# Patient Record
Sex: Female | Born: 1935 | Race: White | Hispanic: No | Smoking: Never smoker
Health system: Southern US, Community
[De-identification: ages and names within clinical notes are randomized; demographics above are authoritative.]

## PROBLEM LIST (undated history)

## (undated) DIAGNOSIS — I4891 Unspecified atrial fibrillation: Secondary | ICD-10-CM

## (undated) DIAGNOSIS — I639 Cerebral infarction, unspecified: Secondary | ICD-10-CM

## (undated) DIAGNOSIS — I10 Essential (primary) hypertension: Secondary | ICD-10-CM

## (undated) HISTORY — PX: OTHER SURGICAL HISTORY: SHX169

---

## 2000-07-13 ENCOUNTER — Encounter: Admission: RE | Admit: 2000-07-13 | Discharge: 2000-07-13 | Payer: Self-pay | Admitting: Internal Medicine

## 2000-07-13 ENCOUNTER — Encounter: Payer: Self-pay | Admitting: Internal Medicine

## 2001-06-23 ENCOUNTER — Ambulatory Visit (HOSPITAL_COMMUNITY): Admission: RE | Admit: 2001-06-23 | Discharge: 2001-06-23 | Payer: Self-pay | Admitting: Internal Medicine

## 2001-07-22 ENCOUNTER — Encounter: Admission: RE | Admit: 2001-07-22 | Discharge: 2001-07-22 | Payer: Self-pay | Admitting: Internal Medicine

## 2001-07-22 ENCOUNTER — Encounter: Payer: Self-pay | Admitting: Internal Medicine

## 2001-08-30 ENCOUNTER — Encounter: Payer: Self-pay | Admitting: *Deleted

## 2001-08-30 ENCOUNTER — Emergency Department (HOSPITAL_COMMUNITY): Admission: EM | Admit: 2001-08-30 | Discharge: 2001-08-30 | Payer: Self-pay | Admitting: *Deleted

## 2003-01-16 ENCOUNTER — Encounter: Payer: Self-pay | Admitting: Internal Medicine

## 2003-01-16 ENCOUNTER — Encounter: Admission: RE | Admit: 2003-01-16 | Discharge: 2003-01-16 | Payer: Self-pay | Admitting: Internal Medicine

## 2003-06-12 ENCOUNTER — Ambulatory Visit (HOSPITAL_COMMUNITY): Admission: RE | Admit: 2003-06-12 | Discharge: 2003-06-12 | Payer: Self-pay | Admitting: Internal Medicine

## 2004-07-02 ENCOUNTER — Encounter: Admission: RE | Admit: 2004-07-02 | Discharge: 2004-07-02 | Payer: Self-pay | Admitting: Internal Medicine

## 2004-08-09 ENCOUNTER — Encounter: Admission: RE | Admit: 2004-08-09 | Discharge: 2004-08-09 | Payer: Self-pay | Admitting: Internal Medicine

## 2005-07-28 ENCOUNTER — Encounter: Admission: RE | Admit: 2005-07-28 | Discharge: 2005-07-28 | Payer: Self-pay | Admitting: Internal Medicine

## 2006-08-17 ENCOUNTER — Encounter: Admission: RE | Admit: 2006-08-17 | Discharge: 2006-08-17 | Payer: Self-pay | Admitting: Internal Medicine

## 2007-08-25 ENCOUNTER — Encounter: Admission: RE | Admit: 2007-08-25 | Discharge: 2007-08-25 | Payer: Self-pay | Admitting: Internal Medicine

## 2007-09-10 ENCOUNTER — Inpatient Hospital Stay (HOSPITAL_COMMUNITY): Admission: EM | Admit: 2007-09-10 | Discharge: 2007-09-13 | Payer: Self-pay | Admitting: Emergency Medicine

## 2008-08-25 ENCOUNTER — Encounter: Admission: RE | Admit: 2008-08-25 | Discharge: 2008-08-25 | Payer: Self-pay | Admitting: Internal Medicine

## 2009-08-02 ENCOUNTER — Encounter (INDEPENDENT_AMBULATORY_CARE_PROVIDER_SITE_OTHER): Payer: Self-pay | Admitting: Internal Medicine

## 2009-08-02 ENCOUNTER — Ambulatory Visit (HOSPITAL_COMMUNITY): Admission: RE | Admit: 2009-08-02 | Discharge: 2009-08-02 | Payer: Self-pay | Admitting: Internal Medicine

## 2011-05-13 NOTE — Discharge Summary (Signed)
Sarah Stone, Sarah Stone              ACCOUNT NO.:  [id number]   MEDICAL RECORD NO.:  [id number]          PATIENT TYPE:  INP   LOCATION:  1602                         FACILITY:  [facility]   PHYSICIAN:  Madelynn Done, MD  DATE OF BIRTH:  [birth year]   DATE OF ADMISSION:  09/10/2007  DATE OF DISCHARGE:  09/13/2007                               DISCHARGE SUMMARY   DISCHARGE DIAGNOSES:  1. Right distal radius fracture.  2. Hypertension.  3. Neurofibromatosis.   ADMITTING DIAGNOSES:  1. Right distal radius fracture.  2. Hypertension.  3. Neurofibromatosis.   PROCEDURES AND DATES:  Open treatment of right distal radius fracture  with internal fixation on 09/11/07.   DISCHARGE MEDICATIONS:  1. Vicodin 5/500 one to two tablets orally q. 4 to 6 h as needed for      pain.  2. Colace 100 mg orally b.i.d..  3. Multivitamin 1 tablet orally daily.   REASON FOR ADMISSION:  The patient is an 77 year old, right-hand  dominant female who sustained a fall onto her outstretched right wrist  on 09/10/07. The patient was seen in the emergency department and  underwent closed manipulation of her right wrist. Due to the instability  of the fracture and persistent displacement, the patient was admitted to  the hospital to undergo surgery on 09/11/07.   HOSPITAL COURSE:  The patient was admitted to the hospital for planned  surgery. The patient underwent surgery on 09/11/07. She tolerated this  well under general anesthesia.   The patient was seen and examined throughout her hospital course. She  remained afebrile. Her vital signs were stable and normal. She was  tolerating a regular diet. Her pain was controlled on oral pain  medications. She was voiding well. She did not have any acute signs  prior to discharge.   On the day of discharge the patient was afebrile. Her vital signs were  stable, normal and felt ready to be discharged to home.   CONDITION ON DISCHARGE:  Good.   RECOMMENDATIONS AND  DISPOSITION:  She is going to be followed up in the  office in approximately 10 days. She is to keep her splint on at all  times, keep her hand elevated above her heart. She is instructed to call  the office and make a followup appointment. I spoke to her son-in-law  today about the arrangements. Will set up home health services for gait  assistance as well as home aid and  OT therapy.   She is instructed to return to clinic sooner if she has fever, chills,  nausea, vomiting, worsening pain or problems with that right wrist. All  questions were addressed.      Madelynn Done, MD  Electronically Signed     FWO/MEDQ  D:  09/13/2007  T:  09/14/2007  Job:  [id number]

## 2011-05-13 NOTE — Op Note (Signed)
Sarah Stone, Sarah Stone              ACCOUNT NO.:  [id number]   MEDICAL RECORD NO.:  [id number]          PATIENT TYPE:  INP   LOCATION:  1602                         FACILITY:  [facility]   PHYSICIAN:  Sarah Done, MD  DATE OF BIRTH:  [birth year]   DATE OF PROCEDURE:  09/11/2007  DATE OF DISCHARGE:                               OPERATIVE REPORT   PREOPERATIVE DIAGNOSES:  1. Right displaced distal radius fracture.  2. Right ulnar styloid fracture.  3. Neurofibromatosis.   POSTOPERATIVE DIAGNOSES:  1. Right displaced distal radius fracture.  2. Right ulnar styloid fracture.  3. Neurofibromatosis.   ATTENDING SURGEON:  Sarah Stone, M.D. who was scrubbed and  present for the entire procedure.   ASSISTANT SURGEON:  None.   PROCEDURE:  1. Open treatment of right displaced distal radius fracture of three      or more fragments with internal fixation.  2. Closed treatment of right ulnar styloid fracture.  3. Radiograph, three views of right wrist.   SURGICAL IMPLANTS:  1. [employer] volar plate with locking pegs distally and three      3.5 bicortical screws proximally.  2. 5 mL of Grafton demineralized bone matrix bone substitute.   ANESTHESIA:  General via LMA.   TOURNIQUET TIME:  70 minutes at  250 mmHg.   DRAINS:  One #7 TLS drain.   SURGICAL INDICATIONS:  Sarah Stone is an 77 year old right-hand-  dominant female who is otherwise healthy with neurofibromatosis and  hypertension who fell on her right wrist yesterday.  I saw and evaluated  the patient in the emergency department and performed a closed  manipulation of her right wrist.  This was unsuccessful in maintaining  reduction, given the instability in dorsal comminution.  After post-  reduction showed continued displacement and loss of radial height as  well as disruption in the distal radioulnar joint, the patient was  consented for surgery.  The risks, benefits and alternatives were  discussed in  detail with the patient, and signed informed consent was  obtained on the day of surgery.   Preoperative note was Stone prior to surgery, outlining the risks,  benefits, and alternatives of surgery.   DESCRIPTION OF PROCEDURE:  The patient was properly identified in the  preoperative holding area.  A mark with a permanent marker was made on  the right wrist to indicate the correct operative site.  The patient was  then brought back to the operating room, placed supine on the operating  room table, and general anesthesia was administered via LMA.  The  patient tolerated this well.  The patient received preoperative  antibiotics prior to any skin incisions.  A well-padded tourniquet was  then placed on the right brachium and sealed with a 1000 drape.  The  right upper extremity was then prepped and draped in sterile fashion.  A  timeout was called, the correct side was identified, and the surgical  procedure was then begun.   Because the patient did have multiple neurofibromas on her right upper  extremity, a curvilinear incision was then made on the  forearm to avoid  the neurofibromas directly over the FCR tendon sheath.  The limb was  then elevated using Esmarch exsanguination and the tourniquet  insufflated to 250 mmHg.  A standard FCR approach was used to the distal  radius.  Dissection was carried down through the skin and subcutaneous  tissue, and the FCR tendon was then identified.  The finger traps in 10  pounds of traction were used to aid in the exposure as well as the  reduction.  The FCR sheath was then opened proximally and distally.  The  FPL tendon was then identified and retracted.  Dissection was carried  down through the pronator quadratus.  There was very good healthy tissue  of the pronator quadratus, and a flap was not raised as the entire  fracture site was exposed.  After exposure of the fracture, a portion of  the brachial radialis were then released off the radial  styloid all the  way until exposure of the first dorsal compartment tendons.  After the  tenotomy of the brachial radialis, the fracture site was then opened up  with a Therapist, nutritional and then 5 mL of demineralized bone matrix were  then packed dorsally into the defect.  There was felt to be good  placement of the bone graft in the defect.  After the bone grafting was  then completed, an open reduction was then achieved.  There was good  alignment after open reduction and good alignment of the volar cortices.  The standard right distal [employer] distal radius plate was then  applied.  It was then held temporarily in place with a K-wire distally  as well as one 3.5 screw proximally in the oblong hole.  Its position  was then confirmed using the mini C-arm and felt to be in adequate  position, with good restoration of their radial height, length, and  tilt.  After the temporary fixation was in place, then attention was  then turned to placement of the distal locking pegs.  Attention was then  first turned  ulnarly where the ulnar-sided pegs were then placed after  the appropriate drill and measurement.  All the peg holes were then  filled distally.  After placement of all the pegs, attention was then  turned up proximally where two more 3.5 bicortical screws were then  placed.  The traction was let off the hand prior to placement of the 3.5  bicortical locking screws.  After placement of all the implants, the  wound was then thoroughly irrigated.  What was left over of the bone  graft material was then packed around the radial styloid and then  dorsally.  Final mini C-arm images were then used to confirm the  reduction.  Live fluoroscopy was then used and the wrist was placed  through a range of motion with good stability.  The tourniquet was then  deflated.  Hemostasis was then obtained with direct pressure.  There was  not any significant arterial bleeding.  The wound was then  thoroughly  irrigated once again.  A TLS drain was then placed in the deep soft  tissues.  The subcutaneous tissues were then closed with a 4-0 Monocryl.  The skin was then closed with 4-0 horizontal mattress nylon sutures.  After fixation of the distal radius, attention was then turned to the  distal radioulnar joint.  The wrist was then placed in neutral  supination and pronation, and the distal radioulnar joint was assessed.  There was  not any gross instability in neutral supination or pronation,  but they were felt to be a more stable in slight supination.  It was  there felt to immobilize the wrist and forearm in supination.   Adaptic dressing was then applied over the wound.  A sterile compressive  dressing was then applied.  20 mL of 0.25% Marcaine were infiltrated  into the wound for local anesthesia.  The patient was then placed in a  well-padded sugar-tong splint to immobilize in slight supination.  The  patient was then extubated and taken to the recovery room in good  condition.   INTRAOPERATIVE FINDINGS:  The patient did have a displaced distal radius  fracture that was felt amenable to open reduction and internal fixation.  She had relatively good bone quality.  After fixation of distal radius,  the distal radioulnar joint was assessed and there was not any gross  instability in supination and neutral pronation but fell more stable in  slight supination.   Intraoperative radiographs, three views of the right wrist, were  obtained which do show the volar fixation in place.  There did not  appear to be any penetration in the articular surface, with overall good  alignment of the plate as well as restoration of the radial height,  inclination, and tilt.   POSTOPERATIVE PLAN:  The patient will be admitted overnight for  antibiotics and pain control.  She will be seen back in the office in 10  days to look at her wound and likely suture removal and will then go  back into a  long-arm immobilization, keeping the forearm in slight  supination.  Then we will see her back at the 3-week mark, and x-rays  out of the splint and likely transition to a short-arm splint for total  immobilization for 6 weeks and then likely x-rays at the 6-week mark and  then likely out of the splint and then down to therapy to begin some  motion.      Sarah Done, MD  Electronically Signed     FWO/MEDQ  D:  09/11/2007  T:  09/12/2007  Job:  [id number] [id number]

## 2011-10-10 LAB — DIFFERENTIAL
Basophils Relative: 0
Lymphocytes Relative: 11 — ABNORMAL LOW
Lymphs Abs: 1
Monocytes Absolute: 0.4
Monocytes Relative: 4
Neutro Abs: 7.8 — ABNORMAL HIGH
Neutrophils Relative %: 85 — ABNORMAL HIGH

## 2011-10-10 LAB — PROTIME-INR
INR: 1.1
Prothrombin Time: 14

## 2011-10-10 LAB — BASIC METABOLIC PANEL
Calcium: 9
Creatinine, Ser: 0.61
GFR calc Af Amer: >60
GFR calc non Af Amer: >60
Sodium: 137

## 2011-10-10 LAB — CBC
Hemoglobin: 12.4
RBC: 3.82 — ABNORMAL LOW
WBC: 9.2

## 2011-10-10 LAB — APTT: aPTT: 29

## 2015-02-23 ENCOUNTER — Emergency Department (HOSPITAL_COMMUNITY): Payer: Medicare Other

## 2015-02-23 ENCOUNTER — Inpatient Hospital Stay (HOSPITAL_COMMUNITY)
Admission: EM | Admit: 2015-02-23 | Discharge: 2015-02-27 | DRG: 352 | Disposition: A | Discharge status: Home / Self Care | Payer: Medicare Other | Attending: General Surgery | Admitting: General Surgery

## 2015-02-23 ENCOUNTER — Encounter (HOSPITAL_COMMUNITY): Payer: Self-pay | Admitting: Emergency Medicine

## 2015-02-23 DIAGNOSIS — K4130 Unilateral femoral hernia, with obstruction, without gangrene, not specified as recurrent: Principal | ICD-10-CM | POA: Diagnosis present

## 2015-02-23 DIAGNOSIS — D649 Anemia, unspecified: Secondary | ICD-10-CM | POA: Diagnosis present

## 2015-02-23 DIAGNOSIS — I10 Essential (primary) hypertension: Secondary | ICD-10-CM | POA: Diagnosis present

## 2015-02-23 DIAGNOSIS — R109 Unspecified abdominal pain: Secondary | ICD-10-CM | POA: Diagnosis not present

## 2015-02-23 DIAGNOSIS — I482 Chronic atrial fibrillation: Secondary | ICD-10-CM | POA: Diagnosis present

## 2015-02-23 DIAGNOSIS — E876 Hypokalemia: Secondary | ICD-10-CM | POA: Diagnosis not present

## 2015-02-23 DIAGNOSIS — Z7901 Long term (current) use of anticoagulants: Secondary | ICD-10-CM

## 2015-02-23 DIAGNOSIS — K460 Unspecified abdominal hernia with obstruction, without gangrene: Secondary | ICD-10-CM

## 2015-02-23 HISTORY — DX: Essential (primary) hypertension: I10

## 2015-02-23 LAB — CBC WITH DIFFERENTIAL/PLATELET
BASOS ABS: 0 10*3/uL (ref 0.0–0.1)
BASOS PCT: 1 % (ref 0–1)
EOS PCT: 1 % (ref 0–5)
Eosinophils Absolute: 0.1 10*3/uL (ref 0.0–0.7)
HEMATOCRIT: 35.8 % — AB (ref 36.0–46.0)
Hemoglobin: 12.1 g/dL (ref 12.0–15.0)
LYMPHS PCT: 16 % (ref 12–46)
Lymphs Abs: 1 10*3/uL (ref 0.7–4.0)
MCH: 33 pg (ref 26.0–34.0)
MCHC: 33.8 g/dL (ref 30.0–36.0)
MCV: 97.5 fL (ref 78.0–100.0)
Monocytes Absolute: 0.4 10*3/uL (ref 0.1–1.0)
Monocytes Relative: 7 % (ref 3–12)
NEUTROS ABS: 4.9 10*3/uL (ref 1.7–7.7)
Neutrophils Relative %: 75 % (ref 43–77)
PLATELETS: 269 10*3/uL (ref 150–400)
RBC: 3.67 MIL/uL — ABNORMAL LOW (ref 3.87–5.11)
RDW: 14.2 % (ref 11.5–15.5)
WBC: 6.5 10*3/uL (ref 4.0–10.5)

## 2015-02-23 LAB — I-STAT CG4 LACTIC ACID, ED: LACTIC ACID, VENOUS: 1.36 mmol/L (ref 0.5–2.0)

## 2015-02-23 MED ORDER — MORPHINE SULFATE 4 MG/ML IJ SOLN
4.0000 mg | Freq: Once | INTRAMUSCULAR | Status: AC
  Administered 2015-02-24: 4 mg via INTRAVENOUS
  Filled 2015-02-23: qty 1

## 2015-02-23 MED ORDER — ONDANSETRON HCL 4 MG/2ML IJ SOLN
4.0000 mg | Freq: Once | INTRAMUSCULAR | Status: AC
  Administered 2015-02-24: 4 mg via INTRAVENOUS
  Filled 2015-02-23: qty 2

## 2015-02-23 NOTE — ED Provider Notes (Signed)
CSN: [id number]     Arrival date & time 02/23/15  2224 History   First MD Initiated Contact with Patient 02/23/15 2243     Chief Complaint  Patient presents with  . Abdominal Pain      HPI Comments: 71 YOF presents after acute onset of abdominal pain. Pt states that she was sitting in her recliner tonight when she started to experience abdominal cramping and nausea; no vomitting. She also notes the appearance of a firm, tender, mass in her left inguinal area. She states the abdominal pain is persistent and is slowly increasing in intensity; denies radiation of symptoms. Worse with palpation or movement. Denies previous surgical history, trauma, changes in bowel or bladder function, or similar symptoms previously. She notes two normal bowel movements with no signs of blood; shortly after last BM she started to experience abdominal bloating and cessation of flatus  She reports a history of hypertension that is normally well controlled with oral atenolol.   Patient is a 81 y.o. female presenting with abdominal pain.  Abdominal Pain   Past Medical History  Diagnosis Date  . Hypertension    No past surgical history on file. No family history on file. History  Substance Use Topics  . Smoking status: Never Smoker   . Smokeless tobacco: Never Used  . Alcohol Use: No   OB History    No data available     Review of Systems  Gastrointestinal: Positive for abdominal pain.  All other systems reviewed and are negative.    Allergies  Review of patient's allergies indicates no known allergies.  Home Medications   Prior to Admission medications   Not on File   BP 218/98 mmHg  Pulse 73  Resp 20  SpO2 98% Physical Exam  Constitutional: She is oriented to person, place, and time. She appears well-developed and well-nourished.  HENT:  Head: Normocephalic and atraumatic.  Eyes: Pupils are equal, round, and reactive to light.  Neck: Normal range of motion. Neck supple. No JVD present.  No tracheal deviation present. No thyromegaly present.  Cardiovascular: Normal rate, normal heart sounds and intact distal pulses.  An irregularly irregular rhythm present. Exam reveals no gallop and no friction rub.   No murmur heard. Pulmonary/Chest: Effort normal and breath sounds normal. No stridor. No respiratory distress. She has no wheezes. She has no rales. She exhibits no tenderness.  Abdominal: Soft. Normal appearance. She exhibits mass. She exhibits no ascites and no pulsatile midline mass. There is tenderness in the left lower quadrant.  Firm, non-mobile, tender mass, non-reducible mass to left lower inguinal area  Musculoskeletal: Normal range of motion.  Lymphadenopathy:    She has no cervical adenopathy.  Neurological: She is alert and oriented to person, place, and time. Coordination normal.  Skin: Skin is warm and dry.  Psychiatric: She has a normal mood and affect. Her behavior is normal. Judgment and thought content normal.  Nursing note and vitals reviewed.   ED Course  Procedures (including critical care time) Labs Review Labs Reviewed  CBC WITH DIFFERENTIAL/PLATELET  COMPREHENSIVE METABOLIC PANEL  LIPASE, BLOOD  I-STAT CG4 LACTIC ACID, ED    Imaging Review No results found.   EKG Interpretation   Date/Time:  Friday February 23 2015 23:27:37 EST Ventricular Rate:  77 PR Interval:    QRS Duration: 91 QT Interval:  422 QTC Calculation: 478 R Axis:   -75 Text Interpretation:  Atrial fibrillation Confirmed by [facility]  MD,  APRIL ([id number]) on 02/23/2015 11:36:44  PM      MDM   Final diagnoses:  Abdominal pain, acute  Abdominal pain, acute   Pt found to have new onset atrial fibrillation   Pt evaluated and promptly sent for abdominal films based on exam. Air fluid levels noted on x-ray. Her pain continued to increase and was treated wit morphine and Zofran. Pt experienced increased nausea and started to have a decreased in BP and O2  saturation.  She was moved to resuscitation  room for further management. NG tube placed successfully   CT scan showed left inguinal hernia resulting in proximal small bowel obstruction.  At 1:31 AM Dr. Johna SheriffHoxworth was consulted and he agreed to evaluate pt.  Pt care was continued by April K Palumbo-Rasch, MD     Kelle DartingJeffrey Todd Jigar Zielke, PA-C 02/24/15 [id number]  April K Palumbo-Rasch, MD 02/24/15 [phone]

## 2015-02-23 NOTE — ED Notes (Signed)
Pt arrived to the ED with a complaint of abdominal pain.  Pt states that she noticed pain around 2000 hours.  Pt also has a lump on the left lwoer abdomen/ upper pelvis area that is firm to the touch.  Pt states she has pain in the upper abdomen. Pt is hypertensive in initial triage.  Pt is on medication for said.

## 2015-02-24 ENCOUNTER — Encounter (HOSPITAL_COMMUNITY): Payer: Self-pay | Admitting: Emergency Medicine

## 2015-02-24 ENCOUNTER — Emergency Department (HOSPITAL_COMMUNITY): Payer: Medicare Other | Admitting: Certified Registered"

## 2015-02-24 ENCOUNTER — Encounter (HOSPITAL_COMMUNITY): Admission: EM | Disposition: A | Discharge status: Home / Self Care | Payer: Self-pay | Source: Home / Self Care

## 2015-02-24 DIAGNOSIS — I482 Chronic atrial fibrillation: Secondary | ICD-10-CM | POA: Diagnosis not present

## 2015-02-24 DIAGNOSIS — D649 Anemia, unspecified: Secondary | ICD-10-CM | POA: Diagnosis not present

## 2015-02-24 DIAGNOSIS — Z7901 Long term (current) use of anticoagulants: Secondary | ICD-10-CM | POA: Diagnosis not present

## 2015-02-24 DIAGNOSIS — I10 Essential (primary) hypertension: Secondary | ICD-10-CM | POA: Diagnosis not present

## 2015-02-24 DIAGNOSIS — K4130 Unilateral femoral hernia, with obstruction, without gangrene, not specified as recurrent: Principal | ICD-10-CM

## 2015-02-24 DIAGNOSIS — I4891 Unspecified atrial fibrillation: Secondary | ICD-10-CM | POA: Diagnosis not present

## 2015-02-24 DIAGNOSIS — R109 Unspecified abdominal pain: Secondary | ICD-10-CM | POA: Diagnosis present

## 2015-02-24 DIAGNOSIS — I481 Persistent atrial fibrillation: Secondary | ICD-10-CM

## 2015-02-24 DIAGNOSIS — E876 Hypokalemia: Secondary | ICD-10-CM | POA: Diagnosis not present

## 2015-02-24 HISTORY — PX: INGUINAL HERNIA REPAIR: SHX194

## 2015-02-24 LAB — URINALYSIS, ROUTINE W REFLEX MICROSCOPIC
Bilirubin Urine: NEGATIVE
GLUCOSE, UA: NEGATIVE mg/dL
Hgb urine dipstick: NEGATIVE
Ketones, ur: NEGATIVE mg/dL
Leukocytes, UA: NEGATIVE
Nitrite: NEGATIVE
PH: 7.5 (ref 5.0–8.0)
PROTEIN: NEGATIVE mg/dL
Specific Gravity, Urine: 1.026 (ref 1.005–1.030)
Urobilinogen, UA: 0.2 mg/dL (ref 0.0–1.0)

## 2015-02-24 LAB — COMPREHENSIVE METABOLIC PANEL
ALBUMIN: 4.1 g/dL (ref 3.5–5.2)
ALK PHOS: 70 U/L (ref 39–117)
ALT: 13 U/L (ref 0–35)
AST: 26 U/L (ref 0–37)
Anion gap: 10 (ref 5–15)
BILIRUBIN TOTAL: 0.6 mg/dL (ref 0.3–1.2)
BUN: 14 mg/dL (ref 6–23)
CO2: 29 mmol/L (ref 19–32)
CREATININE: 0.61 mg/dL (ref 0.50–1.10)
Calcium: 8.9 mg/dL (ref 8.4–10.5)
Chloride: 96 mmol/L (ref 96–112)
GFR calc Af Amer: 87 mL/min — ABNORMAL LOW (ref >90)
GFR calc non Af Amer: 75 mL/min — ABNORMAL LOW (ref >90)
Glucose, Bld: 159 mg/dL — ABNORMAL HIGH (ref 70–99)
Potassium: 3.3 mmol/L — ABNORMAL LOW (ref 3.5–5.1)
Sodium: 135 mmol/L (ref 135–145)
TOTAL PROTEIN: 7.4 g/dL (ref 6.0–8.3)

## 2015-02-24 LAB — LIPASE, BLOOD: LIPASE: 39 U/L (ref 11–59)

## 2015-02-24 LAB — POC OCCULT BLOOD, ED: FECAL OCCULT BLD: NEGATIVE

## 2015-02-24 LAB — BRAIN NATRIURETIC PEPTIDE: B Natriuretic Peptide: 189 pg/mL — ABNORMAL HIGH (ref 0.0–100.0)

## 2015-02-24 SURGERY — REPAIR, HERNIA, INGUINAL, INCARCERATED
Anesthesia: General | Site: Groin | Laterality: Left

## 2015-02-24 MED ORDER — METOPROLOL TARTRATE 1 MG/ML IV SOLN
5.0000 mg | Freq: Four times a day (QID) | INTRAVENOUS | Status: DC
  Administered 2015-02-24: 5 mg via INTRAVENOUS
  Filled 2015-02-24 (×2): qty 5

## 2015-02-24 MED ORDER — BUPIVACAINE-EPINEPHRINE 0.25% -1:200000 IJ SOLN
INTRAMUSCULAR | Status: DC | PRN
  Administered 2015-02-24: 20 mL

## 2015-02-24 MED ORDER — LACTATED RINGERS IV SOLN
INTRAVENOUS | Status: DC | PRN
  Administered 2015-02-24 (×2): via INTRAVENOUS

## 2015-02-24 MED ORDER — ONDANSETRON HCL 4 MG/2ML IJ SOLN
INTRAMUSCULAR | Status: AC
  Filled 2015-02-24: qty 2

## 2015-02-24 MED ORDER — IOHEXOL 300 MG/ML  SOLN
50.0000 mL | Freq: Once | INTRAMUSCULAR | Status: AC | PRN
  Administered 2015-02-24: 50 mL via ORAL

## 2015-02-24 MED ORDER — 0.9 % SODIUM CHLORIDE (POUR BTL) OPTIME
TOPICAL | Status: DC | PRN
  Administered 2015-02-24: 1000 mL

## 2015-02-24 MED ORDER — MORPHINE SULFATE 2 MG/ML IJ SOLN
1.0000 mg | INTRAMUSCULAR | Status: DC | PRN
  Administered 2015-02-24: 1 mg via INTRAVENOUS
  Filled 2015-02-24: qty 1

## 2015-02-24 MED ORDER — HYDROCODONE-ACETAMINOPHEN 5-325 MG PO TABS: 1.0000 | ORAL_TABLET | ORAL | Status: DC | PRN

## 2015-02-24 MED ORDER — HYDROMORPHONE HCL 1 MG/ML IJ SOLN: 0.2500 mg | INTRAMUSCULAR | Status: DC | PRN

## 2015-02-24 MED ORDER — LATANOPROST 0.005 % OP SOLN: 1.0000 [drp] | Freq: Every morning | OPHTHALMIC | Status: DC

## 2015-02-24 MED ORDER — LACTATED RINGERS IV SOLN: INTRAVENOUS | Status: DC

## 2015-02-24 MED ORDER — LIDOCAINE HCL (PF) 2 % IJ SOLN
INTRAMUSCULAR | Status: DC | PRN
  Administered 2015-02-24: 20 mg via INTRADERMAL

## 2015-02-24 MED ORDER — HEPARIN SODIUM (PORCINE) 5000 UNIT/ML IJ SOLN
5000.0000 [IU] | Freq: Three times a day (TID) | INTRAMUSCULAR | Status: DC
Start: 2015-02-24 — End: 2015-02-24
  Administered 2015-02-24 (×2): 5000 [IU] via SUBCUTANEOUS
  Filled 2015-02-24 (×2): qty 1

## 2015-02-24 MED ORDER — CEFAZOLIN SODIUM-DEXTROSE 2-3 GM-% IV SOLR
2.0000 g | Freq: Once | INTRAVENOUS | Status: AC
  Administered 2015-02-24: 2 g via INTRAVENOUS
  Filled 2015-02-24: qty 50

## 2015-02-24 MED ORDER — LIDOCAINE HCL (CARDIAC) 20 MG/ML IV SOLN
INTRAVENOUS | Status: AC
  Filled 2015-02-24: qty 5

## 2015-02-24 MED ORDER — PROPOFOL 10 MG/ML IV BOLUS
INTRAVENOUS | Status: AC
  Filled 2015-02-24: qty 20

## 2015-02-24 MED ORDER — LATANOPROST 0.005 % OP SOLN
1.0000 [drp] | Freq: Every morning | OPHTHALMIC | Status: DC
  Administered 2015-02-24 – 2015-02-27 (×4): 1 [drp] via OPHTHALMIC
  Filled 2015-02-24: qty 2.5

## 2015-02-24 MED ORDER — KCL IN DEXTROSE-NACL 20-5-0.9 MEQ/L-%-% IV SOLN
INTRAVENOUS | Status: DC
  Administered 2015-02-24 – 2015-02-25 (×4): via INTRAVENOUS
  Filled 2015-02-24 (×5): qty 1000

## 2015-02-24 MED ORDER — CEFAZOLIN (ANCEF) 1 G IV SOLR: 2.0000 g | INTRAVENOUS | Status: DC

## 2015-02-24 MED ORDER — ONDANSETRON HCL 4 MG/2ML IJ SOLN: 4.0000 mg | Freq: Four times a day (QID) | INTRAMUSCULAR | Status: DC | PRN

## 2015-02-24 MED ORDER — GLYCOPYRROLATE 0.2 MG/ML IJ SOLN
INTRAMUSCULAR | Status: DC | PRN
  Administered 2015-02-24: 0.4 mg via INTRAVENOUS

## 2015-02-24 MED ORDER — FENTANYL CITRATE 0.05 MG/ML IJ SOLN
INTRAMUSCULAR | Status: AC
  Filled 2015-02-24: qty 2

## 2015-02-24 MED ORDER — ROCURONIUM BROMIDE 100 MG/10ML IV SOLN
INTRAVENOUS | Status: DC | PRN
  Administered 2015-02-24: 20 mg via INTRAVENOUS

## 2015-02-24 MED ORDER — FENTANYL CITRATE 0.05 MG/ML IJ SOLN
INTRAMUSCULAR | Status: DC | PRN
  Administered 2015-02-24: 100 ug via INTRAVENOUS

## 2015-02-24 MED ORDER — PHENYLEPHRINE HCL 10 MG/ML IJ SOLN
INTRAMUSCULAR | Status: DC | PRN
  Administered 2015-02-24 (×4): 80 ug via INTRAVENOUS

## 2015-02-24 MED ORDER — PHENYLEPHRINE 40 MCG/ML (10ML) SYRINGE FOR IV PUSH (FOR BLOOD PRESSURE SUPPORT)
PREFILLED_SYRINGE | INTRAVENOUS | Status: AC
  Filled 2015-02-24: qty 10

## 2015-02-24 MED ORDER — ONDANSETRON HCL 4 MG PO TABS: 4.0000 mg | ORAL_TABLET | Freq: Four times a day (QID) | ORAL | Status: DC | PRN

## 2015-02-24 MED ORDER — BUPIVACAINE-EPINEPHRINE 0.25% -1:200000 IJ SOLN
INTRAMUSCULAR | Status: AC
  Filled 2015-02-24: qty 1

## 2015-02-24 MED ORDER — NEOSTIGMINE METHYLSULFATE 10 MG/10ML IV SOLN
INTRAVENOUS | Status: DC | PRN
  Administered 2015-02-24: 3.5 mg via INTRAVENOUS

## 2015-02-24 MED ORDER — ONDANSETRON HCL 4 MG/2ML IJ SOLN
INTRAMUSCULAR | Status: DC | PRN
  Administered 2015-02-24: 4 mg via INTRAVENOUS

## 2015-02-24 MED ORDER — IOHEXOL 300 MG/ML  SOLN
100.0000 mL | Freq: Once | INTRAMUSCULAR | Status: AC | PRN
  Administered 2015-02-24: 100 mL via INTRAVENOUS

## 2015-02-24 MED ORDER — APIXABAN 2.5 MG PO TABS
2.5000 mg | ORAL_TABLET | Freq: Two times a day (BID) | ORAL | Status: DC
  Administered 2015-02-24 – 2015-02-27 (×6): 2.5 mg via ORAL
  Filled 2015-02-24 (×6): qty 1

## 2015-02-24 MED ORDER — APIXABAN 2.5 MG PO TABS: 2.5000 mg | ORAL_TABLET | Freq: Two times a day (BID) | ORAL | Status: DC

## 2015-02-24 MED ORDER — OXYCODONE HCL 5 MG PO TABS: 5.0000 mg | ORAL_TABLET | Freq: Once | ORAL | Status: DC | PRN

## 2015-02-24 MED ORDER — PROPOFOL 10 MG/ML IV BOLUS
INTRAVENOUS | Status: DC | PRN
  Administered 2015-02-24: 90 mg via INTRAVENOUS

## 2015-02-24 MED ORDER — PROMETHAZINE HCL 25 MG/ML IJ SOLN: 6.2500 mg | INTRAMUSCULAR | Status: DC | PRN

## 2015-02-24 MED ORDER — HYDROCHLOROTHIAZIDE 25 MG PO TABS
25.0000 mg | ORAL_TABLET | Freq: Every day | ORAL | Status: DC
  Administered 2015-02-25 – 2015-02-27 (×3): 25 mg via ORAL
  Filled 2015-02-24 (×3): qty 1

## 2015-02-24 MED ORDER — GLYCOPYRROLATE 0.2 MG/ML IJ SOLN
INTRAMUSCULAR | Status: AC
  Filled 2015-02-24: qty 2

## 2015-02-24 MED ORDER — OXYCODONE HCL 5 MG/5ML PO SOLN
5.0000 mg | Freq: Once | ORAL | Status: DC | PRN
  Filled 2015-02-24: qty 5

## 2015-02-24 MED ORDER — METOPROLOL TARTRATE 1 MG/ML IV SOLN
2.5000 mg | Freq: Four times a day (QID) | INTRAVENOUS | Status: DC
  Administered 2015-02-24 – 2015-02-25 (×3): 2.5 mg via INTRAVENOUS
  Filled 2015-02-24 (×3): qty 5

## 2015-02-24 MED ORDER — ROCURONIUM BROMIDE 100 MG/10ML IV SOLN
INTRAVENOUS | Status: AC
  Filled 2015-02-24: qty 1

## 2015-02-24 SURGICAL SUPPLY — 16 items
ADH SKN CLS APL DERMABOND .7 (GAUZE/BANDAGES/DRESSINGS) ×1
DERMABOND ADVANCED (GAUZE/BANDAGES/DRESSINGS) ×2
DERMABOND ADVANCED .7 DNX12 (GAUZE/BANDAGES/DRESSINGS) IMPLANT
DRAPE INCISE IOBAN 66X45 STRL (DRAPES) ×2 IMPLANT
DRAPE UTILITY 15X26 TOWEL STRL (DRAPES) ×2 IMPLANT
GOWN BRE IMP SLV AUR XL STRL (GOWN DISPOSABLE) ×2 IMPLANT
GOWN STRL NON-REIN LRG LVL3 (GOWN DISPOSABLE) ×2 IMPLANT
MESH PROLENE 3X6 (Mesh General) ×2 IMPLANT
PACK GENERAL/GYN (CUSTOM PROCEDURE TRAY) ×3 IMPLANT
SUT MNCRL AB 4-0 PS2 18 (SUTURE) ×2 IMPLANT
SUT VIC AB 3-0 SH 27 (SUTURE) ×3
SUT VIC AB 3-0 SH 27XBRD (SUTURE) IMPLANT
SYRINGE CONTROL L 12CC (SYRINGE) ×3 IMPLANT
SYRINGE CONTROL LL 12CC (SYRINGE) IMPLANT
TOWEL NATURAL 10PK STERILE (DISPOSABLE) ×2 IMPLANT
TRAY FOLEY BAG SILVER LF 14FR (CATHETERS) ×2 IMPLANT

## 2015-02-24 NOTE — Progress Notes (Signed)
Patient ID: Sarah Stone, female   DOB: [birth year], 81 y.o.   MRN: [id number] Day of Surgery  Subjective: No C/O.  Denies pain.  No CP or SOB  Objective: Vital signs in last 24 hours: Temp:  [96.9 F (36.1 C)-97.8 F (36.6 C)] 97.3 F (36.3 C) (02/27 0631) Pulse Rate:  [54-84] 65 (02/27 0631) Resp:  [12-23] 14 (02/27 0631) BP: (160-218)/(79-99) 184/90 mmHg (02/27 0631) SpO2:  [84 %-98 %] 91 % (02/27 0631)    Intake/Output from previous day: 02/26 0701 - 02/27 0700 In: 1450 [I.V.:1450] Out: 225 [Urine:225] Intake/Output this shift: Total I/O In: -  Out: 350 [Urine:350]  General appearance: alert, cooperative and no distress Resp: clear to auscultation bilaterally Cardio: irregularly irregular rhythm GI: normal findings: soft, non-tender Incision/Wound: Clean and dry  Lab Results:   Recent Labs  02/23/15 2305  WBC 6.5  HGB 12.1  HCT 35.8*  PLT 269   BMET  Recent Labs  02/23/15 2305  NA 135  K 3.3*  CL 96  CO2 29  GLUCOSE 159*  BUN 14  CREATININE 0.61  CALCIUM 8.9     Studies/Results: Ct Abdomen Pelvis W Contrast  02/24/2015   CLINICAL DATA:  81 year old female with abdominal pain and vomiting. Pain for 5 hours. Lump left lower abdominal/ upper pelvis. Rule out strangulated hernia.  EXAM: CT ABDOMEN AND PELVIS WITH CONTRAST  TECHNIQUE: Multidetector CT imaging of the abdomen and pelvis was performed using the standard protocol following bolus administration of intravenous contrast.  CONTRAST:  OMNIPAQUE IOHEXOL 300 MG/ML SOLN, 50mL OMNIPAQUE IOHEXOL 300 MG/ML SOLN  COMPARISON:  Radiographs 2 hours prior  FINDINGS: Emphysematous changes noted at the lung bases. Bilateral pleural thickening versus diminutive pleural effusions. Mild atelectasis without consolidation. The heart is enlarged.  Stomach is distended with ingested contrast. Enteric tube is in place, tip in the stomach. There are dilated fluid-filled proximal small bowel loops, transition point is  seen within the left inguinal hernia. Small bowel loops distal to this are decompressed. There is no pneumatosis or perforation. Small amount of mesenteric free fluid and free fluid in the pelvis, likely reactive. There is no free air.  There is eventration of the right hemidiaphragm containing normal appearing liver. Within the subcapsular left hepatic lobe is a 13 mm wedge shaped hypodensity. This is not measure simple fluid density. There is question of some fill in on delayed imaging. There is no adjacent 8 mm hypodensity in the left lobe that fills in on delayed imaging. Subcentimeter cysts are seen in the left hepatic dome. The gallbladder is decompressed, question small dependent gallstone. Mild prominence of the central intrahepatic biliary tree, the common bile duct is normal in caliber.  The spleen and adrenal glands are normal. The pancreas is atrophic with borderline mild diffuse pancreatic ductal dilatation of 3 mm. There is no surrounding inflammatory change.  The kidneys demonstrate symmetric enhancement and excretion. There is a partially exophytic cystic lesion arising from the lower right kidney measuring at least 5.2 x 5.2 x 6.8 cm. There is peripheral wall calcification inferiorly. Proximally there is questionable internal septation versus adjacent cyst. There is an adjacent cortical cyst that measures 1.9 cm in the lower pole. Small subcentimeter cyst seen in the left kidney.  Dense atherosclerosis of normal caliber abdominal aorta. No retroperitoneal adenopathy.  Stool throughout the colon with diverticulosis in tortuosity distally. No evidence of diverticulitis. The appendix is normal.  Within the pelvis the bladder is distended. Uterus is atrophic, normal  for age. There is prominent periuterine vascularity and dilatation of the left ovarian vein, unusual for age. No adnexal mass.  Multilevel degenerative change throughout the lumbar spine. Degenerative change in both hips. No focal or acute  osseous abnormality.  IMPRESSION: 1. Left inguinal hernia resulting in proximal small bowel obstruction. Small amount of free fluid is likely reactive. No pneumatosis or perforation. 2. Multiple chronic findings. Include diverticulosis without diverticulitis. 3. Bilateral renal cysts including a 6.8 cm Bosniak 2 cyst from the lower right kidney. 4. Hypodense hepatic lesion in the left lobe measures 1.3 cm. Incompletely characterize, however likely represents a hemangioma given some delayed fill-in. 5. Probable gallstone.  Atherosclerosis.   Electronically Signed   By: Rubye OaksMelanie  Ehinger M.D.   On: 02/24/2015 01:15   Dg Abd Acute W/chest  02/23/2015   CLINICAL DATA:  Abdominal pain this evening throughout. History of hypertension. Nonsmoker.  EXAM: ACUTE ABDOMEN SERIES (ABDOMEN 2 VIEW & CHEST 1 VIEW)  COMPARISON:  Chest 09/10/2007  FINDINGS: Cardiac enlargement without vascular congestion. Calcified granulomas in the lungs with calcified right hilar lymph nodes. No focal airspace disease or consolidation in the lungs. Diffuse emphysematous changes with scattered fibrosis likely representing chronic bronchitis. Segmental elevation of the right hemi diaphragm. No change since previous chest study.  Gas distended small bowel in the mid abdomen with small air-fluid levels. Appearance suggests small bowel obstruction. Stool-filled colon without distention. No free intra-abdominal air. Multiple nodular opacities over the upper abdomen probably represents scan nodules. No radiopaque stones. Degenerative changes in the spine.  IMPRESSION: Emphysematous changes and fibrosis with granulomatous changes in the chest. No evidence of active pulmonary disease.  Gas-filled dilated small bowel in the mid abdomen with air-fluid levels suggesting small bowel obstruction.   Electronically Signed   By: Burman NievesWilliam  Stevens M.D.   On: 02/23/2015 23:52    Anti-infectives: Anti-infectives    Start     Dose/Rate Route Frequency Ordered  Stop   02/24/15 0230  ceFAZolin (ANCEF) powder 2 g  Status:  Discontinued     2 g Other To Surgery 02/24/15 0218 02/24/15 0220   02/24/15 0230  ceFAZolin (ANCEF) IVPB 2 g/50 mL premix     2 g 100 mL/hr over 30 Minutes Intravenous  Once 02/24/15 0221 02/24/15 0315      Assessment/Plan: s/p Procedure(s): HERNIA REPAIR Femoral INCARCERATED with SBO. Doing well.  D/C NGT and leave NPO x meds for now New a fib.  Have asked cardiology to evaluate.  OK for anticoagulation from surgical perspective    LOS: 0 days    Kamdyn Covel T 02/24/2015

## 2015-02-24 NOTE — Anesthesia Procedure Notes (Signed)
Procedure Name: Intubation Date/Time: 02/24/2015 3:21 AM Performed by: Early OsmondEARGLE, Carter Kaman E Pre-anesthesia Checklist: Patient identified, Emergency Drugs available, Suction available and Patient being monitored Patient Re-evaluated:Patient Re-evaluated prior to inductionOxygen Delivery Method: Circle System Utilized Preoxygenation: Pre-oxygenation with 100% oxygen Intubation Type: IV induction, Rapid sequence and Cricoid Pressure applied Ventilation: Mask ventilation without difficulty Laryngoscope Size: Miller and 3 Grade View: Grade I Tube type: Oral Tube size: 7.0 mm Number of attempts: 1 Airway Equipment and Method: Stylet and Oral airway Placement Confirmation: ETT inserted through vocal cords under direct vision,  positive ETCO2 and breath sounds checked- equal and bilateral Secured at: 21 cm Tube secured with: Tape Dental Injury: Teeth and Oropharynx as per pre-operative assessment

## 2015-02-24 NOTE — Anesthesia Postprocedure Evaluation (Signed)
Anesthesia Post Note  Patient: Sarah BaumannRose Stone  Procedure(s) Performed: Procedure(s) (LRB): HERNIA REPAIR INGUINAL INCARCERATED (Left)  Anesthesia type: general  Patient location: PACU  Post pain: Pain level controlled  Post assessment: Patient's Cardiovascular Status Stable  Post vital signs: Reviewed and stable  Level of consciousness: sedated  Complications: No apparent anesthesia complications

## 2015-02-24 NOTE — Anesthesia Postprocedure Evaluation (Signed)
  Anesthesia Post-op Note  Patient: Sarah BaumannRose Lowder  Procedure(s) Performed: Procedure(s) (LRB): HERNIA REPAIR INGUINAL INCARCERATED (Left)  Patient Location: PACU  Anesthesia Type: General  Level of Consciousness: awake and alert   Airway and Oxygen Therapy: Patient Spontanous Breathing  Post-op Pain: mild  Post-op Assessment: Post-op Vital signs reviewed, Patient's Cardiovascular Status Stable, Respiratory Function Stable, Patent Airway and No signs of Nausea or vomiting  Last Vitals:  Filed Vitals:   02/24/15 0945  BP: 156/79  Pulse: 57  Temp:   Resp:     Post-op Vital Signs: stable   Complications: No apparent anesthesia complications

## 2015-02-24 NOTE — Op Note (Signed)
Preoperative Diagnosis: Abdominal pain, acute [R10.0] Incarcerated hernia [K46.0]  Postoprative Diagnosis: Incarcerated left femoral hernia  Procedure: Procedure(s): Repair of incarcerated left femoral hernia   Surgeon: Glenna FellowsHoxworth, Teonia Yager T   Assistants: None  Anesthesia:  General endotracheal - Double lumen tube  Indications: Patient is a 81 year old female who presents with a 5-6 hours of generalized abdominal pain and left groin pain and nausea and vomiting. CT scan has shown small bowel obstruction with an incarcerated hernia in the left groin. I've recommended emergency repair. This was discussed with the patient and her family including indications and nature of surgery and risks of anesthetic complications, bleeding and infection and possible need for bowel resection. They understand and agree to proceed.    Procedure Detail:  Patient was brought to the operating room, placed in the supine position on the operating table, and general endotracheal anesthesia induced. Foley catheter was placed. She was given IV antibiotics. PAS replaced. The abdomen and groins were widely sterilely prepped and draped. Patient timeout was performed and correct procedure verified. An oblique incision was made in the left groin and dissection carried down through subcutaneous tissue and Scarpa's fascia. I dissected down onto the external oblique and carried the dissection down to the inguinal ligament and the hernia mass was presenting below the inguinal ligament through the femoral space. The hernia sac and mass was dissected free from surrounding soft tissue. The hernia sac was opened and there was a short loop of small bowel that was quite dusky but not necrotic. I passed a hemostat between the bowel and the inguinal ligament above and sharply divided a small amount of the inguinal ligament which released the incarceration. The incarcerated small bowel was grasped with a Babcock and reduced into the abdomen.  There was a somewhat bulky hernia sac with preperitoneal fat which was completely excised. The small bowel was then brought out through the defect and had pinked up nicely with no sign of any permanent damage. This was returned to the peritoneal cavity. I fashioned a Prolene plug about 2 cm in length and a centimeter in diameter which was placed in the femoral defect. This was then closed beginning at the pubic tubercle and working laterally closing the inguinal ligament down to the Cooper's ligament and incorporating the plug into the sutures. His was carried out to but carefully avoiding the femoral vein which was identified. There was no bleeding. The wound was irrigated and soft tissue infiltrated with Marcaine. Scarpa's fascia was closed with running 3-0 Vicryl and the skin with running subcuticular 4-0 Monocryl and Dermabond. Sponge and needle of the stomach counts were correct.    Findings: Incarcerated femoral hernia with strangulated but viable small bowel  Estimated Blood Loss:  Minimal         Drains: none  Blood Given: none          Specimens: None        Complications:  * No complications entered in OR log *         Disposition: PACU - hemodynamically stable.         Condition: stable

## 2015-02-24 NOTE — Consult Note (Addendum)
Admit date: 02/23/2015 Referring Physician  Dr. Johna SheriffHoxworth Primary Physician No primary care provider on file. Primary Cardiologist  New Reason for Consultation  atrial fibrillation  HPI: 81 year old female with hernia repair secondary to incarcerated femoral hernia with small bowel obstruction who developed new onset atrial fibrillation. Prior to surgery which occurred on 02/24/15 at 4:28 AM, EKG in the emergency department demonstrated  (02/23/15)-EKG at 2327 demonstrated atrial fibrillation with left axis deviation heart rate 71 bpm. Echocardiogram on 08/02/2009 showed normal ejection fraction, mildly dilated right ventricle with pulmonary pressures 34 mmHg. She states that she has been doing quite well until yesterday after she ate a banana and ice cream. She felt uncomfortable in her left lower quadrant of her abdomen. This worsened in severity and prompted emergency room evaluation which revealed incarcerated hernia/small bowel obstruction.  She denies any chest pain, shortness of breath, syncope, palpitations. She does not recall ever having a history of atrial fibrillation.  No prior cardiovascular history. She does take antihypertensives atenolol and hydrochlorothiazide.  Telemetry revealed occasional heart rates into the 40s during the night. Continued atrial fibrillation.  PMH:   Past Medical History  Diagnosis Date  . Hypertension     PSH:  History reviewed. No pertinent past surgical history. Allergies:  Review of patient's allergies indicates no known allergies. Prior to Admit Meds:   Prior to Admission medications   Medication Sig Start Date End Date Taking? Authorizing Provider  aspirin EC 81 MG tablet Take 81 mg by mouth daily.   Yes Historical Provider, MD  atenolol (TENORMIN) 25 MG tablet Take 25 mg by mouth daily.  01/17/15  Yes Historical Provider, MD  hydrochlorothiazide (HYDRODIURIL) 25 MG tablet Take 25 mg by mouth daily. 12/09/14  Yes Historical Provider, MD    latanoprost (XALATAN) 0.005 % ophthalmic solution Place 1 drop into both eyes every morning.  02/18/15  Yes Historical Provider, MD   Fam HX:   History reviewed. No pertinent family history. Social HX:    History   Social History  . Marital Status: Widowed    Spouse Name: N/A  . Number of Children: N/A  . Years of Education: N/A   Occupational History  . Not on file.   Social History Main Topics  . Smoking status: Never Smoker   . Smokeless tobacco: Never Used  . Alcohol Use: No  . Drug Use: No  . Sexual Activity: No   Other Topics Concern  . Not on file   Social History Narrative     ROS:  Denies any syncope, fevers, orthopnea, PND, chest pain, shortness of breath All 11 ROS were addressed and are negative except what is stated in the HPI   Physical Exam: Blood pressure 160/66, pulse 59, temperature 97.3 F (36.3 C), temperature source Tympanic, resp. rate 16, height 5\' 2"  (1.575 m), weight 132 lb 9.6 oz (60.147 kg), SpO2 94 %.   General: elderly, in no acute distress Head: Eyes PERRLA, No xanthomas.   Normal cephalic and atramatic  Lungs:   Clear bilaterally to auscultation and percussion. Normal respiratory effort. No wheezes, no rales. Heart:  Irregularly irregular S1 S2 normal rate Pulses are 2+ & equal. No murmur, rubs, gallops.  No carotid bruit. No JVD.  No abdominal bruits.  Abdomen: Bowel sounds are hypoactive, abdomen soft and non-tender without masses. No hepatosplenomegaly. Msk:  Back normal. Normal strength and tone for age. Extremities:  No clubbing, cyanosis or edema.  DP +1 Neuro: Alert and oriented X 3,  non-focal, MAE x 4 GU: Deferred Rectal: Deferred Psych:  Good affect, responds appropriately      Labs: Lab Results  Component Value Date   WBC 6.5 02/23/2015   HGB 12.1 02/23/2015   HCT 35.8* 02/23/2015   MCV 97.5 02/23/2015   PLT 269 02/23/2015     Recent Labs Lab 02/23/15 2305  NA 135  K 3.3*  CL 96  CO2 29  BUN 14  CREATININE  0.61  CALCIUM 8.9  PROT 7.4  BILITOT 0.6  ALKPHOS 70  ALT 13  AST 26  GLUCOSE 159*    Radiology:  Ct Abdomen Pelvis W Contrast  02/24/2015   CLINICAL DATA:  81 year old female with abdominal pain and vomiting. Pain for 5 hours. Lump left lower abdominal/ upper pelvis. Rule out strangulated hernia.  EXAM: CT ABDOMEN AND PELVIS WITH CONTRAST  TECHNIQUE: Multidetector CT imaging of the abdomen and pelvis was performed using the standard protocol following bolus administration of intravenous contrast.  CONTRAST:  OMNIPAQUE IOHEXOL 300 MG/ML SOLN, 50mL OMNIPAQUE IOHEXOL 300 MG/ML SOLN  COMPARISON:  Radiographs 2 hours prior  FINDINGS: Emphysematous changes noted at the lung bases. Bilateral pleural thickening versus diminutive pleural effusions. Mild atelectasis without consolidation. The heart is enlarged.  Stomach is distended with ingested contrast. Enteric tube is in place, tip in the stomach. There are dilated fluid-filled proximal small bowel loops, transition point is seen within the left inguinal hernia. Small bowel loops distal to this are decompressed. There is no pneumatosis or perforation. Small amount of mesenteric free fluid and free fluid in the pelvis, likely reactive. There is no free air.  There is eventration of the right hemidiaphragm containing normal appearing liver. Within the subcapsular left hepatic lobe is a 13 mm wedge shaped hypodensity. This is not measure simple fluid density. There is question of some fill in on delayed imaging. There is no adjacent 8 mm hypodensity in the left lobe that fills in on delayed imaging. Subcentimeter cysts are seen in the left hepatic dome. The gallbladder is decompressed, question small dependent gallstone. Mild prominence of the central intrahepatic biliary tree, the common bile duct is normal in caliber.  The spleen and adrenal glands are normal. The pancreas is atrophic with borderline mild diffuse pancreatic ductal dilatation of 3 mm. There  is no surrounding inflammatory change.  The kidneys demonstrate symmetric enhancement and excretion. There is a partially exophytic cystic lesion arising from the lower right kidney measuring at least 5.2 x 5.2 x 6.8 cm. There is peripheral wall calcification inferiorly. Proximally there is questionable internal septation versus adjacent cyst. There is an adjacent cortical cyst that measures 1.9 cm in the lower pole. Small subcentimeter cyst seen in the left kidney.  Dense atherosclerosis of normal caliber abdominal aorta. No retroperitoneal adenopathy.  Stool throughout the colon with diverticulosis in tortuosity distally. No evidence of diverticulitis. The appendix is normal.  Within the pelvis the bladder is distended. Uterus is atrophic, normal for age. There is prominent periuterine vascularity and dilatation of the left ovarian vein, unusual for age. No adnexal mass.  Multilevel degenerative change throughout the lumbar spine. Degenerative change in both hips. No focal or acute osseous abnormality.  IMPRESSION: 1. Left inguinal hernia resulting in proximal small bowel obstruction. Small amount of free fluid is likely reactive. No pneumatosis or perforation. 2. Multiple chronic findings. Include diverticulosis without diverticulitis. 3. Bilateral renal cysts including a 6.8 cm Bosniak 2 cyst from the lower right kidney. 4. Hypodense hepatic lesion  in the left lobe measures 1.3 cm. Incompletely characterize, however likely represents a hemangioma given some delayed fill-in. 5. Probable gallstone.  Atherosclerosis.   Electronically Signed   By: Rubye Oaks M.D.   On: 02/24/2015 01:15   Dg Abd Acute W/chest  02/23/2015   CLINICAL DATA:  Abdominal pain this evening throughout. History of hypertension. Nonsmoker.  EXAM: ACUTE ABDOMEN SERIES (ABDOMEN 2 VIEW & CHEST 1 VIEW)  COMPARISON:  Chest 09/10/2007  FINDINGS: Cardiac enlargement without vascular congestion. Calcified granulomas in the lungs with  calcified right hilar lymph nodes. No focal airspace disease or consolidation in the lungs. Diffuse emphysematous changes with scattered fibrosis likely representing chronic bronchitis. Segmental elevation of the right hemi diaphragm. No change since previous chest study.  Gas distended small bowel in the mid abdomen with small air-fluid levels. Appearance suggests small bowel obstruction. Stool-filled colon without distention. No free intra-abdominal air. Multiple nodular opacities over the upper abdomen probably represents scan nodules. No radiopaque stones. Degenerative changes in the spine.  IMPRESSION: Emphysematous changes and fibrosis with granulomatous changes in the chest. No evidence of active pulmonary disease.  Gas-filled dilated small bowel in the mid abdomen with air-fluid levels suggesting small bowel obstruction.   Electronically Signed   By: Burman Nieves M.D.   On: 02/23/2015 23:52     EKG:  Atrial fibrillation heart rate 77, left anterior fascicular block Personally viewed.   Echocardiogram on 08/02/2009 showed normal ejection fraction, mildly dilated right ventricle with pulmonary pressures 34 mmHg.  ASSESSMENT/PLAN:    81 year old female postop incarcerated hernia with atrial fibrillation, well rate controlled.  1. Atrial fibrillation  - CHADS-VASc - 4 (Age 64, Female, Hypertension) therefore warrants anticoagulation.  - Given her advanced age, despite normal creatinine, I would be an advocate for Eliquis 2.5 mg twice a day.  - There is always a possibility that her atrial fibrillation was triggered by her current acute illness and that over the next 24-48 hours she will spontaneously revert back to sinus rhythm. On the other hand, she may have been in atrial fibrillation for quite some time prior to operative therapy as this was discovered in the emergency room prior to her procedure.  - Risks of bleeding of course need to be taken seriously especially in her advanced age and  recent postoperative state. Dr. Johna Sheriff states in his note that anticoagulation would be reasonable if necessary.  - Continue with rate control strategy. I would not advocate for cardioversion unless highly symptomatic.  - I will decrease her metoprolol from 5 mg IV every 6 hours to 2.5 mg IV every 6 hours. Her heart rate overnight was as low as 40.  - On discharge, she may not require 25 mg of atenolol. He may be able to get away with 12.5 mg once a day.  2. Chronic anticoagulation  - Discussed with her risks of bleeding. Risks of stroke.  - Tried to call son but no answer.   We will continue to follow along. I will be happy to see her as outpatient.  Donato Schultz, MD  02/24/2015  2:08 PM

## 2015-02-24 NOTE — Transfer of Care (Signed)
Immediate Anesthesia Transfer of Care Note  Patient: Sarah BaumannRose Stone  Procedure(s) Performed: Procedure(s) (LRB): HERNIA REPAIR INGUINAL INCARCERATED (Left)  Patient Location: PACU  Anesthesia Type: General  Level of Consciousness: sedated, patient cooperative and responds to stimulation  Airway & Oxygen Therapy: Patient Spontanous Breathing and Patient connected to face mask oxgen  Post-op Assessment: Report given to PACU RN and Post -op Vital signs reviewed and stable  Post vital signs: Reviewed and stable  Complications: No apparent anesthesia complications

## 2015-02-24 NOTE — Anesthesia Preprocedure Evaluation (Addendum)
Anesthesia Evaluation  Patient identified by MRN, date of birth, ID band Patient awake    Reviewed: Allergy & Precautions, NPO status , Patient's Chart, lab work & pertinent test results  History of Anesthesia Complications Negative for: history of anesthetic complications  Airway Mallampati: II  TM Distance: >3 FB Neck ROM: Full    Dental  (+) Teeth Intact, Dental Advisory Given   Pulmonary neg pulmonary ROS,    Pulmonary exam normal       Cardiovascular hypertension, Pt. on medications     Neuro/Psych negative neurological ROS  negative psych ROS   GI/Hepatic negative GI ROS, Neg liver ROS,   Endo/Other  negative endocrine ROS  Renal/GU negative Renal ROS     Musculoskeletal   Abdominal   Peds  Hematology   Anesthesia Other Findings   Reproductive/Obstetrics                            Anesthesia Physical Anesthesia Plan  ASA: III and emergent  Anesthesia Plan: General   Post-op Pain Management:    Induction: Intravenous, Rapid sequence and Cricoid pressure planned  Airway Management Planned: Oral ETT  Additional Equipment:   Intra-op Plan:   Post-operative Plan: Extubation in OR and Possible Post-op intubation/ventilation  Informed Consent: I have reviewed the patients History and Physical, chart, labs and discussed the procedure including the risks, benefits and alternatives for the proposed anesthesia with the patient or authorized representative who has indicated his/her understanding and acceptance.   Dental advisory given  Plan Discussed with: CRNA, Anesthesiologist and Surgeon  Anesthesia Plan Comments:        Anesthesia Quick Evaluation

## 2015-02-24 NOTE — H&P (Signed)
Sarah Stone is an 81 y.o. female.    Chief Complaint: Abdominal pain, nausea  HPI: Patient is a 81 year old female generally very healthy for her age and living independently who about 5 or 6 hours prior to presenting to the emergency department developed the onset of crampy abdominal pain and left groin pain and nausea. She presented to the emergency department and has been found to have an incarcerated left inguinal hernias below.  Past Medical History  Diagnosis Date  . Hypertension        Neurofibromatosis  Surgical history: ORIF wrist fracture  History reviewed. No pertinent family history. Social History:  reports that she has never smoked. She has never used smokeless tobacco. She reports that she does not drink alcohol or use illicit drugs.  Allergies: No Known Allergies   No current facility-administered medications for this encounter.   No current outpatient prescriptions on file.      Results for orders placed or performed during the hospital encounter of 02/23/15 (from the past 48 hour(s))  CBC with Differential/Platelet     Status: Abnormal   Collection Time: 02/23/15 11:05 PM  Result Value Ref Range   WBC 6.5 4.0 - 10.5 K/uL   RBC 3.67 (L) 3.87 - 5.11 MIL/uL   Hemoglobin 12.1 12.0 - 15.0 g/dL   HCT 35.8 (L) 36.0 - 46.0 %   MCV 97.5 78.0 - 100.0 fL   MCH 33.0 26.0 - 34.0 pg   MCHC 33.8 30.0 - 36.0 g/dL   RDW 14.2 11.5 - 15.5 %   Platelets 269 150 - 400 K/uL   Neutrophils Relative % 75 43 - 77 %   Neutro Abs 4.9 1.7 - 7.7 K/uL   Lymphocytes Relative 16 12 - 46 %   Lymphs Abs 1.0 0.7 - 4.0 K/uL   Monocytes Relative 7 3 - 12 %   Monocytes Absolute 0.4 0.1 - 1.0 K/uL   Eosinophils Relative 1 0 - 5 %   Eosinophils Absolute 0.1 0.0 - 0.7 K/uL   Basophils Relative 1 0 - 1 %   Basophils Absolute 0.0 0.0 - 0.1 K/uL  Comprehensive metabolic panel     Status: Abnormal   Collection Time: 02/23/15 11:05 PM  Result Value Ref Range   Sodium 135 135 - 145 mmol/L    Potassium 3.3 (L) 3.5 - 5.1 mmol/L   Chloride 96 96 - 112 mmol/L   CO2 29 19 - 32 mmol/L   Glucose, Bld 159 (H) 70 - 99 mg/dL   BUN 14 6 - 23 mg/dL   Creatinine, Ser 0.61 0.50 - 1.10 mg/dL   Calcium 8.9 8.4 - 10.5 mg/dL   Total Protein 7.4 6.0 - 8.3 g/dL   Albumin 4.1 3.5 - 5.2 g/dL   AST 26 0 - 37 U/L   ALT 13 0 - 35 U/L   Alkaline Phosphatase 70 39 - 117 U/L   Total Bilirubin 0.6 0.3 - 1.2 mg/dL   GFR calc non Af Amer 75 (L) >90 mL/min   GFR calc Af Amer 87 (L) >90 mL/min    Comment: (NOTE) The eGFR has been calculated using the CKD EPI equation. This calculation has not been validated in all clinical situations. eGFR's persistently <90 mL/min signify possible Chronic Kidney Disease.    Anion gap 10 5 - 15  Lipase, blood     Status: None   Collection Time: 02/23/15 11:05 PM  Result Value Ref Range   Lipase 39 11 - 59 U/L  Brain natriuretic peptide     Status: Abnormal   Collection Time: 02/23/15 11:05 PM  Result Value Ref Range   B Natriuretic Peptide 189.0 (H) 0.0 - 100.0 pg/mL  I-Stat CG4 Lactic Acid, ED     Status: None   Collection Time: 02/23/15 11:16 PM  Result Value Ref Range   Lactic Acid, Venous 1.36 0.5 - 2.0 mmol/L  POC occult blood, ED Provider will collect     Status: None   Collection Time: 02/24/15 12:34 AM  Result Value Ref Range   Fecal Occult Bld NEGATIVE NEGATIVE   Ct Abdomen Pelvis W Contrast  02/24/2015   CLINICAL DATA:  81 year old female with abdominal pain and vomiting. Pain for 5 hours. Lump left lower abdominal/ upper pelvis. Rule out strangulated hernia.  EXAM: CT ABDOMEN AND PELVIS WITH CONTRAST  TECHNIQUE: Multidetector CT imaging of the abdomen and pelvis was performed using the standard protocol following bolus administration of intravenous contrast.  CONTRAST:  146m OMNIPAQUE IOHEXOL 300 MG/ML SOLN, 515mOMNIPAQUE IOHEXOL 300 MG/ML SOLN  COMPARISON:  Radiographs 2 hours prior  FINDINGS: Emphysematous changes noted at the lung bases. Bilateral  pleural thickening versus diminutive pleural effusions. Mild atelectasis without consolidation. The heart is enlarged.  Stomach is distended with ingested contrast. Enteric tube is in place, tip in the stomach. There are dilated fluid-filled proximal small bowel loops, transition point is seen within the left inguinal hernia. Small bowel loops distal to this are decompressed. There is no pneumatosis or perforation. Small amount of mesenteric free fluid and free fluid in the pelvis, likely reactive. There is no free air.  There is eventration of the right hemidiaphragm containing normal appearing liver. Within the subcapsular left hepatic lobe is a 13 mm wedge shaped hypodensity. This is not measure simple fluid density. There is question of some fill in on delayed imaging. There is no adjacent 8 mm hypodensity in the left lobe that fills in on delayed imaging. Subcentimeter cysts are seen in the left hepatic dome. The gallbladder is decompressed, question small dependent gallstone. Mild prominence of the central intrahepatic biliary tree, the common bile duct is normal in caliber.  The spleen and adrenal glands are normal. The pancreas is atrophic with borderline mild diffuse pancreatic ductal dilatation of 3 mm. There is no surrounding inflammatory change.  The kidneys demonstrate symmetric enhancement and excretion. There is a partially exophytic cystic lesion arising from the lower right kidney measuring at least 5.2 x 5.2 x 6.8 cm. There is peripheral wall calcification inferiorly. Proximally there is questionable internal septation versus adjacent cyst. There is an adjacent cortical cyst that measures 1.9 cm in the lower pole. Small subcentimeter cyst seen in the left kidney.  Dense atherosclerosis of normal caliber abdominal aorta. No retroperitoneal adenopathy.  Stool throughout the colon with diverticulosis in tortuosity distally. No evidence of diverticulitis. The appendix is normal.  Within the pelvis the  bladder is distended. Uterus is atrophic, normal for age. There is prominent periuterine vascularity and dilatation of the left ovarian vein, unusual for age. No adnexal mass.  Multilevel degenerative change throughout the lumbar spine. Degenerative change in both hips. No focal or acute osseous abnormality.  IMPRESSION: 1. Left inguinal hernia resulting in proximal small bowel obstruction. Small amount of free fluid is likely reactive. No pneumatosis or perforation. 2. Multiple chronic findings. Include diverticulosis without diverticulitis. 3. Bilateral renal cysts including a 6.8 cm Bosniak 2 cyst from the lower right kidney. 4. Hypodense hepatic lesion in the left  lobe measures 1.3 cm. Incompletely characterize, however likely represents a hemangioma given some delayed fill-in. 5. Probable gallstone.  Atherosclerosis.   Electronically Signed   By: Jeb Levering M.D.   On: 02/24/2015 01:15   Dg Abd Acute W/chest  02/23/2015   CLINICAL DATA:  Abdominal pain this evening throughout. History of hypertension. Nonsmoker.  EXAM: ACUTE ABDOMEN SERIES (ABDOMEN 2 VIEW & CHEST 1 VIEW)  COMPARISON:  Chest 09/10/2007  FINDINGS: Cardiac enlargement without vascular congestion. Calcified granulomas in the lungs with calcified right hilar lymph nodes. No focal airspace disease or consolidation in the lungs. Diffuse emphysematous changes with scattered fibrosis likely representing chronic bronchitis. Segmental elevation of the right hemi diaphragm. No change since previous chest study.  Gas distended small bowel in the mid abdomen with small air-fluid levels. Appearance suggests small bowel obstruction. Stool-filled colon without distention. No free intra-abdominal air. Multiple nodular opacities over the upper abdomen probably represents scan nodules. No radiopaque stones. Degenerative changes in the spine.  IMPRESSION: Emphysematous changes and fibrosis with granulomatous changes in the chest. No evidence of active  pulmonary disease.  Gas-filled dilated small bowel in the mid abdomen with air-fluid levels suggesting small bowel obstruction.   Electronically Signed   By: Lucienne Capers M.D.   On: 02/23/2015 23:52    Review of Systems  Constitutional: Negative for fever and chills.  Respiratory: Negative.   Cardiovascular: Negative.   Gastrointestinal: Positive for nausea, vomiting and abdominal pain.    Blood pressure 218/98, pulse 73, resp. rate 20, SpO2 98 %. Physical Exam  General: Alert, well-developed Elderly Caucasian female, in no distress Skin: Warm and dry without rash or infection. Scattered subcutaneous nodules skin of trunk and extremities HEENT: No palpable masses or thyromegaly. Sclera nonicteric. Pupils equal round and reactive.  Lymph nodes: No cervical, supraclavicular, or inguinal nodes palpable. Lungs: Breath sounds clear and equal without increased work of breathing Cardiovascular: Irregular rhythm without murmur. No JVD or edema. Peripheral pulses intact. Abdomen: Moderately distended. Mild diffuse tenderness.. Soft and nontender. No masses palpable. No organomegaly. Tender palpable mass in the left inguinal canal consistent with incarcerated hernia that is not reducible with firm continued pressure. Extremities: No edema or joint swelling or deformity. No chronic venous stasis changes. Neurologic: Alert and fully oriented.   Assessment/Plan Incarcerated left inguinal hernia with small bowel obstruction. Not reducible in the emergency department. She will require emergency repair. I discussed this with the patient and the family. We discussed the indications and risks of bleeding and infection and possible need for small bowel resection. We discussed anesthetic risks. Hypertension New-onset atrial fibrillation, rate okay. We will consult cardiology postoperatively.  Inocencio Roy T 02/24/2015, 2:18 AM

## 2015-02-25 DIAGNOSIS — I10 Essential (primary) hypertension: Secondary | ICD-10-CM

## 2015-02-25 LAB — BASIC METABOLIC PANEL
ANION GAP: 10 (ref 5–15)
BUN: 12 mg/dL (ref 6–23)
CALCIUM: 7.8 mg/dL — AB (ref 8.4–10.5)
CO2: 23 mmol/L (ref 19–32)
CREATININE: 0.52 mg/dL (ref 0.50–1.10)
Chloride: 98 mmol/L (ref 96–112)
GFR calc Af Amer: >90 mL/min (ref >90)
GFR calc non Af Amer: 79 mL/min — ABNORMAL LOW (ref >90)
GLUCOSE: 137 mg/dL — AB (ref 70–99)
Potassium: 3.3 mmol/L — ABNORMAL LOW (ref 3.5–5.1)
SODIUM: 131 mmol/L — AB (ref 135–145)

## 2015-02-25 LAB — CBC
HCT: 35 % — ABNORMAL LOW (ref 36.0–46.0)
HEMOGLOBIN: 12 g/dL (ref 12.0–15.0)
MCH: 32.6 pg (ref 26.0–34.0)
MCHC: 34.3 g/dL (ref 30.0–36.0)
MCV: 95.1 fL (ref 78.0–100.0)
Platelets: 303 10*3/uL (ref 150–400)
RBC: 3.68 MIL/uL — ABNORMAL LOW (ref 3.87–5.11)
RDW: 14.2 % (ref 11.5–15.5)
WBC: 12.3 10*3/uL — AB (ref 4.0–10.5)

## 2015-02-25 LAB — TSH: TSH: 3.6 u[IU]/mL (ref 0.350–4.500)

## 2015-02-25 MED ORDER — METOPROLOL TARTRATE 1 MG/ML IV SOLN
5.0000 mg | Freq: Four times a day (QID) | INTRAVENOUS | Status: DC
  Administered 2015-02-25 – 2015-02-26 (×4): 5 mg via INTRAVENOUS
  Filled 2015-02-25 (×5): qty 5

## 2015-02-25 NOTE — Progress Notes (Signed)
SATURATION QUALIFICATIONS: (This note is used to comply with regulatory documentation for home oxygen)  Patient Saturations on Room Air at Rest =90%  Patient Saturations on Room Air while Ambulating = 86%  Patient Saturations on 2 Liters of oxygen while Ambulating =89%  Please briefly explain why patient needs home oxygen: Shortness of breath with activity and Oxygen saturation down in the 80s during ambulation

## 2015-02-25 NOTE — Discharge Instructions (Addendum)
Information on my medicine - ELIQUIS (apixaban)  This medication education was reviewed with me or my healthcare representative as part of my discharge preparation.  The pharmacist that spoke with me during my hospital stay was:  Laurence SlateMack, Jessica, [facility]  Why was Eliquis prescribed for you? Eliquis was prescribed for you to reduce the risk of a blood clot forming that can cause a stroke if you have a medical condition called atrial fibrillation (a type of irregular heartbeat).  What do You need to know about Eliquis ? Take your Eliquis TWICE DAILY - one tablet in the morning and one tablet in the evening with or without food. If you have difficulty swallowing the tablet whole please discuss with your pharmacist how to take the medication safely.  Take Eliquis exactly as prescribed by your doctor and DO NOT stop taking Eliquis without talking to the doctor who prescribed the medication.  Stopping may increase your risk of developing a stroke.  Refill your prescription before you run out.  After discharge, you should have regular check-up appointments with your healthcare provider that is prescribing your Eliquis.  In the future your dose may need to be changed if your kidney function or weight changes by a significant amount or as you get older.  What do you do if you miss a dose? If you miss a dose, take it as soon as you remember on the same day and resume taking twice daily.  Do not take more than one dose of ELIQUIS at the same time to make up a missed dose.  Important Safety Information A possible side effect of Eliquis is bleeding. You should call your healthcare provider right away if you experience any of the following: ? Bleeding from an injury or your nose that does not stop. ? Unusual colored urine (red or dark brown) or unusual colored stools (red or black). ? Unusual bruising for unknown reasons. ? A serious fall or if you hit your head (even if there is no bleeding).  Some  medicines may interact with Eliquis and might increase your risk of bleeding or clotting while on Eliquis. To help avoid this, consult your healthcare provider or pharmacist prior to using any new prescription or non-prescription medications, including herbals, vitamins, non-steroidal anti-inflammatory drugs (NSAIDs) and supplements.  This website has more information on Eliquis (apixaban): http://www.eliquis.com/eliquis/home  [facility]      [facility], [place] [phone]  OPEN ABDOMINAL SURGERY: POST OP INSTRUCTIONS  Always review your discharge instruction sheet given to you by the facility where your surgery was performed.  IF YOU HAVE DISABILITY OR FAMILY LEAVE FORMS, YOU MUST BRING THEM TO THE OFFICE FOR PROCESSING.  PLEASE DO NOT GIVE THEM TO YOUR DOCTOR.  1. A prescription for pain medication may be given to you upon discharge.  Take your pain medication as prescribed, if needed.  If narcotic pain medicine is not needed, then you may take acetaminophen (Tylenol) or ibuprofen (Advil) as needed. 2. Take your usually prescribed medications unless otherwise directed. 3. If you need a refill on your pain medication, please contact your pharmacy. They will contact our office to request authorization.  Prescriptions will not be filled after 5pm or on week-ends. 4. You should follow a light diet the first few days after arrival home, such as soup and crackers, pudding, etc.unless your doctor has advised otherwise. A high-fiber, low fat diet can be resumed as tolerated.   Be sure to include lots of fluids daily. Most patients will  experience some swelling and bruising on the chest and neck area.  Ice packs will help.  Swelling and bruising can take several days to resolve 5. Most patients will experience some swelling and bruising in the area of the incision. Ice pack will help. Swelling and bruising can take several days to resolve..  6. It is common to experience some constipation if taking  pain medication after surgery.  Increasing fluid intake and taking a stool softener will usually help or prevent this problem from occurring.  A mild laxative (Milk of Magnesia or Miralax) should be taken according to package directions if there are no bowel movements after 48 hours. 7.  You may have steri-strips (small skin tapes) in place directly over the incision.  These strips should be left on the skin for 7-10 days.  If your surgeon used skin glue on the incision, you may shower in 24 hours.  The glue will flake off over the next 2-3 weeks.  Any sutures or staples will be removed at the office during your follow-up visit. You may find that a light gauze bandage over your incision may keep your staples from being rubbed or pulled. You may shower and replace the bandage daily. 8. ACTIVITIES:  You may resume regular (light) daily activities beginning the next day--such as daily self-care, walking, climbing stairs--gradually increasing activities as tolerated.  You may have sexual intercourse when it is comfortable.  Refrain from any heavy lifting or straining until approved by your doctor. a. You may drive when you no longer are taking prescription pain medication, you can comfortably wear a seatbelt, and you can safely maneuver your car and apply brakes b. Return to Work: ___________________________________ 9. You should see your doctor in the office for a follow-up appointment approximately two weeks after your surgery.  Make sure that you call for this appointment within a day or two after you arrive home to insure a convenient appointment time. OTHER INSTRUCTIONS:  _____________________________________________________________ _____________________________________________________________  WHEN TO CALL YOUR DOCTOR: 1. Fever over 101.0 2. Inability to urinate 3. Nausea and/or vomiting 4. Extreme swelling or bruising 5. Continued bleeding from incision. 6. Increased pain, redness, or drainage  from the incision. 7. Difficulty swallowing or breathing 8. Muscle cramping or spasms. 9. Numbness or tingling in hands or feet or around lips.  The clinic staff is available to answer your questions during regular business hours.  Please dont hesitate to call and ask to speak to one of the nurses if you have concerns.  For further questions, please visit [email or web address]

## 2015-02-25 NOTE — Progress Notes (Signed)
Patient ID: Sarah BaumannRose Stone, female   DOB: 05/26/1918, 81 y.o.   MRN: [id number] 1 Day Post-Op  Subjective: No Complaints this morning. Up in chair. She denies nausea or bloating or pain. No flatus or bowel movements.  Objective: Vital signs in last 24 hours: Temp:  [98 F (36.7 C)-98.5 F (36.9 C)] 98 F (36.7 C) (02/28 0507) Pulse Rate:  [59-96] 85 (02/28 0955) Resp:  [16-20] 20 (02/28 0507) BP: (158-168)/(66-91) 158/69 mmHg (02/28 0955) SpO2:  [92 %-96 %] 92 % (02/28 0507) Last BM Date: 02/22/15  Intake/Output from previous day: 02/27 0701 - 02/28 0700 In: 2543.3 [P.O.:120; I.V.:2423.3] Out: 1720 [Urine:1720] Intake/Output this shift:    General appearance: alert, cooperative and no distress GI: normal findings: soft, non-tender Incision/Wound: clean and dry  Lab Results:   Recent Labs  02/23/15 2305 02/25/15 0508  WBC 6.5 12.3*  HGB 12.1 12.0  HCT 35.8* 35.0*  PLT 269 303   BMET  Recent Labs  02/23/15 2305 02/25/15 0508  NA 135 131*  K 3.3* 3.3*  CL 96 98  CO2 29 23  GLUCOSE 159* 137*  BUN 14 12  CREATININE 0.61 0.52  CALCIUM 8.9 7.8*     Studies/Results: Ct Abdomen Pelvis W Contrast  02/24/2015   CLINICAL DATA:  81 year old female with abdominal pain and vomiting. Pain for 5 hours. Lump left lower abdominal/ upper pelvis. Rule out strangulated hernia.  EXAM: CT ABDOMEN AND PELVIS WITH CONTRAST  TECHNIQUE: Multidetector CT imaging of the abdomen and pelvis was performed using the standard protocol following bolus administration of intravenous contrast.  CONTRAST:  100mL OMNIPAQUE IOHEXOL 300 MG/ML SOLN, 50mL OMNIPAQUE IOHEXOL 300 MG/ML SOLN  COMPARISON:  Radiographs 2 hours prior  FINDINGS: Emphysematous changes noted at the lung bases. Bilateral pleural thickening versus diminutive pleural effusions. Mild atelectasis without consolidation. The heart is enlarged.  Stomach is distended with ingested contrast. Enteric tube is in place, tip in the stomach.  There are dilated fluid-filled proximal small bowel loops, transition point is seen within the left inguinal hernia. Small bowel loops distal to this are decompressed. There is no pneumatosis or perforation. Small amount of mesenteric free fluid and free fluid in the pelvis, likely reactive. There is no free air.  There is eventration of the right hemidiaphragm containing normal appearing liver. Within the subcapsular left hepatic lobe is a 13 mm wedge shaped hypodensity. This is not measure simple fluid density. There is question of some fill in on delayed imaging. There is no adjacent 8 mm hypodensity in the left lobe that fills in on delayed imaging. Subcentimeter cysts are seen in the left hepatic dome. The gallbladder is decompressed, question small dependent gallstone. Mild prominence of the central intrahepatic biliary tree, the common bile duct is normal in caliber.  The spleen and adrenal glands are normal. The pancreas is atrophic with borderline mild diffuse pancreatic ductal dilatation of 3 mm. There is no surrounding inflammatory change.  The kidneys demonstrate symmetric enhancement and excretion. There is a partially exophytic cystic lesion arising from the lower right kidney measuring at least 5.2 x 5.2 x 6.8 cm. There is peripheral wall calcification inferiorly. Proximally there is questionable internal septation versus adjacent cyst. There is an adjacent cortical cyst that measures 1.9 cm in the lower pole. Small subcentimeter cyst seen in the left kidney.  Dense atherosclerosis of normal caliber abdominal aorta. No retroperitoneal adenopathy.  Stool throughout the colon with diverticulosis in tortuosity distally. No evidence of diverticulitis. The appendix is  normal.  Within the pelvis the bladder is distended. Uterus is atrophic, normal for age. There is prominent periuterine vascularity and dilatation of the left ovarian vein, unusual for age. No adnexal mass.  Multilevel degenerative change  throughout the lumbar spine. Degenerative change in both hips. No focal or acute osseous abnormality.  IMPRESSION: 1. Left inguinal hernia resulting in proximal small bowel obstruction. Small amount of free fluid is likely reactive. No pneumatosis or perforation. 2. Multiple chronic findings. Include diverticulosis without diverticulitis. 3. Bilateral renal cysts including a 6.8 cm Bosniak 2 cyst from the lower right kidney. 4. Hypodense hepatic lesion in the left lobe measures 1.3 cm. Incompletely characterize, however likely represents a hemangioma given some delayed fill-in. 5. Probable gallstone.  Atherosclerosis.   Electronically Signed   By: Rubye Oaks M.D.   On: 02/24/2015 01:15   Dg Abd Acute W/chest  02/23/2015   CLINICAL DATA:  Abdominal pain this evening throughout. History of hypertension. Nonsmoker.  EXAM: ACUTE ABDOMEN SERIES (ABDOMEN 2 VIEW & CHEST 1 VIEW)  COMPARISON:  Chest 09/10/2007  FINDINGS: Cardiac enlargement without vascular congestion. Calcified granulomas in the lungs with calcified right hilar lymph nodes. No focal airspace disease or consolidation in the lungs. Diffuse emphysematous changes with scattered fibrosis likely representing chronic bronchitis. Segmental elevation of the right hemi diaphragm. No change since previous chest study.  Gas distended small bowel in the mid abdomen with small air-fluid levels. Appearance suggests small bowel obstruction. Stool-filled colon without distention. No free intra-abdominal air. Multiple nodular opacities over the upper abdomen probably represents scan nodules. No radiopaque stones. Degenerative changes in the spine.  IMPRESSION: Emphysematous changes and fibrosis with granulomatous changes in the chest. No evidence of active pulmonary disease.  Gas-filled dilated small bowel in the mid abdomen with air-fluid levels suggesting small bowel obstruction.   Electronically Signed   By: Burman Nieves M.D.   On: 02/23/2015 23:52     Anti-infectives: Anti-infectives    Start     Dose/Rate Route Frequency Ordered Stop   02/24/15 0230  ceFAZolin (ANCEF) powder 2 g  Status:  Discontinued     2 g Other To Surgery 02/24/15 0218 02/24/15 0220   02/24/15 0230  ceFAZolin (ANCEF) IVPB 2 g/50 mL premix     2 g 100 mL/hr over 30 Minutes Intravenous  Once 02/24/15 0221 02/24/15 0315      Assessment/Plan: s/p Procedure(s): HERNIA REPAIR INGUINAL INCARCERATED Doing well postoperatively without apparent complication. Start clear liquid diet and advance as tolerated  Atrial fibrillation. Cardiology following. Normal rate and mildly hypertensive.  Currently on IV Lopressor. Was on by mouth atenolol preop. I will leave beta blocker orders to cardiology and continue IV Lopressor for now. Has been started on Elaquis    LOS: 1 day    Debany Vantol T 02/25/2015

## 2015-02-25 NOTE — Progress Notes (Signed)
Subjective: Breathign is OK  No CP   Objective: Filed Vitals:   02/24/15 0945 02/24/15 1305 02/24/15 2306 02/25/15 0507  BP: 156/79 160/66 160/91 168/91  Pulse: 57 59 85 96  Temp:   98.5 F (36.9 C) 98 F (36.7 C)  TempSrc:   Oral Oral  Resp:  16 20 20   Height:      Weight:      SpO2:  94% 96% 92%   Weight change:   Intake/Output Summary (Last 24 hours) at 02/25/15 0728 Last data filed at 02/25/15 0700  Gross per 24 hour  Intake 2543.33 ml  Output   1720 ml  Net 823.33 ml    General: Alert, awake, oriented x3, in no acute distress Neck:  JVP is normal Heart: Irregular rate and rhythm, without murmurs, rubs, gallops.  Lungs: Clear to auscultation.  No rales or wheezes. Exemities:  No edema.   Neuro: Grossly intact, nonfocal.  Tele:  Afib  80s to 90s    Lab Results: Results for orders placed or performed during the hospital encounter of 02/23/15 (from the past 24 hour(s))  CBC     Status: Abnormal   Collection Time: 02/25/15  5:08 AM  Result Value Ref Range   WBC 12.3 (H) 4.0 - 10.5 K/uL   RBC 3.68 (L) 3.87 - 5.11 MIL/uL   Hemoglobin 12.0 12.0 - 15.0 g/dL   HCT 16.135.0 (L) 09.636.0 - 04.546.0 %   MCV 95.1 78.0 - 100.0 fL   MCH 32.6 26.0 - 34.0 pg   MCHC 34.3 30.0 - 36.0 g/dL   RDW 40.914.2 81.111.5 - 91.415.5 %   Platelets 303 150 - 400 K/uL  Basic metabolic panel     Status: Abnormal   Collection Time: 02/25/15  5:08 AM  Result Value Ref Range   Sodium 131 (L) 135 - 145 mmol/L   Potassium 3.3 (L) 3.5 - 5.1 mmol/L   Chloride 98 96 - 112 mmol/L   CO2 23 19 - 32 mmol/L   Glucose, Bld 137 (H) 70 - 99 mg/dL   BUN 12 6 - 23 mg/dL   Creatinine, Ser 7.820.52 0.50 - 1.10 mg/dL   Calcium 7.8 (L) 8.4 - 10.5 mg/dL   GFR calc non Af Amer 79 (L) >90 mL/min   GFR calc Af Amer >90 >90 mL/min   Anion gap 10 5 - 15    Studies/Results: Ct Abdomen Pelvis W Contrast  02/24/2015   CLINICAL DATA:  81 year old female with abdominal pain and vomiting. Pain for 5 hours. Lump left lower abdominal/  upper pelvis. Rule out strangulated hernia.  EXAM: CT ABDOMEN AND PELVIS WITH CONTRAST  TECHNIQUE: Multidetector CT imaging of the abdomen and pelvis was performed using the standard protocol following bolus administration of intravenous contrast.  CONTRAST:  100mL OMNIPAQUE IOHEXOL 300 MG/ML SOLN, 50mL OMNIPAQUE IOHEXOL 300 MG/ML SOLN  COMPARISON:  Radiographs 2 hours prior  FINDINGS: Emphysematous changes noted at the lung bases. Bilateral pleural thickening versus diminutive pleural effusions. Mild atelectasis without consolidation. The heart is enlarged.  Stomach is distended with ingested contrast. Enteric tube is in place, tip in the stomach. There are dilated fluid-filled proximal small bowel loops, transition point is seen within the left inguinal hernia. Small bowel loops distal to this are decompressed. There is no pneumatosis or perforation. Small amount of mesenteric free fluid and free fluid in the pelvis, likely reactive. There is no free air.  There is eventration of the right hemidiaphragm containing normal appearing  liver. Within the subcapsular left hepatic lobe is a 13 mm wedge shaped hypodensity. This is not measure simple fluid density. There is question of some fill in on delayed imaging. There is no adjacent 8 mm hypodensity in the left lobe that fills in on delayed imaging. Subcentimeter cysts are seen in the left hepatic dome. The gallbladder is decompressed, question small dependent gallstone. Mild prominence of the central intrahepatic biliary tree, the common bile duct is normal in caliber.  The spleen and adrenal glands are normal. The pancreas is atrophic with borderline mild diffuse pancreatic ductal dilatation of 3 mm. There is no surrounding inflammatory change.  The kidneys demonstrate symmetric enhancement and excretion. There is a partially exophytic cystic lesion arising from the lower right kidney measuring at least 5.2 x 5.2 x 6.8 cm. There is peripheral wall calcification  inferiorly. Proximally there is questionable internal septation versus adjacent cyst. There is an adjacent cortical cyst that measures 1.9 cm in the lower pole. Small subcentimeter cyst seen in the left kidney.  Dense atherosclerosis of normal caliber abdominal aorta. No retroperitoneal adenopathy.  Stool throughout the colon with diverticulosis in tortuosity distally. No evidence of diverticulitis. The appendix is normal.  Within the pelvis the bladder is distended. Uterus is atrophic, normal for age. There is prominent periuterine vascularity and dilatation of the left ovarian vein, unusual for age. No adnexal mass.  Multilevel degenerative change throughout the lumbar spine. Degenerative change in both hips. No focal or acute osseous abnormality.  IMPRESSION: 1. Left inguinal hernia resulting in proximal small bowel obstruction. Small amount of free fluid is likely reactive. No pneumatosis or perforation. 2. Multiple chronic findings. Include diverticulosis without diverticulitis. 3. Bilateral renal cysts including a 6.8 cm Bosniak 2 cyst from the lower right kidney. 4. Hypodense hepatic lesion in the left lobe measures 1.3 cm. Incompletely characterize, however likely represents a hemangioma given some delayed fill-in. 5. Probable gallstone.  Atherosclerosis.   Electronically Signed   By: Rubye Oaks M.D.   On: 02/24/2015 01:15    Medications: Reviewed     @  1.  Atrial fib Remains in Afib  Rates in 90s   WOuld increase b blocker with BP elelvation  Currently on IV given GI status  Will need to transition to po later On apixiban.  (? Absorption)  Would keep for now Check TSH Echo to evaluate LV and RV function. Atrial size    2  HTN  As above  Increase metoprolol  3.  GI  To take sips today     Patient is positive 2 L  (? Complete)  Would cut back on IV fluids to 75 cc per hour  Watch      LOS: 1 day   Dietrich Pates 02/25/2015, 7:28 AM

## 2015-02-25 NOTE — Progress Notes (Signed)
Nutrition Brief Note  Patient identified on the Malnutrition Screening Tool (MST) Report  RD spoke with pt and pt's daughter. States that she has steadily lost weight over the years but are not concerned d/t her advanced age. Pt reports good appetite and eating well. Pt had consumed almost all of her clear liquid tray.  RD encouraged pt to continue her good PO intake.  Wt Readings from Last 15 Encounters:  02/24/15 132 lb 9.6 oz (60.147 kg)    Body mass index is 24.25 kg/(m^2). Patient meets criteria for normal range  based on current BMI.   Current diet order is clear liquids, patient is consuming approximately 75% of meals at this time. Labs and medications reviewed.   No nutrition interventions warranted at this time. If nutrition issues arise, please consult RD.   Sarah FrancoLindsey Aahna Rossa, MS, RD, LDN Pager: [phone] After Hours Pager: [phone]

## 2015-02-26 ENCOUNTER — Encounter (HOSPITAL_COMMUNITY): Payer: Self-pay | Admitting: General Surgery

## 2015-02-26 DIAGNOSIS — I480 Paroxysmal atrial fibrillation: Secondary | ICD-10-CM

## 2015-02-26 DIAGNOSIS — I4891 Unspecified atrial fibrillation: Secondary | ICD-10-CM

## 2015-02-26 LAB — BASIC METABOLIC PANEL
ANION GAP: 7 (ref 5–15)
BUN: 13 mg/dL (ref 6–23)
CALCIUM: 7.8 mg/dL — AB (ref 8.4–10.5)
CO2: 26 mmol/L (ref 19–32)
CREATININE: 0.44 mg/dL — AB (ref 0.50–1.10)
Chloride: 98 mmol/L (ref 96–112)
GFR calc non Af Amer: 84 mL/min — ABNORMAL LOW (ref >90)
Glucose, Bld: 114 mg/dL — ABNORMAL HIGH (ref 70–99)
Potassium: 3.2 mmol/L — ABNORMAL LOW (ref 3.5–5.1)
Sodium: 131 mmol/L — ABNORMAL LOW (ref 135–145)

## 2015-02-26 LAB — CBC
HCT: 31.4 % — ABNORMAL LOW (ref 36.0–46.0)
Hemoglobin: 10.6 g/dL — ABNORMAL LOW (ref 12.0–15.0)
MCH: 32 pg (ref 26.0–34.0)
MCHC: 33.8 g/dL (ref 30.0–36.0)
MCV: 94.9 fL (ref 78.0–100.0)
PLATELETS: 245 10*3/uL (ref 150–400)
RBC: 3.31 MIL/uL — ABNORMAL LOW (ref 3.87–5.11)
RDW: 14.3 % (ref 11.5–15.5)
WBC: 9.8 10*3/uL (ref 4.0–10.5)

## 2015-02-26 MED ORDER — METOPROLOL TARTRATE 25 MG PO TABS
25.0000 mg | ORAL_TABLET | Freq: Two times a day (BID) | ORAL | Status: DC
  Administered 2015-02-26 – 2015-02-27 (×3): 25 mg via ORAL
  Filled 2015-02-26 (×3): qty 1

## 2015-02-26 MED ORDER — PHENOL 1.4 % MT LIQD
1.0000 | OROMUCOSAL | Status: DC | PRN
  Filled 2015-02-26: qty 177

## 2015-02-26 MED ORDER — MENTHOL 3 MG MT LOZG
1.0000 | LOZENGE | OROMUCOSAL | Status: DC | PRN
  Administered 2015-02-26: 3 mg via ORAL
  Filled 2015-02-26 (×4): qty 9

## 2015-02-26 MED ORDER — POTASSIUM CHLORIDE CRYS ER 20 MEQ PO TBCR
40.0000 meq | EXTENDED_RELEASE_TABLET | Freq: Once | ORAL | Status: AC
  Administered 2015-02-26: 40 meq via ORAL
  Filled 2015-02-26: qty 2

## 2015-02-26 NOTE — Progress Notes (Signed)
2 Days Post-Op  Subjective: Doing well. C/o sore throat. Denies n/v. +flatus. IS about 800  Objective: Vital signs in last 24 hours: Temp:  [98.2 F (36.8 C)-98.8 F (37.1 C)] 98.8 F (37.1 C) (02/29 0416) Pulse Rate:  [75-95] 95 (02/29 1100) Resp:  [18-20] 20 (02/29 0416) BP: (128-151)/(58-90) 151/75 mmHg (02/29 1100) SpO2:  [91 %-98 %] 94 % (02/29 0416) Last BM Date: 02/22/15  Intake/Output from previous day: 02/28 0701 - 02/29 0700 In: 993.3 [P.O.:240; I.V.:753.3] Out: 1775 [Urine:1775] Intake/Output this shift: Total I/O In: 630.8 [I.V.:630.8] Out: 175 [Urine:175]  Alert, approp; sitting in chair Normal resp effort, cta Soft, nt, nd, incision c/d/i No edema  Lab Results:   Recent Labs  02/25/15 0508 02/26/15 0500  WBC 12.3* 9.8  HGB 12.0 10.6*  HCT 35.0* 31.4*  PLT 303 245   BMET  Recent Labs  02/25/15 0508 02/26/15 0500  NA 131* 131*  K 3.3* 3.2*  CL 98 98  CO2 23 26  GLUCOSE 137* 114*  BUN 12 13  CREATININE 0.52 0.44*  CALCIUM 7.8* 7.8*   PT/INR No results for input(s): LABPROT, INR in the last 72 hours. ABG No results for input(s): PHART, HCO3 in the last 72 hours.  Invalid input(s): PCO2, PO2  Studies/Results: No results found.  Anti-infectives: Anti-infectives    Start     Dose/Rate Route Frequency Ordered Stop   02/24/15 0230  ceFAZolin (ANCEF) powder 2 g  Status:  Discontinued     2 g Other To Surgery 02/24/15 0218 02/24/15 0220   02/24/15 0230  ceFAZolin (ANCEF) IVPB 2 g/50 mL premix     2 g 100 mL/hr over 30 Minutes Intravenous  Once 02/24/15 0221 02/24/15 0315      Assessment/Plan: s/p Procedure(s): HERNIA REPAIR INGUINAL INCARCERATED (Left)  cv- convert iv lopressor to oral per cards; echo pending; rate ok; on oral anticoagulant F/E/N- heart diet; KVO ivf; replace potassium for hypokalemia Anemia - hgb down some from yesterday. Repeat cbc in am pulm toilet Chloraseptic spray  Home next 1-2 days  Mary SellaEric M. Andrey CampanileWilson,  MD, FACS General, Bariatric, & Minimally Invasive Surgery [facility] [place]   LOS: 2 days    Atilano InaWILSON,Marlean Mortell M 02/26/2015

## 2015-02-26 NOTE — Progress Notes (Signed)
  Echocardiogram 2D Echocardiogram has been performed.  [name], [name] 02/26/2015, 2:31 PM

## 2015-02-26 NOTE — Progress Notes (Signed)
SUBJECTIVE:  Sore throat. No shortness of breath. Wants to go home if possible.  She has pain with swallowing.  OBJECTIVE:   Vitals:   Filed Vitals:   02/25/15 1359 02/25/15 1730 02/25/15 2032 02/26/15 0416  BP: 151/79 140/88 150/67 128/90  Pulse: 75 90 91 92  Temp: 98.3 F (36.8 C)  98.2 F (36.8 C) 98.8 F (37.1 C)  TempSrc: Oral  Oral Oral  Resp: Height:      Weight:      SpO2: 98% 95% 96% 94%   I&O's:   Intake/Output Summary (Last 24 hours) at 02/26/15 1610 Last data filed at 02/26/15 9604  Gross per 24 hour  Intake 1624.16 ml  Output   1950 ml  Net -325.84 ml   TELEMETRY: Reviewed telemetry pt in AFib, rate controlled. :     PHYSICAL EXAM General: Well developed, well nourished, in no acute distress Head:   Normal cephalic and atramatic  Lungs:  No wheezing. Heart:  irregularly irregular S1 S2  No JVD.   Abdomen: abdomen soft and non-tender Msk:  Back normal,  Normal strength and tone for age. Extremities:   No edema.   Neuro: Alert  Psych:  Anxious affect, responds appropriately Skin: No rash   LABS: Basic Metabolic Panel:  Recent Labs  54/09/81 0508 02/26/15 0500  NA 131* 131*  K 3.3* 3.2*  CL 98 98  CO2 23 26  GLUCOSE 137* 114*  BUN 12 13  CREATININE 0.52 0.44*  CALCIUM 7.8* 7.8*   Liver Function Tests:  Recent Labs  02/23/15 2305  AST 26  ALT 13  ALKPHOS 70  BILITOT 0.6  PROT 7.4  ALBUMIN 4.1    Recent Labs  02/23/15 2305  LIPASE 39   CBC:  Recent Labs  02/23/15 2305 02/25/15 0508 02/26/15 0500  WBC 6.5 12.3* 9.8  NEUTROABS 4.9  --   --   HGB 12.1 12.0 10.6*  HCT 35.8* 35.0* 31.4*  MCV 97.5 95.1 94.9  PLT 269 303 245   Cardiac Enzymes: No results for input(s): CKTOTAL, CKMB, CKMBINDEX, TROPONINI in the last 72 hours. BNP: Invalid input(s): POCBNP D-Dimer: No results for input(s): DDIMER in the last 72 hours. Hemoglobin A1C: No results for input(s): HGBA1C in the last 72 hours. Fasting Lipid  Panel: No results for input(s): CHOL, HDL, LDLCALC, TRIG, CHOLHDL, LDLDIRECT in the last 72 hours. Thyroid Function Tests:  Recent Labs  02/25/15 0956  TSH 3.600   Anemia Panel: No results for input(s): VITAMINB12, FOLATE, FERRITIN, TIBC, IRON, RETICCTPCT in the last 72 hours. Coag Panel:   Lab Results  Component Value Date   INR 1.1 09/10/2007    RADIOLOGY: Ct Abdomen Pelvis W Contrast  02/24/2015   CLINICAL DATA:  80 year old female with abdominal pain and vomiting. Pain for 5 hours. Lump left lower abdominal/ upper pelvis. Rule out strangulated hernia.  EXAM: CT ABDOMEN AND PELVIS WITH CONTRAST  TECHNIQUE: Multidetector CT imaging of the abdomen and pelvis was performed using the standard protocol following bolus administration of intravenous contrast.  CONTRAST:  OMNIPAQUE IOHEXOL 300 MG/ML SOLN, 50mL OMNIPAQUE IOHEXOL 300 MG/ML SOLN  COMPARISON:  Radiographs 2 hours prior  FINDINGS: Emphysematous changes noted at the lung bases. Bilateral pleural thickening versus diminutive pleural effusions. Mild atelectasis without consolidation. The heart is enlarged.  Stomach is distended with ingested contrast. Enteric tube is in place, tip in the stomach. There are dilated fluid-filled proximal small bowel loops, transition point  is seen within the left inguinal hernia. Small bowel loops distal to this are decompressed. There is no pneumatosis or perforation. Small amount of mesenteric free fluid and free fluid in the pelvis, likely reactive. There is no free air.  There is eventration of the right hemidiaphragm containing normal appearing liver. Within the subcapsular left hepatic lobe is a 13 mm wedge shaped hypodensity. This is not measure simple fluid density. There is question of some fill in on delayed imaging. There is no adjacent 8 mm hypodensity in the left lobe that fills in on delayed imaging. Subcentimeter cysts are seen in the left hepatic dome. The gallbladder is decompressed,  question small dependent gallstone. Mild prominence of the central intrahepatic biliary tree, the common bile duct is normal in caliber.  The spleen and adrenal glands are normal. The pancreas is atrophic with borderline mild diffuse pancreatic ductal dilatation of 3 mm. There is no surrounding inflammatory change.  The kidneys demonstrate symmetric enhancement and excretion. There is a partially exophytic cystic lesion arising from the lower right kidney measuring at least 5.2 x 5.2 x 6.8 cm. There is peripheral wall calcification inferiorly. Proximally there is questionable internal septation versus adjacent cyst. There is an adjacent cortical cyst that measures 1.9 cm in the lower pole. Small subcentimeter cyst seen in the left kidney.  Dense atherosclerosis of normal caliber abdominal aorta. No retroperitoneal adenopathy.  Stool throughout the colon with diverticulosis in tortuosity distally. No evidence of diverticulitis. The appendix is normal.  Within the pelvis the bladder is distended. Uterus is atrophic, normal for age. There is prominent periuterine vascularity and dilatation of the left ovarian vein, unusual for age. No adnexal mass.  Multilevel degenerative change throughout the lumbar spine. Degenerative change in both hips. No focal or acute osseous abnormality.  IMPRESSION: 1. Left inguinal hernia resulting in proximal small bowel obstruction. Small amount of free fluid is likely reactive. No pneumatosis or perforation. 2. Multiple chronic findings. Include diverticulosis without diverticulitis. 3. Bilateral renal cysts including a 6.8 cm Bosniak 2 cyst from the lower right kidney. 4. Hypodense hepatic lesion in the left lobe measures 1.3 cm. Incompletely characterize, however likely represents a hemangioma given some delayed fill-in. 5. Probable gallstone.  Atherosclerosis.   Electronically Signed   By: Rubye Oaks M.D.   On: 02/24/2015 01:15   Dg Abd Acute W/chest  02/23/2015   CLINICAL  DATA:  Abdominal pain this evening throughout. History of hypertension. Nonsmoker.  EXAM: ACUTE ABDOMEN SERIES (ABDOMEN 2 VIEW & CHEST 1 VIEW)  COMPARISON:  Chest 09/10/2007  FINDINGS: Cardiac enlargement without vascular congestion. Calcified granulomas in the lungs with calcified right hilar lymph nodes. No focal airspace disease or consolidation in the lungs. Diffuse emphysematous changes with scattered fibrosis likely representing chronic bronchitis. Segmental elevation of the right hemi diaphragm. No change since previous chest study.  Gas distended small bowel in the mid abdomen with small air-fluid levels. Appearance suggests small bowel obstruction. Stool-filled colon without distention. No free intra-abdominal air. Multiple nodular opacities over the upper abdomen probably represents scan nodules. No radiopaque stones. Degenerative changes in the spine.  IMPRESSION: Emphysematous changes and fibrosis with granulomatous changes in the chest. No evidence of active pulmonary disease.  Gas-filled dilated small bowel in the mid abdomen with air-fluid levels suggesting small bowel obstruction.   Electronically Signed   By: Burman Nieves M.D.   On: 02/23/2015 23:52      ASSESSMENT: Roosvelt Harps:    1) AFib: rate controlled on IV  metoprolol.  Change to metoprolol 25 mg PO BID when ok with the surgeons, from a GI perspective.  Low dose Eliquis for stroke prevention.  Normal TSH. Echo pending.  Nl EF in 2010.  2) HTN: Variable control. See how she does with oral metoprolol.  May need to uptitrate.  3) Watch for signs of fluid overload.  Appears euvolemic at this time.  Corky CraftsJayadeep S Nettie Wyffels, MD  02/26/2015  8:24 AM

## 2015-02-27 DIAGNOSIS — I482 Chronic atrial fibrillation: Secondary | ICD-10-CM

## 2015-02-27 LAB — CBC
HCT: 30.6 % — ABNORMAL LOW (ref 36.0–46.0)
Hemoglobin: 10.5 g/dL — ABNORMAL LOW (ref 12.0–15.0)
MCH: 32.6 pg (ref 26.0–34.0)
MCHC: 34.3 g/dL (ref 30.0–36.0)
MCV: 95 fL (ref 78.0–100.0)
Platelets: 262 10*3/uL (ref 150–400)
RBC: 3.22 MIL/uL — ABNORMAL LOW (ref 3.87–5.11)
RDW: 14 % (ref 11.5–15.5)
WBC: 7.7 10*3/uL (ref 4.0–10.5)

## 2015-02-27 LAB — BASIC METABOLIC PANEL
Anion gap: 9 (ref 5–15)
BUN: 14 mg/dL (ref 6–23)
CO2: 26 mmol/L (ref 19–32)
Calcium: 7.9 mg/dL — ABNORMAL LOW (ref 8.4–10.5)
Chloride: 96 mmol/L (ref 96–112)
Creatinine, Ser: 0.57 mg/dL (ref 0.50–1.10)
GFR calc Af Amer: 89 mL/min — ABNORMAL LOW (ref >90)
GFR calc non Af Amer: 77 mL/min — ABNORMAL LOW (ref >90)
Glucose, Bld: 108 mg/dL — ABNORMAL HIGH (ref 70–99)
Potassium: 3.4 mmol/L — ABNORMAL LOW (ref 3.5–5.1)
SODIUM: 131 mmol/L — AB (ref 135–145)

## 2015-02-27 LAB — MAGNESIUM: Magnesium: 1.3 mg/dL — ABNORMAL LOW (ref 1.5–2.5)

## 2015-02-27 MED ORDER — MAGNESIUM SULFATE 2 GM/50ML IV SOLN
2.0000 g | Freq: Once | INTRAVENOUS | Status: AC
  Administered 2015-02-27: 2 g via INTRAVENOUS
  Filled 2015-02-27: qty 50

## 2015-02-27 MED ORDER — HYDROCODONE-ACETAMINOPHEN 5-325 MG PO TABS: 1.0000 | ORAL_TABLET | Freq: Four times a day (QID) | ORAL | Status: DC | PRN

## 2015-02-27 MED ORDER — POTASSIUM CHLORIDE CRYS ER 20 MEQ PO TBCR
40.0000 meq | EXTENDED_RELEASE_TABLET | Freq: Once | ORAL | Status: AC
  Administered 2015-02-27: 40 meq via ORAL
  Filled 2015-02-27: qty 2

## 2015-02-27 MED ORDER — POTASSIUM CHLORIDE ER 10 MEQ PO TBCR: 10.0000 meq | EXTENDED_RELEASE_TABLET | Freq: Every day | ORAL | Status: DC

## 2015-02-27 MED ORDER — APIXABAN 2.5 MG PO TABS: 2.5000 mg | ORAL_TABLET | Freq: Two times a day (BID) | ORAL | Status: DC

## 2015-02-27 MED ORDER — METOPROLOL TARTRATE 25 MG PO TABS
25.0000 mg | ORAL_TABLET | Freq: Two times a day (BID) | ORAL | Status: DC
Start: 2015-02-27 — End: 2016-01-23

## 2015-02-27 NOTE — Evaluation (Signed)
Occupational Therapy Evaluation Patient Details Name: Sarah Stone MRN: [id number] DOB: 05/14/1918 Today's Date: 02/27/2015    History of Present Illness s/p hernia repair   Clinical Impression   Pt admitted with hernia. Pt currently with functional limitations due to the deficits listed below (see OT Problem List).  Pt will benefit from skilled OT to increase their safety and independence with ADL and functional mobility for ADL to facilitate discharge to venue listed below.      Follow Up Recommendations  Home health OT;Supervision - Intermittent    Equipment Recommendations  Other (comment) (rolling walker)    Recommendations for Other Services       Precautions / Restrictions Precautions Precautions: Fall Restrictions Weight Bearing Restrictions: No      Mobility Bed Mobility Overal bed mobility: Needs Assistance Bed Mobility: Supine to Sit     Supine to sit: Supervision        Transfers Overall transfer level: Needs assistance   Transfers: Sit to/from Stand Sit to Stand: Min guard              Balance                                            ADL Overall ADL's : Needs assistance/impaired     Grooming: Standing;Cueing for safety   Upper Body Bathing: Set up;Sitting   Lower Body Bathing: Set up;Sit to/from stand   Upper Body Dressing : Set up;Sitting   Lower Body Dressing: Set up;Sit to/from stand   Toilet Transfer: Min guard;Comfort height toilet   Toileting- ArchitectClothing Manipulation and Hygiene: Min guard;Sit to/from stand       Functional mobility during ADLs: Min guard General ADL Comments: pt will benefit from [facility] and famiily providing initial supervision               Pertinent Vitals/Pain Pain Assessment: No/denies pain     Hand Dominance     Extremity/Trunk Assessment Upper Extremity Assessment Upper Extremity Assessment: Overall WFL for tasks assessed           Communication  Communication Communication: No difficulties   Cognition Arousal/Alertness: Awake/alert Behavior During Therapy: WFL for tasks assessed/performed Overall Cognitive Status: Within Functional Limits for tasks assessed                                Home Living Family/patient expects to be discharged to:: Private residence Living Arrangements: Alone   Type of Home: House       Home Layout: One level     Bathroom Shower/Tub: Chief Strategy OfficerTub/shower unit   Bathroom Toilet: Standard                Prior Functioning/Environment Level of Independence: Independent        Comments: not driving    OT Diagnosis: Generalized weakness   OT Problem List: Decreased strength   OT Treatment/Interventions: Self-care/ADL training;DME and/or AE instruction;Patient/family education    OT Goals(Current goals can be found in the care plan section) Acute Rehab OT Goals Patient Stated Goal: go home OT Goal Formulation: With patient Time For Goal Achievement: 03/13/15 Potential to Achieve Goals: Good ADL Goals Pt Will Perform Lower Body Dressing: with modified independence;sit to/from stand Pt Will Transfer to Toilet: with modified independence;regular height toilet Pt Will Perform Toileting - Clothing Manipulation and  hygiene: with modified independence;sit to/from stand Pt Will Perform Tub/Shower Transfer: Tub transfer;with supervision  OT Frequency: Min 2X/week   Barriers to D/C:            Co-evaluation              End of Session Nurse Communication: Mobility status  Activity Tolerance: Patient tolerated treatment well Patient left: in chair;with call bell/phone within reach;with chair alarm set   Time: 4098-1191 OT Time Calculation (min): 24 min Charges:  OT General Charges $OT Visit: 1 Procedure OT Evaluation $Initial OT Evaluation Tier I: 1 Procedure OT Treatments $Self Care/Home Management : 8-22 mins G-Codes:    Einar Crow D March 28, 2015, 10:10  AM

## 2015-02-27 NOTE — Care Management Note (Signed)
    Page 1 of 2   02/27/2015     3:47:07 PM CARE MANAGEMENT NOTE 02/27/2015  Patient:  Sarah Stone, Sarah Stone   Account Number:  [id number]  Date Initiated:  02/27/2015  Documentation initiated by:  [facility]  Subjective/Objective Assessment:   adm: Incarcerated femoral hernia     Action/Plan:   discharge planning   Anticipated DC Date:  02/27/2015   Anticipated DC Plan:  [place]  CM consult      [facility] Choice  HOME HEALTH   Choice offered to / List presented to:  C-1 Patient   DME arranged  [place] arranged  [place].   Status of service:  Completed, signed off Medicare Important Message given?  YES (If response is "NO", the following Medicare IM given date fields will be blank) Date Medicare IM given:  02/27/2015 Medicare IM given by:  [facility] Date Additional Medicare IM given:   Additional Medicare IM given by:    Discharge Disposition:  [place]  Per UR Regulation:    If discussed at Long Length of Stay Meetings, dates discussed:    Comments:  02/27/15 15:30 CM met with pt to offer choice of home health agency.  Pt's daugher, Kelli Churn [phone] or [phone] is primary contact.  Pt will be going home with Katharine Look at [address] [place], [place] [address]. Pura Spice has delivered rolling walker to room.  CM gave Katharine Look a Private Duty Agency list and Katharine Look verbalized understanding this list is an out of pocket expense. Katharine Look has chosen [facility] to render HHPT/OT/aide/SW-for community resources to lessen the burden of care.  Pt's PCP is not in EPIC: Conni Slipper, MD.  Referral given to [facility] rep, Cyril Mourning with new address and contact information.  No other CM needs were communicated.  Mariane Masters, BSN, Cm [phone].

## 2015-02-27 NOTE — Progress Notes (Signed)
3 Days Post-Op  Subjective: States throat much better. No n/v. +bm and flatus per pt.   Objective: Vital signs in last 24 hours: Temp:  [96 F (35.6 C)-98.5 F (36.9 C)] 97.9 F (36.6 C) (03/01 0419) Pulse Rate:  [69-95] 86 (03/01 0419) Resp:  [20-22] 20 (03/01 0419) BP: (133-151)/(75-91) 133/79 mmHg (03/01 0419) SpO2:  [96 %-97 %] 97 % (03/01 0419) Last BM Date: 02/26/15  Intake/Output from previous day: 02/29 0701 - 03/01 0700 In: 990.8 [P.O.:360; I.V.:630.8] Out: 1325 [Urine:1325] Intake/Output this shift: Total I/O In: -  Out: 400 [Urine:400]  Alert, nad, sitting up in bed Appropriate cta ant Normal rate Soft, nt, nd, incision c/d/i - mild ttp at incision No edema  Lab Results:   Recent Labs  02/26/15 0500 02/27/15 0525  WBC 9.8 7.7  HGB 10.6* 10.5*  HCT 31.4* 30.6*  PLT 245 262   BMET  Recent Labs  02/26/15 0500 02/27/15 0525  NA 131* 131*  K 3.2* 3.4*  CL 98 96  CO2 26 26  GLUCOSE 114* 108*  BUN 13 14  CREATININE 0.44* 0.57  CALCIUM 7.8* 7.9*   PT/INR No results for input(s): LABPROT, INR in the last 72 hours. ABG No results for input(s): PHART, HCO3 in the last 72 hours.  Invalid input(s): PCO2, PO2  Studies/Results: No results found.  Anti-infectives: Anti-infectives    Start     Dose/Rate Route Frequency Ordered Stop   02/24/15 0230  ceFAZolin (ANCEF) powder 2 g  Status:  Discontinued     2 g Other To Surgery 02/24/15 0218 02/24/15 0220   02/24/15 0230  ceFAZolin (ANCEF) IVPB 2 g/50 mL premix     2 g 100 mL/hr over 30 Minutes Intravenous  Once 02/24/15 0221 02/24/15 0315      Assessment/Plan: s/p Procedure(s) with comments: HERNIA REPAIR INGUINAL INCARCERATED (Left) - With MESH Chronic afib - rate ok. Cont current bp meds; eliquis per cards; will let outpt cardiology determine length of timing of eliquis given age Anemia - hgb stable. No signs of bleeding Hypokalemia - replace potassium; since on hctz, may need to go home on  low dose daily kcl Hypomagnesia - replace magnesium PT/OT consult for discharge needs Plan d/c later today once pt/ot have seen pt.  Discussed wound care with pt.  Will need f/u with pcp soon after dc for f/u and lab monitoring Will need outpt f/u with cards as well   Mary SellaEric M. Andrey CampanileWilson, MD, FACS General, Bariatric, & Minimally Invasive Surgery [facility] [place]   LOS: 3 days    Atilano InaWILSON,[name] 02/27/2015

## 2015-02-27 NOTE — Progress Notes (Addendum)
SUBJECTIVE:  Sore throat better. No shortness of breath. Wants to go home if possible.  She has no pain with swallowing.  OBJECTIVE:   Vitals:   Filed Vitals:   02/26/15 1100 02/26/15 1233 02/26/15 2151 02/27/15 0419  BP: 151/75 139/83 141/91 133/79  Pulse: 95 69 85 86  Temp:  96 F (35.6 C) 98.5 F (36.9 C) 97.9 F (36.6 C)  TempSrc:  Oral Oral Oral  Resp:  Height:      Weight:      SpO2:  96% 97% 97%   I&O's:    Intake/Output Summary (Last 24 hours) at 02/27/15 0746 Last data filed at 02/27/15 0720  Gross per 24 hour  Intake    360 ml  Output   1550 ml  Net  -1190 ml   TELEMETRY: Reviewed telemetry pt in AFib, rate controlled. :     PHYSICAL EXAM General: Well developed, well nourished, in no acute distress Head:   Normal cephalic and atramatic  Lungs:  No wheezing. Heart:  irregularly irregular S1 S2  No JVD.   Abdomen: abdomen soft and non-tender Msk:  Back normal,  Normal strength and tone for age. Extremities:   No edema.   Neuro: Alert  Psych:  Anxious affect, responds appropriately Skin: No rash   LABS: Basic Metabolic Panel:  Recent Labs  81/19/14 0500 02/27/15 0525  NA 131* 131*  K 3.2* 3.4*  CL 98 96  CO2 26 26  GLUCOSE 114* 108*  BUN 13 14  CREATININE 0.44* 0.57  CALCIUM 7.8* 7.9*  MG  --  1.3*   Liver Function Tests: No results for input(s): AST, ALT, ALKPHOS, BILITOT, PROT, ALBUMIN in the last 72 hours. No results for input(s): LIPASE, AMYLASE in the last 72 hours. CBC:  Recent Labs  02/26/15 0500 02/27/15 0525  WBC 9.8 7.7  HGB 10.6* 10.5*  HCT 31.4* 30.6*  MCV 94.9 95.0  PLT 245 262   Cardiac Enzymes: No results for input(s): CKTOTAL, CKMB, CKMBINDEX, TROPONINI in the last 72 hours. BNP: Invalid input(s): POCBNP D-Dimer: No results for input(s): DDIMER in the last 72 hours. Hemoglobin A1C: No results for input(s): HGBA1C in the last 72 hours. Fasting Lipid Panel: No results for input(s): CHOL, HDL,  LDLCALC, TRIG, CHOLHDL, LDLDIRECT in the last 72 hours. Thyroid Function Tests:  Recent Labs  02/25/15 0956  TSH 3.600   Anemia Panel: No results for input(s): VITAMINB12, FOLATE, FERRITIN, TIBC, IRON, RETICCTPCT in the last 72 hours. Coag Panel:   Lab Results  Component Value Date   INR 1.1 09/10/2007    RADIOLOGY: Ct Abdomen Pelvis W Contrast  02/24/2015   CLINICAL DATA:  81 year old female with abdominal pain and vomiting. Pain for 5 hours. Lump left lower abdominal/ upper pelvis. Rule out strangulated hernia.  EXAM: CT ABDOMEN AND PELVIS WITH CONTRAST  TECHNIQUE: Multidetector CT imaging of the abdomen and pelvis was performed using the standard protocol following bolus administration of intravenous contrast.  CONTRAST:  OMNIPAQUE IOHEXOL 300 MG/ML SOLN, 50mL OMNIPAQUE IOHEXOL 300 MG/ML SOLN  COMPARISON:  Radiographs 2 hours prior  FINDINGS: Emphysematous changes noted at the lung bases. Bilateral pleural thickening versus diminutive pleural effusions. Mild atelectasis without consolidation. The heart is enlarged.  Stomach is distended with ingested contrast. Enteric tube is in place, tip in the stomach. There are dilated fluid-filled proximal small bowel loops, transition point is seen within the left inguinal hernia. Small bowel loops distal to this are  decompressed. There is no pneumatosis or perforation. Small amount of mesenteric free fluid and free fluid in the pelvis, likely reactive. There is no free air.  There is eventration of the right hemidiaphragm containing normal appearing liver. Within the subcapsular left hepatic lobe is a 13 mm wedge shaped hypodensity. This is not measure simple fluid density. There is question of some fill in on delayed imaging. There is no adjacent 8 mm hypodensity in the left lobe that fills in on delayed imaging. Subcentimeter cysts are seen in the left hepatic dome. The gallbladder is decompressed, question small dependent gallstone. Mild  prominence of the central intrahepatic biliary tree, the common bile duct is normal in caliber.  The spleen and adrenal glands are normal. The pancreas is atrophic with borderline mild diffuse pancreatic ductal dilatation of 3 mm. There is no surrounding inflammatory change.  The kidneys demonstrate symmetric enhancement and excretion. There is a partially exophytic cystic lesion arising from the lower right kidney measuring at least 5.2 x 5.2 x 6.8 cm. There is peripheral wall calcification inferiorly. Proximally there is questionable internal septation versus adjacent cyst. There is an adjacent cortical cyst that measures 1.9 cm in the lower pole. Small subcentimeter cyst seen in the left kidney.  Dense atherosclerosis of normal caliber abdominal aorta. No retroperitoneal adenopathy.  Stool throughout the colon with diverticulosis in tortuosity distally. No evidence of diverticulitis. The appendix is normal.  Within the pelvis the bladder is distended. Uterus is atrophic, normal for age. There is prominent periuterine vascularity and dilatation of the left ovarian vein, unusual for age. No adnexal mass.  Multilevel degenerative change throughout the lumbar spine. Degenerative change in both hips. No focal or acute osseous abnormality.  IMPRESSION: 1. Left inguinal hernia resulting in proximal small bowel obstruction. Small amount of free fluid is likely reactive. No pneumatosis or perforation. 2. Multiple chronic findings. Include diverticulosis without diverticulitis. 3. Bilateral renal cysts including a 6.8 cm Bosniak 2 cyst from the lower right kidney. 4. Hypodense hepatic lesion in the left lobe measures 1.3 cm. Incompletely characterize, however likely represents a hemangioma given some delayed fill-in. 5. Probable gallstone.  Atherosclerosis.   Electronically Signed   By: Rubye Oaks M.D.   On: 02/24/2015 01:15   Dg Abd Acute W/chest  02/23/2015   CLINICAL DATA:  Abdominal pain this evening  throughout. History of hypertension. Nonsmoker.  EXAM: ACUTE ABDOMEN SERIES (ABDOMEN 2 VIEW & CHEST 1 VIEW)  COMPARISON:  Chest 09/10/2007  FINDINGS: Cardiac enlargement without vascular congestion. Calcified granulomas in the lungs with calcified right hilar lymph nodes. No focal airspace disease or consolidation in the lungs. Diffuse emphysematous changes with scattered fibrosis likely representing chronic bronchitis. Segmental elevation of the right hemi diaphragm. No change since previous chest study.  Gas distended small bowel in the mid abdomen with small air-fluid levels. Appearance suggests small bowel obstruction. Stool-filled colon without distention. No free intra-abdominal air. Multiple nodular opacities over the upper abdomen probably represents scan nodules. No radiopaque stones. Degenerative changes in the spine.  IMPRESSION: Emphysematous changes and fibrosis with granulomatous changes in the chest. No evidence of active pulmonary disease.  Gas-filled dilated small bowel in the mid abdomen with air-fluid levels suggesting small bowel obstruction.   Electronically Signed   By: Burman Nieves M.D.   On: 02/23/2015 23:52      ASSESSMENT: Roosvelt Harps:    1) AFib: rate controlled on PO metoprolol.  Changed to metoprolol 25 mg PO BID yesterday.  Low dose Eliquis  for stroke prevention.  Normal TSH. Echo pending.  Nl EF in 2010.  D/w Dr. Chilton SiGreen.  AFib is chronic since 2010.  He was ok with continuing low dose Eliquis.  No aspirin with patient taking Eliquis.  2) HTN: Variable control. Continue to follow.  May need to uptitrate metoprolol.  3) Watch for signs of fluid overload.  Appears euvolemic at this time.  Corky CraftsJayadeep S Makyiah Lie, MD  02/27/2015  7:46 AM

## 2015-02-27 NOTE — Evaluation (Signed)
Physical Therapy Evaluation Patient Details Name: Sarah Stone MRN: [id number] DOB: [birth year] Today's Date: 02/27/2015   History of Present Illness  Pt is a 81 year old female s/p left hernia repair inguinal incarcerated.   Clinical Impression  Pt admitted with above diagnosis. Pt currently with functional limitations due to the deficits listed below (see PT Problem List).  Pt will benefit from skilled PT to increase their independence and safety with mobility to allow discharge to the venue listed below.   Pt mobilizing well with RW and plans to d/c home with daughter since she lives alone.  REcommend RW and HHPT upon d/c.     Follow Up Recommendations Home health PT    Equipment Recommendations  Rolling walker with 5" wheels    Recommendations for Other Services       Precautions / Restrictions Precautions Precautions: Fall      Mobility  Bed Mobility Overal bed mobility: Needs Assistance Bed Mobility: Supine to Sit     Supine to sit: Supervision     General bed mobility comments: pt up in recliner on arrival  Transfers Overall transfer level: Needs assistance Equipment used: Rolling walker (2 wheeled) Transfers: Sit to/from Stand Sit to Stand: Min guard         General transfer comment: verbal cues for hand placement  Ambulation/Gait Ambulation/Gait assistance: Min guard Ambulation Distance (Feet): 400 Feet Assistive device: Rolling walker (2 wheeled) Gait Pattern/deviations: Step-through pattern     General Gait Details: pt able to tolerate good distance using RW, pt agreeable to use RW while healing, denies pain  Stairs            Wheelchair Mobility    Modified Rankin (Stroke Patients Only)       Balance                                             Pertinent Vitals/Pain Pain Assessment: No/denies pain    Home Living Family/patient expects to be discharged to:: Private residence Living Arrangements: Alone   Type  of Home: House       Home Layout: One level   Additional Comments: plans to d/c to daughters home with a couple steps to enter, her home has stairs    Prior Function Level of Independence: Independent         Comments: not driving     Hand Dominance        Extremity/Trunk Assessment   Upper Extremity Assessment: Overall WFL for tasks assessed           Lower Extremity Assessment: Generalized weakness         Communication   Communication: No difficulties  Cognition Arousal/Alertness: Awake/alert Behavior During Therapy: WFL for tasks assessed/performed Overall Cognitive Status: Within Functional Limits for tasks assessed                      General Comments      Exercises        Assessment/Plan    PT Assessment Patient needs continued PT services  PT Diagnosis Difficulty walking   PT Problem List Decreased strength;Decreased activity tolerance;Decreased mobility;Decreased knowledge of use of DME  PT Treatment Interventions DME instruction;Gait training;Stair training;Functional mobility training;Therapeutic activities;Therapeutic exercise;Patient/family education   PT Goals (Current goals can be found in the Care Plan section) Acute Rehab PT Goals Patient Stated Goal:  go home PT Goal Formulation: With patient Time For Goal Achievement: 03/03/15 Potential to Achieve Goals: Good    Frequency Min 3X/week   Barriers to discharge        Co-evaluation               End of Session Equipment Utilized During Treatment: Gait belt Activity Tolerance: Patient tolerated treatment well Patient left: in chair;with call bell/phone within reach;with chair alarm set           Time: [id number] PT Time Calculation (min) (ACUTE ONLY): 12 min   Charges:   PT Evaluation $Initial PT Evaluation Tier I: 1 Procedure     PT G Codes:        Jasim Harari,KATHrine E 02/27/2015, 12:49 PM Zenovia JarredKati Anedra Penafiel, PT, DPT 02/27/2015 Pager: [phone]

## 2015-02-27 NOTE — Discharge Summary (Signed)
[facility] Discharge Summary   Patient ID: Andris BaumannRose Adamec MRN: [id number] DOB/AGE: 79/01/1918 79 y.o.  Admit date: 02/23/2015 Discharge date: 02/27/2015  Admitting Diagnosis: Incarcerated femoral hernia  Discharge Diagnosis Patient Active Problem List   Diagnosis Date Noted  . Incarcerated femoral hernia 02/24/2015    Consultants Cardiology - Dr. Anne FuSkains, Dr. Eldridge DaceVaranasi, Dr. Tenny Crawoss  Imaging: No results found.  Procedures Dr. Johna SheriffHoxworth (02/24/15) - Repair of incarcerated left femoral hernia with mesh  Hospital Course:  81 year old female generally very healthy for her age and living independently who about 5 or 6 hours prior to presenting to the emergency department developed the onset of crampy abdominal pain and left groin pain and nausea. She presented to the emergency department and has been found to have an incarcerated left inguinal hernias below.  Patient was admitted and underwent procedure listed above.  Tolerated procedure well and was transferred to the floor.  Diet was advanced as tolerated.  Cardiology was consulted regarding chronic AFIB and was started on low dose Eliquis for stroke prevention after cardiology discussed with the patient's PCP.  Her aspirin was discontinued and she was switched from atenolol to metoprolol.  She experienced some electrolyte abnormalities and will be discharged home on a low dose potassium supplement until she can be further evaluated by cardiology and her PCP.  PT/OT were consulted since the decision was made to place the patient on anticoagulation.  They recommended HH PT/OT and a rolling walker which will be set up upon discharge.  On POD #3, the patient was voiding well, tolerating diet, ambulating well, pain well controlled, vital signs stable, incisions c/d/i with dermabond in place and felt stable for discharge home.  Patient will follow up in our office in 2-3 weeks and knows to call with questions or concerns.  She will need  appointment with PCP and cardiology ASAP upon discharge.        Medication List    STOP taking these medications        aspirin EC 81 MG tablet     atenolol 25 MG tablet  Commonly known as:  TENORMIN      TAKE these medications        apixaban 2.5 MG Tabs tablet  Commonly known as:  ELIQUIS  Take 1 tablet (2.5 mg total) by mouth 2 (two) times daily.     hydrochlorothiazide 25 MG tablet  Commonly known as:  HYDRODIURIL  Take 25 mg by mouth daily.     HYDROcodone-acetaminophen 5-325 MG per tablet  Commonly known as:  NORCO/VICODIN  Take 1-2 tablets by mouth every 6 (six) hours as needed for moderate pain.     latanoprost 0.005 % ophthalmic solution  Commonly known as:  XALATAN  Place 1 drop into both eyes every morning.     metoprolol tartrate 25 MG tablet  Commonly known as:  LOPRESSOR  Take 1 tablet (25 mg total) by mouth 2 (two) times daily.     potassium chloride 10 MEQ tablet  Commonly known as:  K-DUR  Take 1 tablet (10 mEq total) by mouth daily.         Follow-up Information    Follow up with Mariella SaaHOXWORTH,BENJAMIN T, MD.   Specialty:  General Surgery   Why:  For post-operation check   Contact information:   [address] [place] [address] [phone]       Follow up with GREEN, Lenon CurtARTHUR G, MD. Schedule an appointment as soon as possible for  a visit in 1 week.   Specialty:  Internal Medicine   Why:  FOLLOW UP WITH YOUR PRIMARY CARE PROVIDER ASAP, For post-hospital follow up   Contact information:   [address] [place] [address] [phone]       Follow up with Tereso Newcomer, PA-C. Schedule an appointment as soon as possible for a visit on 03/14/2015.   Specialty:  Physician Assistant   Why:  For post-hospital follow up with a cardiologist at 11:30am, please arrive by 11:00am to fill outpaperwork.     Contact information:   [address] [place] [address] [phone]       Signed: Aris [place]  [facility] [phone]  02/27/2015, 1:39 PM

## 2015-03-14 ENCOUNTER — Encounter: Payer: Medicare Other | Admitting: Physician Assistant

## 2015-03-26 ENCOUNTER — Encounter (HOSPITAL_COMMUNITY): Payer: Self-pay | Admitting: General Surgery

## 2016-01-19 ENCOUNTER — Emergency Department (HOSPITAL_COMMUNITY): Payer: Medicare Other

## 2016-01-19 ENCOUNTER — Inpatient Hospital Stay (HOSPITAL_COMMUNITY)
Admission: EM | Admit: 2016-01-19 | Discharge: 2016-01-23 | DRG: 065 | Disposition: A | Discharge status: Skilled Nursing Facility | Payer: Medicare Other | Attending: Internal Medicine | Admitting: Internal Medicine

## 2016-01-19 ENCOUNTER — Encounter (HOSPITAL_COMMUNITY): Payer: Self-pay | Admitting: Emergency Medicine

## 2016-01-19 DIAGNOSIS — I6340 Cerebral infarction due to embolism of unspecified cerebral artery: Secondary | ICD-10-CM | POA: Diagnosis present

## 2016-01-19 DIAGNOSIS — I482 Chronic atrial fibrillation, unspecified: Secondary | ICD-10-CM | POA: Diagnosis present

## 2016-01-19 DIAGNOSIS — E872 Acidosis, unspecified: Secondary | ICD-10-CM | POA: Insufficient documentation

## 2016-01-19 DIAGNOSIS — R402362 Coma scale, best motor response, obeys commands, at arrival to emergency department: Secondary | ICD-10-CM | POA: Diagnosis present

## 2016-01-19 DIAGNOSIS — I63412 Cerebral infarction due to embolism of left middle cerebral artery: Secondary | ICD-10-CM

## 2016-01-19 DIAGNOSIS — Z7982 Long term (current) use of aspirin: Secondary | ICD-10-CM

## 2016-01-19 DIAGNOSIS — R4701 Aphasia: Secondary | ICD-10-CM | POA: Diagnosis present

## 2016-01-19 DIAGNOSIS — E860 Dehydration: Secondary | ICD-10-CM | POA: Diagnosis present

## 2016-01-19 DIAGNOSIS — R402142 Coma scale, eyes open, spontaneous, at arrival to emergency department: Secondary | ICD-10-CM | POA: Diagnosis present

## 2016-01-19 DIAGNOSIS — Z8249 Family history of ischemic heart disease and other diseases of the circulatory system: Secondary | ICD-10-CM

## 2016-01-19 DIAGNOSIS — R29810 Facial weakness: Secondary | ICD-10-CM | POA: Diagnosis present

## 2016-01-19 DIAGNOSIS — R402252 Coma scale, best verbal response, oriented, at arrival to emergency department: Secondary | ICD-10-CM | POA: Diagnosis present

## 2016-01-19 DIAGNOSIS — I639 Cerebral infarction, unspecified: Secondary | ICD-10-CM | POA: Diagnosis not present

## 2016-01-19 DIAGNOSIS — T502X5A Adverse effect of carbonic-anhydrase inhibitors, benzothiadiazides and other diuretics, initial encounter: Secondary | ICD-10-CM | POA: Diagnosis present

## 2016-01-19 DIAGNOSIS — G3184 Mild cognitive impairment, so stated: Secondary | ICD-10-CM | POA: Diagnosis present

## 2016-01-19 DIAGNOSIS — R490 Dysphonia: Secondary | ICD-10-CM

## 2016-01-19 DIAGNOSIS — R29702 NIHSS score 2: Secondary | ICD-10-CM | POA: Diagnosis present

## 2016-01-19 DIAGNOSIS — F05 Delirium due to known physiological condition: Secondary | ICD-10-CM | POA: Diagnosis present

## 2016-01-19 DIAGNOSIS — R479 Unspecified speech disturbances: Secondary | ICD-10-CM

## 2016-01-19 DIAGNOSIS — E871 Hypo-osmolality and hyponatremia: Secondary | ICD-10-CM | POA: Diagnosis present

## 2016-01-19 DIAGNOSIS — Z8673 Personal history of transient ischemic attack (TIA), and cerebral infarction without residual deficits: Secondary | ICD-10-CM

## 2016-01-19 DIAGNOSIS — I10 Essential (primary) hypertension: Secondary | ICD-10-CM | POA: Diagnosis present

## 2016-01-19 DIAGNOSIS — Z6824 Body mass index (BMI) 24.0-24.9, adult: Secondary | ICD-10-CM | POA: Diagnosis not present

## 2016-01-19 DIAGNOSIS — E669 Obesity, unspecified: Secondary | ICD-10-CM | POA: Diagnosis present

## 2016-01-19 DIAGNOSIS — E785 Hyperlipidemia, unspecified: Secondary | ICD-10-CM | POA: Diagnosis present

## 2016-01-19 DIAGNOSIS — I6789 Other cerebrovascular disease: Secondary | ICD-10-CM | POA: Diagnosis not present

## 2016-01-19 LAB — URINALYSIS, ROUTINE W REFLEX MICROSCOPIC
Bilirubin Urine: NEGATIVE
GLUCOSE, UA: NEGATIVE mg/dL
Hgb urine dipstick: NEGATIVE
Ketones, ur: NEGATIVE mg/dL
Leukocytes, UA: NEGATIVE
Nitrite: NEGATIVE
PROTEIN: NEGATIVE mg/dL
Specific Gravity, Urine: 1.011 (ref 1.005–1.030)
pH: 7.5 (ref 5.0–8.0)

## 2016-01-19 LAB — CBC WITH DIFFERENTIAL/PLATELET
BASOS PCT: 0 %
Basophils Absolute: 0 10*3/uL (ref 0.0–0.1)
EOS ABS: 0.1 10*3/uL (ref 0.0–0.7)
Eosinophils Relative: 1 %
HCT: 34.2 % — ABNORMAL LOW (ref 36.0–46.0)
HEMOGLOBIN: 11.9 g/dL — AB (ref 12.0–15.0)
LYMPHS ABS: 2.1 10*3/uL (ref 0.7–4.0)
Lymphocytes Relative: 25 %
MCH: 34.3 pg — ABNORMAL HIGH (ref 26.0–34.0)
MCHC: 34.8 g/dL (ref 30.0–36.0)
MCV: 98.6 fL (ref 78.0–100.0)
Monocytes Absolute: 0.9 10*3/uL (ref 0.1–1.0)
Monocytes Relative: 11 %
NEUTROS PCT: 63 %
Neutro Abs: 5.2 10*3/uL (ref 1.7–7.7)
PLATELETS: 299 10*3/uL (ref 150–400)
RBC: 3.47 MIL/uL — ABNORMAL LOW (ref 3.87–5.11)
RDW: 16.2 % — ABNORMAL HIGH (ref 11.5–15.5)
WBC: 8.3 10*3/uL (ref 4.0–10.5)

## 2016-01-19 LAB — COMPREHENSIVE METABOLIC PANEL
ALK PHOS: 67 U/L (ref 38–126)
ALT: 18 U/L (ref 14–54)
AST: 38 U/L (ref 15–41)
Albumin: 3.8 g/dL (ref 3.5–5.0)
Anion gap: 9 (ref 5–15)
BUN: 14 mg/dL (ref 6–20)
CALCIUM: 8.7 mg/dL — AB (ref 8.9–10.3)
CO2: 27 mmol/L (ref 22–32)
CREATININE: 0.53 mg/dL (ref 0.44–1.00)
Chloride: 96 mmol/L — ABNORMAL LOW (ref 101–111)
GFR calc non Af Amer: >60 mL/min (ref >60)
Glucose, Bld: 117 mg/dL — ABNORMAL HIGH (ref 65–99)
Potassium: 4 mmol/L (ref 3.5–5.1)
SODIUM: 132 mmol/L — AB (ref 135–145)
Total Bilirubin: 1.2 mg/dL (ref 0.3–1.2)
Total Protein: 7.1 g/dL (ref 6.5–8.1)

## 2016-01-19 LAB — I-STAT CHEM 8, ED
BUN: 15 mg/dL (ref 6–20)
CHLORIDE: 94 mmol/L — AB (ref 101–111)
CREATININE: 0.6 mg/dL (ref 0.44–1.00)
Calcium, Ion: 1.07 mmol/L — ABNORMAL LOW (ref 1.13–1.30)
Glucose, Bld: 116 mg/dL — ABNORMAL HIGH (ref 65–99)
HEMATOCRIT: 38 % (ref 36.0–46.0)
Hemoglobin: 12.9 g/dL (ref 12.0–15.0)
POTASSIUM: 3.8 mmol/L (ref 3.5–5.1)
Sodium: 134 mmol/L — ABNORMAL LOW (ref 135–145)
TCO2: 28 mmol/L (ref 0–100)

## 2016-01-19 LAB — AMMONIA: Ammonia: 27 umol/L (ref 9–35)

## 2016-01-19 LAB — CBG MONITORING, ED: Glucose-Capillary: 107 mg/dL — ABNORMAL HIGH (ref 65–99)

## 2016-01-19 LAB — I-STAT CG4 LACTIC ACID, ED: LACTIC ACID, VENOUS: 2.41 mmol/L — AB (ref 0.5–2.0)

## 2016-01-19 LAB — PROTIME-INR
INR: 1.08 (ref 0.00–1.49)
Prothrombin Time: 14.2 seconds (ref 11.6–15.2)

## 2016-01-19 LAB — APTT: APTT: 28 s (ref 24–37)

## 2016-01-19 LAB — ETHANOL: Alcohol, Ethyl (B): <5 mg/dL (ref <5)

## 2016-01-19 MED ORDER — SODIUM CHLORIDE 0.9 % IV BOLUS (SEPSIS)
500.0000 mL | Freq: Once | INTRAVENOUS | Status: AC
  Administered 2016-01-19: 500 mL via INTRAVENOUS

## 2016-01-19 MED ORDER — STROKE: EARLY STAGES OF RECOVERY BOOK
Freq: Once | Status: AC
  Administered 2016-01-19: 17:00:00
  Filled 2016-01-19: qty 1

## 2016-01-19 MED ORDER — SODIUM CHLORIDE 0.9 % IV SOLN
1000.0000 mL | Freq: Once | INTRAVENOUS | Status: AC
  Administered 2016-01-19: 1000 mL via INTRAVENOUS

## 2016-01-19 MED ORDER — SODIUM CHLORIDE 0.9 % IV SOLN
INTRAVENOUS | Status: DC
  Administered 2016-01-19 – 2016-01-20 (×2): via INTRAVENOUS

## 2016-01-19 MED ORDER — TIMOLOL MALEATE 0.5 % OP SOLN
1.0000 [drp] | Freq: Every day | OPHTHALMIC | Status: DC
  Administered 2016-01-20 – 2016-01-23 (×4): 1 [drp] via OPHTHALMIC
  Filled 2016-01-19: qty 5

## 2016-01-19 MED ORDER — LATANOPROST 0.005 % OP SOLN
1.0000 [drp] | Freq: Every day | OPHTHALMIC | Status: DC
  Administered 2016-01-20 – 2016-01-23 (×4): 1 [drp] via OPHTHALMIC
  Filled 2016-01-19: qty 2.5

## 2016-01-19 MED ORDER — TIMOLOL HEMIHYDRATE 0.5 % OP SOLN: 1.0000 [drp] | Freq: Every day | OPHTHALMIC | Status: DC

## 2016-01-19 MED ORDER — GADOBENATE DIMEGLUMINE 529 MG/ML IV SOLN
10.0000 mL | Freq: Once | INTRAVENOUS | Status: AC | PRN
  Administered 2016-01-19: 10 mL via INTRAVENOUS

## 2016-01-19 NOTE — ED Notes (Signed)
Off floor for testing 

## 2016-01-19 NOTE — H&P (Signed)
[facility] History and Physical  Sarah Stone:[id number] DOB: Nov 03, 1918 DOA: 01/19/2016  Referring physician: ER physician: Dr. Melene Plan  Cardiology: Dr. Eldridge Dace   Chief Complaint: difficulty speaking   HPI:  82 year old female with past medical history of atrial fibrillation, on [facility] with aspirin (used to be on eliquis but she has not taken this for last few months or so), hypertension who presented to [facility] ED for evaluation of difficulty speaking. Apparently she was in her usual state of health last reproted to have normal mental status at 6 pm yesterday and then later on in a night apparently had more trouble speaking. She does have chronic right facial droop per family report. No chest pain, shortness of breath. No loss of consciousness or falls.   Pt is awake but she keeps repeating "i don't know, I don't know". She cant answer questions about what happened.   In ED, she was hemodynamically stable. Blood work showed mild anemia of 11.9 which subsequently improved to normal. Lactic acid was 2.41. MRA brain demonstrated moderate-size mid left frontal lobe infarct involving left frontal operculum region with extension to left caudate body. Tiny amount of hemorrhage associated with infarct of the left caudate body with minimal deformity of the adjacent left lateral ventricle. Pt has remote basal ganglia infarcts. Neurology consulted and recommended to transfer the patient to [facility] for stroke work up completion.   Assessment & Plan    Principal Problem:   CVA (cerebral infarction) Stroke work up initiated:  - Aspirin daily (PR if she does not pass swallow screen) - MRI brain / MRA brain as noted below -moderate-size mid left frontal lobe infarct involving left frontal operculum region with extension to left caudate body. Tiny amount of hemorrhage associated with infarct of the left caudate body with minimal deformity of the adjacent left lateral ventricle. Pt has remote basal ganglia  infarcts.  - 2D ECHO - pending  - Carotid doppler - pending  - HgbA1c, Lipid panel - pending. LDL goal < 100. Patient not on any lipid lowering agent - Diet: NPO until swallow evaluation completed  - Therapy: PT/OT - order placed  Other Stroke Risk Factors : Advanced age, history of CVA, atrial fibrillation ,hypertension  - Appreciate neurology following  Active Problems:   Lactic acidosis - Unclear etiology - Has no evidence of sepsis - Possibly from dehydration - Repeat lactic acid    Benign essential HTN - Continue metoprolol (PO after she passes swallow screen). Would not use atenolol in combination with metoprolol unless tachycardic but at this time she is slightly bradycardic     Chronic atrial fibrillation - CHADS vasc score 5 - On AC with aspirin only, has not taken eliquis in few months  - AC per neurology - Rate controlled with metoprolol and atenolol (may start PO if passes swallow screen). As noted above, use only metoprolol rather than both metoprolol and atenolol    DVT prophylaxis:  - SCD's bilaterally   Radiological Exams on Admission: Dg Chest 2 View 01/19/2016 Enlarged cardiac silhouette without evidence of pulmonary edema or focal airspace consolidation. Electronically Signed   By: Ted Mcalpine M.D.   On: 01/19/2016 10:44   Ct Head Wo Contrast 01/19/2016   1. Atrophy and chronic ischemic changes in the white matter. 2. Additional ill-defined low-density area within the left frontal lobe is of uncertain age, favored to be additional chronic ischemic change. Could confirm with brain MRI if clinically needed. 3. No intracranial mass or  hemorrhage. Electronically Signed   By: Bary Richard M.D.   On: 01/19/2016 10:51   Mr Laqueta Jean ZO Contrast 01/19/2016  Exam is motion degraded. Moderate-size mid left frontal lobe infarct involving left frontal operculum region with extension to left caudate body. Tiny amount of hemorrhage associated with infarct of the left caudate  body with minimal deformity of the adjacent left lateral ventricle. Remote basal ganglia infarcts. Moderate small vessel disease changes. No intracranial mass or abnormal enhancement. Moderate global atrophy without hydrocephalus. Ectatic vertebral arteries with narrowing of left vertebral artery suspected. Ectatic basilar artery. Internal carotid arteries are patent bilaterally. Electronically Signed   By: Lacy Duverney M.D.   On: 01/19/2016 12:53   Code Status: Full Family Communication: Plan of care discussed with the patient  Disposition Plan: Admit for further evaluation, transfer to [facility] from [facility], telemetry admission   Manson Passey, MD  [facility] Pager [phone]  Time spent in minutes: 75 minutes  Review of Systems:  Constitutional: Negative for fever, chills and malaise/fatigue. Negative for diaphoresis.  HENT: Negative for hearing loss, ear pain, nosebleeds, congestion, sore throat, neck pain, tinnitus and ear discharge.   Eyes: Negative for blurred vision, double vision, photophobia, pain, discharge and redness.  Respiratory: Negative for cough, hemoptysis, sputum production, shortness of breath, wheezing and stridor.   Cardiovascular: Negative for chest pain, palpitations, orthopnea, claudication and leg swelling.  Gastrointestinal: Negative for nausea, vomiting and abdominal pain. Negative for heartburn, constipation, blood in stool and melena.  Genitourinary: Negative for dysuria, urgency, frequency, hematuria and flank pain.  Musculoskeletal: Negative for myalgias, back pain, joint pain and falls.  Skin: Negative for itching and rash.  Neurological: per HPI Endo/Heme/Allergies: Negative for environmental allergies and polydipsia. Does not bruise/bleed easily.  Psychiatric/Behavioral: Negative for suicidal ideas. The patient is not nervous/anxious.      Past Medical History  Diagnosis Date  . Hypertension    Past Surgical History  Procedure Laterality Date  . Inguinal  hernia repair Left 02/24/2015    Procedure: HERNIA REPAIR INGUINAL INCARCERATED;  Surgeon: Glenna Fellows, MD;  Location: [facility] ORS;  Service: General;  Laterality: Left;  With MESH   Social History:  reports that she has never smoked. She has never used smokeless tobacco. She reports that she does not drink alcohol or use illicit drugs.  No Known Allergies  Family History: hypertension in family    Prior to Admission medications   Medication Sig Start Date End Date Taking? Authorizing Provider  aspirin EC 81 MG tablet Take 81 mg by mouth daily.   Yes Historical Provider, MD  atenolol (TENORMIN) 25 MG tablet Take 25 mg by mouth daily.   Yes Historical Provider, MD  hydrochlorothiazide (HYDRODIURIL) 25 MG tablet Take 25 mg by mouth daily. 12/09/14  Yes Historical Provider, MD  latanoprost (XALATAN) 0.005 % ophthalmic solution Place 1 drop into both eyes daily.  02/18/15  Yes Historical Provider, MD  timolol (BETIMOL) 0.5 % ophthalmic solution Place 1 drop into the right eye daily.   Yes Historical Provider, MD  apixaban (ELIQUIS) 2.5 MG TABS tablet Take 1 tablet (2.5 mg total) by mouth 2 (two) times daily. Patient not taking: Reported on 01/19/2016 02/27/15   Nonie Hoyer, PA-C  metoprolol tartrate (LOPRESSOR) 25 MG tablet Take 1 tablet (25 mg total) by mouth 2 (two) times daily. Patient not taking: Reported on 01/19/2016 02/27/15   Nonie Hoyer, PA-C  potassium chloride (K-DUR) 10 MEQ tablet Take 1 tablet (10 mEq total)  by mouth daily. Patient not taking: Reported on 01/19/2016 02/27/15   Nonie Hoyer, PA-C   Physical Exam: Filed Vitals:   01/19/16 1229  BP: 150/72  Pulse: 62  Resp: 14  SpO2: 96%    Physical Exam  Constitutional: Appears well developed. No distress.  HENT: Normocephalic. No tonsillar erythema or exudates Eyes: Conjunctivae are normal. No scleral icterus.  Neck: Normal ROM. Neck supple. No JVD. No tracheal deviation. No thyromegaly.  CVS: RRR, S1/S2 appreciated.   Pulmonary: Effort and breath sounds normal, no stridor, rhonchi, wheezes, rales.  Abdominal: Soft. BS +,  no distension, tenderness, rebound or guarding.  Musculoskeletal: Normal range of motion. No edema and no tenderness.  Lymphadenopathy: No lymphadenopathy noted, cervical, inguinal. Skin: Skin is warm and dry. No rash noted.  No erythema. No pallor.  Psychiatric: Normal mood and affect. Behavior normal.   General: Mental Status: No aphasia. Able to follow some commands but with some difficulty. Noted speech repetitive. 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   Cranial Nerves: II: Discs flat bilaterally; Visual fields grossly normal, pupils equal, round, reactive to light and accommodation III,IV, VI: extra-ocular motions intact bilaterally; Very mild ptosis on right.  V,VII: smile symmetric, facial light touch sensation normal bilaterally VIII: hearing normal bilaterally IX,X: uvula rises symmetrically XI: bilateral shoulder shrug XII: midline tongue extension without atrophy or fasciculations  Motor: Right :Upper extremity 5/5Left: Upper extremity 5/5 Lower extremity 5/5Lower extremity 5/5 Tone and bulk:normal tone throughout; no atrophy noted Sensory: Pinprick and light touch intact throughout, bilaterally Deep Tendon Reflexes:  Right: Upper Extremity Left: Upper extremity   biceps (C-5 to C-6) 2/4 biceps (C-5 to C-6) 2/4 tricep (C7) 2/4triceps (C7) 2/4 Brachioradialis (C6) 2/4Brachioradialis (C6) 2/4  Lower Extremity Lower Extremity  quadriceps (L-2 to L-4) 2/4 quadriceps (L-2 to L-4) 2/4 Achilles (S1) 2/4Achilles (S1) 2/4  Plantars: Right:  downgoingLeft: downgoing Cerebellar: normal finger-to-nose, normal heel-to-shin test    Labs on Admission:  Basic Metabolic Panel:  Recent Labs Lab 01/19/16 1011 01/19/16 1013  NA 132* 134*  K 4.0 3.8  CL 96* 94*  CO2 27  --   GLUCOSE 117* 116*  BUN 14 15  CREATININE 0.53 0.60  CALCIUM 8.7*  --    Liver Function Tests:  Recent Labs Lab 01/19/16 1011  AST 38  ALT 18  ALKPHOS 67  BILITOT 1.2  PROT 7.1  ALBUMIN 3.8   No results for input(s): LIPASE, AMYLASE in the last 168 hours.  Recent Labs Lab 01/19/16 1011  AMMONIA 27   CBC:  Recent Labs Lab 01/19/16 1011 01/19/16 1013  WBC 8.3  --   NEUTROABS 5.2  --   HGB 11.9* 12.9  HCT 34.2* 38.0  MCV 98.6  --   PLT 299  --    Cardiac Enzymes: No results for input(s): CKTOTAL, CKMB, CKMBINDEX, TROPONINI in the last 168 hours. BNP: Invalid input(s): POCBNP CBG:  Recent Labs Lab 01/19/16 0954  GLUCAP 107*    If 7PM-7AM, please contact night-coverage [email or web address] Password [email or web address] 01/19/2016, 1:32 PM

## 2016-01-19 NOTE — Progress Notes (Signed)
Patient arrived to 5M15. Patient is oriented to person, states she does not know location, year, reason for being at hospital. [facility] 2 for LOC questions, noted aphasia. Denies pain, skin intact, SCDs ordered, tele initiated, noted hx of afib, passed stroke swallow screen and placed on heart healthy diet, patient updated on plan of care, Q2 vitals and neuro assessment started. Will continue to monitor closely.

## 2016-01-19 NOTE — ED Notes (Signed)
[facility] called for transport. 

## 2016-01-19 NOTE — ED Provider Notes (Signed)
CSN: [id number]     Arrival date & time 01/19/16  4098 History   First MD Initiated Contact with Patient 01/19/16 [phone]     Chief Complaint  Patient presents with  . Altered Mental Status     (Consider location/radiation/quality/duration/timing/severity/associated sxs/prior Treatment) Patient is a 82 y.o. female presenting with altered mental status and Acute Neurological Problem. The history is provided by the patient.  Altered Mental Status Associated symptoms: no fever, no headaches, no nausea, no palpitations and no vomiting   Cerebrovascular Accident This is a new problem. The current episode started yesterday. The problem occurs constantly. The problem has been gradually worsening. Pertinent negatives include no chest pain, no headaches and no shortness of breath. Nothing aggravates the symptoms. Nothing relieves the symptoms. She has tried nothing for the symptoms. The treatment provided no relief.    82 yo F with a chief complaint of naming difficulties. The started suddenly last night. Family dysfunction her on the phone and she had difficulty naming certain things. This is worsened throughout the morning. Patient has a history of atrial fibrillation is on a baby aspirin. Was on Eliquis in the remote past but has not taken it over the past few months. Denies head injury denies loss consciousness. Denies headaches or other symptoms. Patient having no trouble getting around the house has been going up and down stairs and taking care of herself is normal. Patient lives alone.  History reviewed. No pertinent past medical history. Past Surgical History  Procedure Laterality Date  . Inguinal hernia repair Left 02/24/2015    Procedure: HERNIA REPAIR INGUINAL INCARCERATED;  Surgeon: Glenna Fellows, MD;  Location: [facility] ORS;  Service: General;  Laterality: Left;  With MESH   History reviewed. No pertinent family history. Social History  Substance Use Topics  . Smoking status: Never Smoker    . Smokeless tobacco: Never Used  . Alcohol Use: No   OB History    No data available     Review of Systems  Constitutional: Negative for fever and chills.  HENT: Negative for congestion and rhinorrhea.   Eyes: Negative for redness and visual disturbance.  Respiratory: Negative for shortness of breath and wheezing.   Cardiovascular: Negative for chest pain and palpitations.  Gastrointestinal: Negative for nausea and vomiting.  Genitourinary: Negative for dysuria and urgency.  Musculoskeletal: Negative for myalgias and arthralgias.  Skin: Negative for pallor and wound.  Neurological: Negative for dizziness and headaches.       Issues with naming       Allergies  Review of patient's allergies indicates no known allergies.  Home Medications   Prior to Admission medications   Medication Sig Start Date End Date Taking? Authorizing Provider  aspirin EC 81 MG tablet Take 81 mg by mouth daily.   Yes Historical Provider, MD  atenolol (TENORMIN) 25 MG tablet Take 25 mg by mouth daily.   Yes Historical Provider, MD  hydrochlorothiazide (HYDRODIURIL) 25 MG tablet Take 25 mg by mouth daily. 12/09/14  Yes Historical Provider, MD  latanoprost (XALATAN) 0.005 % ophthalmic solution Place 1 drop into both eyes daily.  02/18/15  Yes Historical Provider, MD  timolol (BETIMOL) 0.5 % ophthalmic solution Place 1 drop into the right eye daily.   Yes Historical Provider, MD  apixaban (ELIQUIS) 2.5 MG TABS tablet Take 1 tablet (2.5 mg total) by mouth 2 (two) times daily. Patient not taking: Reported on 01/19/2016 02/27/15   Nonie Hoyer, PA-C  metoprolol tartrate (LOPRESSOR) 25 MG tablet Take  1 tablet (25 mg total) by mouth 2 (two) times daily. Patient not taking: Reported on 01/19/2016 02/27/15   Nonie Hoyer, PA-C  potassium chloride (K-DUR) 10 MEQ tablet Take 1 tablet (10 mEq total) by mouth daily. Patient not taking: Reported on 01/19/2016 02/27/15   Nonie Hoyer, PA-C   BP 183/80 mmHg  Pulse 57   Resp 18  SpO2 95% Physical Exam  Constitutional: She is oriented to person, place, and time. She appears well-developed and well-nourished. No distress.  HENT:  Head: Normocephalic and atraumatic.  Eyes: EOM are normal. Pupils are equal, round, and reactive to light.  Neck: Normal range of motion. Neck supple.  Cardiovascular: Normal rate and regular rhythm.  Exam reveals no gallop and no friction rub.   No murmur heard. Pulmonary/Chest: Effort normal. She has no wheezes. She has no rales.  Abdominal: Soft. She exhibits no distension. There is no tenderness. There is no rebound and no guarding.  Musculoskeletal: She exhibits no edema or tenderness.  Neurological: She is alert and oriented to person, place, and time. She has normal strength. No cranial nerve deficit or sensory deficit. She displays a negative Romberg sign. Coordination and gait normal. GCS eye subscore is 4. GCS verbal subscore is 5. GCS motor subscore is 6. She displays no Babinski's sign on the right side. She displays no Babinski's sign on the left side.  Reflex Scores:      Tricep reflexes are 2+ on the right side and 2+ on the left side.      Bicep reflexes are 2+ on the right side and 2+ on the left side.      Brachioradialis reflexes are 2+ on the right side and 2+ on the left side.      Patellar reflexes are 2+ on the right side and 2+ on the left side.      Achilles reflexes are 2+ on the right side and 2+ on the left side. Skin: Skin is warm and dry. She is not diaphoretic.  Psychiatric: She has a normal mood and affect. Her behavior is normal.  Nursing note and vitals reviewed.   ED Course  Procedures (including critical care time) Labs Review Labs Reviewed  COMPREHENSIVE METABOLIC PANEL - Abnormal; Notable for the following:    Sodium 132 (*)    Chloride 96 (*)    Glucose, Bld 117 (*)    Calcium 8.7 (*)    All other components within normal limits  CBC WITH DIFFERENTIAL/PLATELET - Abnormal; Notable for  the following:    RBC 3.47 (*)    Hemoglobin 11.9 (*)    HCT 34.2 (*)    MCH 34.3 (*)    RDW 16.2 (*)    All other components within normal limits  I-STAT CHEM 8, ED - Abnormal; Notable for the following:    Sodium 134 (*)    Chloride 94 (*)    Glucose, Bld 116 (*)    Calcium, Ion 1.07 (*)    All other components within normal limits  I-STAT CG4 LACTIC ACID, ED - Abnormal; Notable for the following:    Lactic Acid, Venous 2.41 (*)    All other components within normal limits  CBG MONITORING, ED - Abnormal; Notable for the following:    Glucose-Capillary 107 (*)    All other components within normal limits  URINE CULTURE  AMMONIA  ETHANOL  PROTIME-INR  APTT  URINALYSIS, ROUTINE W REFLEX MICROSCOPIC (NOT AT [facility])    Imaging Review Dg  Chest 2 View  01/19/2016  CLINICAL DATA:  Altered mental status and confusion since last night. EXAM: CHEST  2 VIEW COMPARISON:  Abdominal series radiograph dated 02/23/2015 FINDINGS: The cardiac silhouette is enlarged. Mediastinal contours appear intact. There is no evidence of focal airspace consolidation, pleural effusion or pneumothorax. Bilateral high density nodular opacities within the mid lung fields likely represent granulomas. Osseous structures are without acute abnormality. Soft tissues are grossly normal. IMPRESSION: Enlarged cardiac silhouette without evidence of pulmonary edema or focal airspace consolidation. Electronically Signed   By: Ted Mcalpine M.D.   On: 01/19/2016 10:44   Ct Head Wo Contrast  01/19/2016  CLINICAL DATA:  Altered mental status EXAM: CT HEAD WITHOUT CONTRAST TECHNIQUE: Contiguous axial images were obtained from the base of the skull through the vertex without intravenous contrast. COMPARISON:  None. FINDINGS: There is mild generalized brain atrophy with commensurate dilatation of the ventricles and sulci. Chronic small vessel ischemic changes noted within the bilateral periventricular and subcortical white matter  regions. Ill-defined low-density focus within the left frontal lobe is of uncertain age, more likely additional chronic ischemic change. There is no parenchymal mass or hemorrhage identified. No extra-axial hemorrhage. No acute osseous abnormality. Paranasal sinuses are clear. Superficial soft tissues are unremarkable. IMPRESSION: 1. Atrophy and chronic ischemic changes in the white matter. 2. Additional ill-defined low-density area within the left frontal lobe is of uncertain age, favored to be additional chronic ischemic change. Could confirm with brain MRI if clinically needed. 3. No intracranial mass or hemorrhage. Electronically Signed   By: Bary Richard M.D.   On: 01/19/2016 10:51   Mr Laqueta Jean JX Contrast  01/19/2016  CLINICAL DATA:  82 year old hypertensive female with sudden change in mental status yesterday with difficulty expressing self. Subsequent encounter. EXAM: MRI HEAD WITHOUT AND WITH CONTRAST TECHNIQUE: Multiplanar, multiecho pulse sequences of the brain and surrounding structures were obtained without and with intravenous contrast. CONTRAST:  10mL MULTIHANCE GADOBENATE DIMEGLUMINE 529 MG/ML IV SOLN COMPARISON:  01/19/2016 CT.  No comparison MR. FINDINGS: Exam is motion degraded. Moderate-size mid left frontal lobe infarct involving left frontal operculum region with extension to left caudate body. Tiny amount of hemorrhage associated with infarct of the left caudate body with minimal deformity of the adjacent left lateral ventricle. Remote basal ganglia infarcts. Moderate small vessel disease changes. No intracranial mass or abnormal enhancement. Moderate global atrophy without hydrocephalus. Ectatic vertebral arteries with narrowing of left vertebral artery suspected. Ectatic basilar artery. Internal carotid arteries are patent bilaterally. Post lens replacement otherwise orbital structures unremarkable. Opacification post left ethmoid sinus air cells and partial opacification left mastoid  air cells. Mild transverse ligament hypertrophy and mild narrowing upper cervical spine. Cervical medullary junction, pituitary region pineal region unremarkable. IMPRESSION: Exam is motion degraded. Moderate-size mid left frontal lobe infarct involving left frontal operculum region with extension to left caudate body. Tiny amount of hemorrhage associated with infarct of the left caudate body with minimal deformity of the adjacent left lateral ventricle. Remote basal ganglia infarcts. Moderate small vessel disease changes. No intracranial mass or abnormal enhancement. Moderate global atrophy without hydrocephalus. Ectatic vertebral arteries with narrowing of left vertebral artery suspected. Ectatic basilar artery. Internal carotid arteries are patent bilaterally. Electronically Signed   By: Lacy Duverney M.D.   On: 01/19/2016 12:53   I have personally reviewed and evaluated these images and lab results as part of my medical decision-making.   EKG Interpretation None      MDM   Final diagnoses:  Cerebral infarction due to unspecified mechanism    82 yo F with difficulty with naming. Patient has no other findings on neuro exam. Concern for possible stroke. We'll discuss with neurology.  Neuro wanting MRI.   MRI with acute stroke, will admit.   The patients results and plan were reviewed and discussed.   Any x-rays performed were independently reviewed by myself.   Differential diagnosis were considered with the presenting HPI.  Medications  latanoprost (XALATAN) 0.005 % ophthalmic solution 1 drop (not administered)   stroke: mapping our early stages of recovery book (not administered)  0.9 %  sodium chloride infusion (not administered)  timolol (TIMOPTIC) 0.5 % ophthalmic solution 1 drop (not administered)  0.9 %  sodium chloride infusion (0 mLs Intravenous Stopped 01/19/16 1229)  sodium chloride 0.9 % bolus 500 mL (0 mLs Intravenous Stopped 01/19/16 1408)  gadobenate dimeglumine  (MULTIHANCE) injection 10 mL (10 mLs Intravenous Contrast Given 01/19/16 1231)    Filed Vitals:   01/19/16 1229 01/19/16 1430  BP: 150/72 183/80  Pulse: 62 57  Resp: 14 18  SpO2: 96% 95%    Final diagnoses:  Cerebral infarction due to unspecified mechanism    Admission/ observation were discussed with the admitting physician, patient and/or family and they are comfortable with the plan.    Melene Plan, DO 01/19/16 1515

## 2016-01-19 NOTE — ED Notes (Signed)
Results given to Dr Adela Lank

## 2016-01-19 NOTE — ED Notes (Signed)
Report given to [facility] to [facility] Neuro floor

## 2016-01-19 NOTE — Consult Note (Signed)
Referring Physician: Dr Tyrone Nine, [facility]    Chief Complaint: aphasia, stroke on MRI  HPI:                                                                                                                                         Sarah Stone is an 82 y.o. female with a past medical history significant for atrial fibrillation (was on apixaban in the past), transfer to [facility] for further evaluation and management of stroke. Patient is alert and awake but can not tell me why she is in the hospital. As per [facility] reviewed, patient is very functional and still capable of living alone, family talked to her on the phone last night and noted that she was having difficulty naming certain things. This worsened throughout the morning and patient was taken to [facility] for evaluation. Work up at [employer] included a brain MRI that I reviewed myself and demonstrated a moderate-size mid left frontal lobe infarct involving left frontaloperculum region with extension to left caudate body. Tiny amount of hemorrhage associated with infarct of the left caudate body. Presently, she denies HA, vertigo, double vision, difficulty swallowing, focal weakness, or vision impairment. Available serologies reviewed and significant only for Na 132.  Date last known well: uncertain Time last known well: uncertain tPA Given: no, late presentation   History reviewed. No pertinent past medical history.  Past Surgical History  Procedure Laterality Date  . Inguinal hernia repair Left 02/24/2015    Procedure: HERNIA REPAIR INGUINAL INCARCERATED;  Surgeon: Excell Seltzer, MD;  Location: [facility];  Service: General;  Laterality: Left;  With MESH    History reviewed. No pertinent family history. Social History:  reports that she has never smoked. She has never used smokeless tobacco. She reports that she does not drink alcohol or use illicit drugs. Family history:unobtainable due to aphasia Allergies: No Known Allergies  Medications:                                                                                                                            Scheduled: .  stroke: mapping our early stages of recovery book   Does not apply Once  . [START ON 01/20/2016] latanoprost  1 drop Both Eyes Daily  . timolol  1 drop Right Eye Daily    ROS: unobtainable due to patient aphasia  History obtained from chart review   Physical exam:  Constitutional: elderly pleasant female in no apparent distress. Blood pressure 162/81, pulse 55, temperature 97.6 F (36.4 C), temperature source Oral, resp. rate 16, SpO2 94 %. Eyes: no jaundice or exophthalmos.  Head: normocephalic. Neck: supple, no bruits, no JVD. Cardiac: no murmurs. Lungs: clear. Abdomen: soft, no tender, no mass. Extremities: no edema, clubbing, or cyanosis.  Skin: no rash. Diffuse fibromas noted  Neurologic Examination:                                                                                                      General: NAD Mental Status: Alert, oriented, significant dysnomia but comprehension and repetition seem appropriate. Cranial Nerves: II: Discs flat bilaterally; Visual fields grossly normal, pupils equal, round, reactive to light and accommodation III,IV, VI: ptosis not present, extra-ocular motions intact bilaterally V,VII: smile symmetric, facial light touch sensation normal bilaterally VIII: hearing normal bilaterally IX,X: uvula rises symmetrically XI: bilateral shoulder shrug XII: midline tongue extension without atrophy or fasciculations Motor: Right : Upper extremity   5/5    Left:     Upper extremity   5/5  Lower extremity   5/5     Lower extremity   5/5 Tone and bulk:normal tone throughout; no atrophy noted Sensory: Pinprick and light touch intact throughout, bilaterally Deep Tendon Reflexes:  Right: Upper Extremity    Left: Upper extremity   biceps (C-5 to C-6) 2/4   biceps (C-5 to C-6) 2/4 tricep (C7) 2/4    triceps (C7) 2/4 Brachioradialis (C6) 2/4  Brachioradialis (C6) 2/4  Lower Extremity Lower Extremity  quadriceps (L-2 to L-4) 2/4   quadriceps (L-2 to L-4) 2/4 Achilles (S1) 2/4   Achilles (S1) 2/4  Plantars: Right: downgoing   Left: downgoing Cerebellar: normal finger-to-nose,  normal heel-to-shin test Gait:  No tested due to multiple leads    Results for orders placed or performed during the hospital encounter of 01/19/16 (from the past 48 hour(s))  CBG monitoring, ED     Status: Abnormal   Collection Time: 01/19/16  9:54 AM  Result Value Ref Range   Glucose-Capillary 107 (H) 65 - 99 mg/dL  Ammonia     Status: None   Collection Time: 01/19/16 10:11 AM  Result Value Ref Range   Ammonia 27 9 - 35 umol/L  Comprehensive metabolic panel     Status: Abnormal   Collection Time: 01/19/16 10:11 AM  Result Value Ref Range   Sodium 132 (L) 135 - 145 mmol/L   Potassium 4.0 3.5 - 5.1 mmol/L   Chloride 96 (L) 101 - 111 mmol/L   CO2 27 22 - 32 mmol/L   Glucose, Bld 117 (H) 65 - 99 mg/dL   BUN 14 6 - 20 mg/dL   Creatinine, Ser 0.53 0.44 - 1.00 mg/dL   Calcium 8.7 (L) 8.9 - 10.3 mg/dL   Total Protein 7.1 6.5 - 8.1 g/dL   Albumin 3.8 3.5 - 5.0 g/dL   AST 38 15 - 41 U/L   ALT 18 14 - 54 U/L  Alkaline Phosphatase 67 38 - 126 U/L   Total Bilirubin 1.2 0.3 - 1.2 mg/dL   GFR calc non Af Amer >60 >60 mL/min   GFR calc Af Amer >60 >60 mL/min    Comment: (NOTE) The eGFR has been calculated using the CKD EPI equation. This calculation has not been validated in all clinical situations. eGFR's persistently <60 mL/min signify possible Chronic Kidney Disease.    Anion gap 9 5 - 15  CBC WITH DIFFERENTIAL     Status: Abnormal   Collection Time: 01/19/16 10:11 AM  Result Value Ref Range   WBC 8.3 4.0 - 10.5 K/uL   RBC 3.47 (L) 3.87 - 5.11 MIL/uL   Hemoglobin 11.9 (L) 12.0 - 15.0 g/dL   HCT 34.2  (L) 36.0 - 46.0 %   MCV 98.6 78.0 - 100.0 fL   MCH 34.3 (H) 26.0 - 34.0 pg   MCHC 34.8 30.0 - 36.0 g/dL   RDW 16.2 (H) 11.5 - 15.5 %   Platelets 299 150 - 400 K/uL   Neutrophils Relative % 63 %   Neutro Abs 5.2 1.7 - 7.7 K/uL   Lymphocytes Relative 25 %   Lymphs Abs 2.1 0.7 - 4.0 K/uL   Monocytes Relative 11 %   Monocytes Absolute 0.9 0.1 - 1.0 K/uL   Eosinophils Relative 1 %   Eosinophils Absolute 0.1 0.0 - 0.7 K/uL   Basophils Relative 0 %   Basophils Absolute 0.0 0.0 - 0.1 K/uL  Ethanol     Status: None   Collection Time: 01/19/16 10:11 AM  Result Value Ref Range   Alcohol, Ethyl (B) <5 <5 mg/dL    Comment:        LOWEST DETECTABLE LIMIT FOR SERUM ALCOHOL IS 5 mg/dL FOR MEDICAL PURPOSES ONLY   Protime-INR     Status: None   Collection Time: 01/19/16 10:11 AM  Result Value Ref Range   Prothrombin Time 14.2 11.6 - 15.2 seconds   INR 1.08 0.00 - 1.49  APTT     Status: None   Collection Time: 01/19/16 10:11 AM  Result Value Ref Range   aPTT 28 24 - 37 seconds  I-Stat Chem 8, ED     Status: Abnormal   Collection Time: 01/19/16 10:13 AM  Result Value Ref Range   Sodium 134 (L) 135 - 145 mmol/L   Potassium 3.8 3.5 - 5.1 mmol/L   Chloride 94 (L) 101 - 111 mmol/L   BUN 15 6 - 20 mg/dL   Creatinine, Ser 0.60 0.44 - 1.00 mg/dL   Glucose, Bld 116 (H) 65 - 99 mg/dL   Calcium, Ion 1.07 (L) 1.13 - 1.30 mmol/L   TCO2 28 0 - 100 mmol/L   Hemoglobin 12.9 12.0 - 15.0 g/dL   HCT 38.0 36.0 - 46.0 %  I-Stat CG4 Lactic Acid, ED     Status: Abnormal   Collection Time: 01/19/16 10:13 AM  Result Value Ref Range   Lactic Acid, Venous 2.41 (HH) 0.5 - 2.0 mmol/L   Comment NOTIFIED PHYSICIAN   Urinalysis, Routine w reflex microscopic (not at [facility])     Status: None   Collection Time: 01/19/16 11:19 AM  Result Value Ref Range   Color, Urine YELLOW YELLOW   APPearance CLEAR CLEAR   Specific Gravity, Urine 1.011 1.005 - 1.030   pH 7.5 5.0 - 8.0   Glucose, UA NEGATIVE NEGATIVE mg/dL   Hgb  urine dipstick NEGATIVE NEGATIVE   Bilirubin Urine NEGATIVE NEGATIVE   Ketones,  ur NEGATIVE NEGATIVE mg/dL   Protein, ur NEGATIVE NEGATIVE mg/dL   Nitrite NEGATIVE NEGATIVE   Leukocytes, UA NEGATIVE NEGATIVE    Comment: MICROSCOPIC NOT DONE ON URINES WITH NEGATIVE PROTEIN, BLOOD, LEUKOCYTES, NITRITE, OR GLUCOSE <1000 mg/dL.   Dg Chest 2 View  01/19/2016  CLINICAL DATA:  Altered mental status and confusion since last night. EXAM: CHEST  2 VIEW COMPARISON:  Abdominal series radiograph dated 02/23/2015 FINDINGS: The cardiac silhouette is enlarged. Mediastinal contours appear intact. There is no evidence of focal airspace consolidation, pleural effusion or pneumothorax. Bilateral high density nodular opacities within the mid lung fields likely represent granulomas. Osseous structures are without acute abnormality. Soft tissues are grossly normal. IMPRESSION: Enlarged cardiac silhouette without evidence of pulmonary edema or focal airspace consolidation. Electronically Signed   By: Fidela Salisbury M.D.   On: 01/19/2016 10:44   Ct Head Wo Contrast  01/19/2016  CLINICAL DATA:  Altered mental status EXAM: CT HEAD WITHOUT CONTRAST TECHNIQUE: Contiguous axial images were obtained from the base of the skull through the vertex without intravenous contrast. COMPARISON:  None. FINDINGS: There is mild generalized brain atrophy with commensurate dilatation of the ventricles and sulci. Chronic small vessel ischemic changes noted within the bilateral periventricular and subcortical white matter regions. Ill-defined low-density focus within the left frontal lobe is of uncertain age, more likely additional chronic ischemic change. There is no parenchymal mass or hemorrhage identified. No extra-axial hemorrhage. No acute osseous abnormality. Paranasal sinuses are clear. Superficial soft tissues are unremarkable. IMPRESSION: 1. Atrophy and chronic ischemic changes in the white matter. 2. Additional ill-defined low-density  area within the left frontal lobe is of uncertain age, favored to be additional chronic ischemic change. Could confirm with brain MRI if clinically needed. 3. No intracranial mass or hemorrhage. Electronically Signed   By: Franki Cabot M.D.   On: 01/19/2016 10:51   Mr Jeri Cos XQ Contrast  01/19/2016  CLINICAL DATA:  82 year old hypertensive female with sudden change in mental status yesterday with difficulty expressing self. Subsequent encounter. EXAM: MRI HEAD WITHOUT AND WITH CONTRAST TECHNIQUE: Multiplanar, multiecho pulse sequences of the brain and surrounding structures were obtained without and with intravenous contrast. CONTRAST:  54m MULTIHANCE GADOBENATE DIMEGLUMINE 529 MG/ML IV SOLN COMPARISON:  01/19/2016 CT.  No comparison MR. FINDINGS: Exam is motion degraded. Moderate-size mid left frontal lobe infarct involving left frontal operculum region with extension to left caudate body. Tiny amount of hemorrhage associated with infarct of the left caudate body with minimal deformity of the adjacent left lateral ventricle. Remote basal ganglia infarcts. Moderate small vessel disease changes. No intracranial mass or abnormal enhancement. Moderate global atrophy without hydrocephalus. Ectatic vertebral arteries with narrowing of left vertebral artery suspected. Ectatic basilar artery. Internal carotid arteries are patent bilaterally. Post lens replacement otherwise orbital structures unremarkable. Opacification post left ethmoid sinus air cells and partial opacification left mastoid air cells. Mild transverse ligament hypertrophy and mild narrowing upper cervical spine. Cervical medullary junction, pituitary region pineal region unremarkable. IMPRESSION: Exam is motion degraded. Moderate-size mid left frontal lobe infarct involving left frontal operculum region with extension to left caudate body. Tiny amount of hemorrhage associated with infarct of the left caudate body with minimal deformity of the adjacent  left lateral ventricle. Remote basal ganglia infarcts. Moderate small vessel disease changes. No intracranial mass or abnormal enhancement. Moderate global atrophy without hydrocephalus. Ectatic vertebral arteries with narrowing of left vertebral artery suspected. Ectatic basilar artery. Internal carotid arteries are patent bilaterally. Electronically Signed  By: Genia Del M.D.   On: 01/19/2016 12:53    Assessment: 82 y.o. female with atrial fibrillation off anticoagulants (was on apixaban before), with acute dysnomia and MRI brain disclosing an acute moderate size left frontal lobe infarct involving left frontaloperculum region with extension to left caudate body. Tiny amount of hemorrhage associated with infarct of the left caudate body. Suspect cardio-embolism to the left MCA territory in the setting of atrial fibrillation. Did not receive iv tPA due to late presentation. Reportedly was on apixaban in the past, but anticoagulation must be deferred for now. Complete stroke work up. Stroke team will follow up tomorrow.  Stroke Risk Factors - age, atrial fibrillation  Plan: 1. HgbA1c, fasting lipid panel 2. MRI, MRA  of the brain without contrast 3. Echocardiogram 4. Carotid dopplers 5. Prophylactic therapy-NOAC deferred for now 6. Risk factor modification 7. Telemetry monitoring 8. Frequent neuro checks 9. PT/OT SLP   Dorian Pod, MD [facility] [phone]  01/19/2016, 4:05 PM

## 2016-01-19 NOTE — ED Notes (Addendum)
Family reports pt has had confusion since last night at 10pm. Family called PCP who said to watch her overnight and re-eval in am. Family reports symptoms are worse this am. Pt replacing words. Has facial droop, but family reports this has been going on for a while. Pt hard of hearing. Disoriented to time and place.

## 2016-01-19 NOTE — ED Notes (Signed)
Per family member-states his mother in law has had a sudden change in mental status-last known normal was yesterday around 6 pm-states confused and having difficulty expressing herself-no history of dementia-states she is dependent and only history is A-Fib-no complaints of pain-states right facial/mouth droop is from a past fall-family states no recent injuries, falls, etc

## 2016-01-20 ENCOUNTER — Inpatient Hospital Stay (HOSPITAL_COMMUNITY): Payer: Medicare Other

## 2016-01-20 DIAGNOSIS — I639 Cerebral infarction, unspecified: Secondary | ICD-10-CM | POA: Insufficient documentation

## 2016-01-20 DIAGNOSIS — R490 Dysphonia: Secondary | ICD-10-CM

## 2016-01-20 LAB — URINE CULTURE

## 2016-01-20 LAB — LIPID PANEL
CHOL/HDL RATIO: 3.6 ratio
Cholesterol: 138 mg/dL (ref 0–200)
HDL: 38 mg/dL — AB (ref >40)
LDL CALC: 91 mg/dL (ref 0–99)
TRIGLYCERIDES: 44 mg/dL (ref <150)
VLDL: 9 mg/dL (ref 0–40)

## 2016-01-20 MED ORDER — LORAZEPAM 2 MG/ML IJ SOLN
1.0000 mg | Freq: Once | INTRAMUSCULAR | Status: AC
  Administered 2016-01-20: 1 mg via INTRAVENOUS

## 2016-01-20 MED ORDER — LORAZEPAM 2 MG/ML IJ SOLN
INTRAMUSCULAR | Status: AC
  Filled 2016-01-20: qty 1

## 2016-01-20 MED ORDER — ATORVASTATIN CALCIUM 10 MG PO TABS
20.0000 mg | ORAL_TABLET | Freq: Every day | ORAL | Status: DC
  Administered 2016-01-21 – 2016-01-22 (×2): 20 mg via ORAL
  Filled 2016-01-20 (×3): qty 2

## 2016-01-20 MED ORDER — ASPIRIN EC 325 MG PO TBEC
325.0000 mg | DELAYED_RELEASE_TABLET | Freq: Every day | ORAL | Status: DC
  Administered 2016-01-20 – 2016-01-21 (×2): 325 mg via ORAL
  Filled 2016-01-20 (×3): qty 1

## 2016-01-20 MED ORDER — HALOPERIDOL LACTATE 5 MG/ML IJ SOLN
1.0000 mg | Freq: Once | INTRAMUSCULAR | Status: AC
  Administered 2016-01-20: 1 mg via INTRAVENOUS
  Filled 2016-01-20: qty 1

## 2016-01-20 NOTE — Progress Notes (Signed)
[facility]                                                                              Patient Demographics  Sarah Stone, is a 82 y.o. female, DOB - 06/05/1918, ZOX:[id number]  Admit date - 01/19/2016   Admitting Physician Alison Murray, MD  Outpatient Primary MD for the patient is No primary care provider on file.  LOS - 1   Chief Complaint  Patient presents with  . Altered Mental Status       Brief HPI   82 year old female with past medical history of atrial fibrillation, on AC with aspirin (used to be on eliquis but she has not taken this for last few months or so), hypertension who presented to [facility] ED for evaluation of difficulty speaking. Apparently she was in her usual state of health last reproted to have normal mental status at 6 pm the day before the admission and then later on in a night apparently had more trouble speaking. She does have chronic right facial droop per family report. No chest pain, shortness of breath. No loss of consciousness or falls.    Assessment & Plan    Principal Problem: Acute CVA- presenting with difficulty speaking, aphasia, stroke risk factors age, atrial fibrillation - Delayed presentation hence not a TPA candidate. Neurology was consulted. - MRI brain showed moderate sized mid left frontal lobe infarct involving the left frontal upper [place] region with extension to the left caudate body, tiny amount of hemorrhage associated with infarct of the left orbit body with minimal deformity of the chest and left lateral ventricle. - MRA pending - 2-D echo pending - Carotid Dopplers showed 1-39% stenosis involving the right ICA and left ICA - Neurology recommended aspirin, NOAC deferred for now - Lipid panel showed LDL 91 , goal less than 70, placed on statin - Hemoglobin A1c pending - PT evaluation pending  Lactic acidosis - Unclear etiology, no evidence of sepsis likely from dehydration. - Continue IV fluid hydration    Benign essential HTN - BP currently soft, hold any antihypertensives, continue gentle hydration  Chronic atrial fibrillation - CHADS vasc score 5 - On AC with aspirin only, has not taken eliquis in few months  - Anticoagulation per neurology - Rate controlled with metoprolol and atenolol (may start PO if passes swallow screen). As noted above, use only metoprolol rather than both metoprolol and atenolol.   Code Status: Full CODE STATUS  Family Communication: Discussed in detail with the patient, all imaging results, lab results explained to the patient . No family member at the bedside   Disposition Plan:  Time Spent in minutes   Procedures  MRI brain Carotid Dopplers  Consults   Neurology  DVT Prophylaxis SCD's  Medications  Scheduled Meds: . latanoprost  1 drop Both Eyes Daily  . timolol  1 drop Right Eye Daily   Continuous Infusions: . sodium chloride 50 mL/hr at 01/20/16 1152   PRN Meds:.   Antibiotics   Anti-infectives    None        Subjective:   [name] was seen and examined today.  Still has aphasia , Patient denies dizziness, chest pain, shortness of breath, abdominal pain, N/V/D/C.Marland Kitchen No acute events overnight.    Objective:   Blood pressure 134/65, pulse 71, temperature 97.8 F (36.6 C), temperature source Oral, resp. rate 18, SpO2 92 %.  Wt Readings from Last 3 Encounters:  02/24/15 60.147 kg (132 lb 9.6 oz)    No intake or output data in the 24 hours ending 01/20/16 1223  Exam  General: Alert, oriented however significant aphasia with difficulty with comprehension  HEENT:  PERRLA, EOMI, Anicteric Sclera, mucous membranes moist.   Neck: Supple, no JVD, no masses  CVS: S1 S2 auscultated, no rubs, murmurs or gallops. Regular rate and rhythm.  Respiratory: Clear to auscultation bilaterally, no wheezing, rales or rhonchi  Abdomen: Soft, nontender, nondistended, + bowel sounds  Ext: no cyanosis clubbing or  edema  Neuro: AAOx2, Cr N's II- XII. Strength 5/5 upper and lower extremities bilaterally  Skin: No rashes  Psych: Normal affect and demeanor, alert and oriented x2   Data Review   Micro Results No results found for this or any previous visit (from the past 240 hour(s)).  Radiology Reports Dg Chest 2 View  01/19/2016  CLINICAL DATA:  Altered mental status and confusion since last night. EXAM: CHEST  2 VIEW COMPARISON:  Abdominal series radiograph dated 02/23/2015 FINDINGS: The cardiac silhouette is enlarged. Mediastinal contours appear intact. There is no evidence of focal airspace consolidation, pleural effusion or pneumothorax. Bilateral high density nodular opacities within the mid lung fields likely represent granulomas. Osseous structures are without acute abnormality. Soft tissues are grossly normal. IMPRESSION: Enlarged cardiac silhouette without evidence of pulmonary edema or focal airspace consolidation. Electronically Signed   By: Ted Mcalpine M.D.   On: 01/19/2016 10:44   Ct Head Wo Contrast  01/19/2016  CLINICAL DATA:  Altered mental status EXAM: CT HEAD WITHOUT CONTRAST TECHNIQUE: Contiguous axial images were obtained from the base of the skull through the vertex without intravenous contrast. COMPARISON:  None. FINDINGS: There is mild generalized brain atrophy with commensurate dilatation of the ventricles and sulci. Chronic small vessel ischemic changes noted within the bilateral periventricular and subcortical white matter regions. Ill-defined low-density focus within the left frontal lobe is of uncertain age, more likely additional chronic ischemic change. There is no parenchymal mass or hemorrhage identified. No extra-axial hemorrhage. No acute osseous abnormality. Paranasal sinuses are clear. Superficial soft tissues are unremarkable. IMPRESSION: 1. Atrophy and chronic ischemic changes in the white matter. 2. Additional ill-defined low-density area within the left frontal  lobe is of uncertain age, favored to be additional chronic ischemic change. Could confirm with brain MRI if clinically needed. 3. No intracranial mass or hemorrhage. Electronically Signed   By: Bary Richard M.D.   On: 01/19/2016 10:51   Mr Laqueta Jean ZO Contrast  01/19/2016  CLINICAL DATA:  82 year old hypertensive female with sudden change in mental status yesterday with difficulty expressing self. Subsequent encounter. EXAM: MRI HEAD WITHOUT AND WITH CONTRAST TECHNIQUE: Multiplanar, multiecho pulse sequences of the brain and surrounding structures were obtained without and with intravenous contrast. CONTRAST:  10mL MULTIHANCE GADOBENATE DIMEGLUMINE 529 MG/ML IV SOLN COMPARISON:  01/19/2016 CT.  No comparison MR. FINDINGS: Exam is motion degraded. Moderate-size mid left frontal lobe infarct involving left frontal operculum region with extension to left caudate body. Tiny amount of hemorrhage associated with infarct of the left caudate body with minimal deformity of the adjacent left lateral ventricle. Remote basal ganglia infarcts. Moderate small vessel disease changes.  No intracranial mass or abnormal enhancement. Moderate global atrophy without hydrocephalus. Ectatic vertebral arteries with narrowing of left vertebral artery suspected. Ectatic basilar artery. Internal carotid arteries are patent bilaterally. Post lens replacement otherwise orbital structures unremarkable. Opacification post left ethmoid sinus air cells and partial opacification left mastoid air cells. Mild transverse ligament hypertrophy and mild narrowing upper cervical spine. Cervical medullary junction, pituitary region pineal region unremarkable. IMPRESSION: Exam is motion degraded. Moderate-size mid left frontal lobe infarct involving left frontal operculum region with extension to left caudate body. Tiny amount of hemorrhage associated with infarct of the left caudate body with minimal deformity of the adjacent left lateral ventricle.  Remote basal ganglia infarcts. Moderate small vessel disease changes. No intracranial mass or abnormal enhancement. Moderate global atrophy without hydrocephalus. Ectatic vertebral arteries with narrowing of left vertebral artery suspected. Ectatic basilar artery. Internal carotid arteries are patent bilaterally. Electronically Signed   By: Lacy Duverney M.D.   On: 01/19/2016 12:53    CBC  Recent Labs Lab 01/19/16 1011 01/19/16 1013  WBC 8.3  --   HGB 11.9* 12.9  HCT 34.2* 38.0  PLT 299  --   MCV 98.6  --   MCH 34.3*  --   MCHC 34.8  --   RDW 16.2*  --   LYMPHSABS 2.1  --   MONOABS 0.9  --   EOSABS 0.1  --   BASOSABS 0.0  --     Chemistries   Recent Labs Lab 01/19/16 1011 01/19/16 1013  NA 132* 134*  K 4.0 3.8  CL 96* 94*  CO2 27  --   GLUCOSE 117* 116*  BUN 14 15  CREATININE 0.53 0.60  CALCIUM 8.7*  --   AST 38  --   ALT 18  --   ALKPHOS 67  --   BILITOT 1.2  --    ------------------------------------------------------------------------------------------------------------------ CrCl cannot be calculated (Unknown ideal weight.). ------------------------------------------------------------------------------------------------------------------ No results for input(s): HGBA1C in the last 72 hours. ------------------------------------------------------------------------------------------------------------------  Recent Labs  01/20/16 0710  CHOL 138  HDL 38*  LDLCALC 91  TRIG 44  CHOLHDL 3.6   ------------------------------------------------------------------------------------------------------------------ No results for input(s): TSH, T4TOTAL, T3FREE, THYROIDAB in the last 72 hours.  Invalid input(s): FREET3 ------------------------------------------------------------------------------------------------------------------ No results for input(s): VITAMINB12, FOLATE, FERRITIN, TIBC, IRON, RETICCTPCT in the last 72 hours.  Coagulation profile  Recent  Labs Lab 01/19/16 1011  INR 1.08    No results for input(s): DDIMER in the last 72 hours.  Cardiac Enzymes No results for input(s): CKMB, TROPONINI, MYOGLOBIN in the last 168 hours.  Invalid input(s): CK ------------------------------------------------------------------------------------------------------------------ Invalid input(s): POCBNP   Recent Labs  01/19/16 0954  GLUCAP 107*     Yuka Lallier M.D. [facility] 01/20/2016, 12:23 PM  Pager: [phone] Between 7am to 7pm - call Pager - [phone]  After 7pm go to [email or web address] - password [email or web address]  Call night coverage person covering after 7pm

## 2016-01-20 NOTE — Progress Notes (Signed)
STROKE TEAM PROGRESS NOTE   HISTORY OF PRESENT ILLNESS Sarah Stone is an 82 y.o. female with a past medical history significant for atrial fibrillation (was on apixaban in the past), transfer to [facility] for further evaluation and management of stroke. Patient is alert and awake but can not tell me why she is in the hospital. As per chart reviewed, patient is very functional and still capable of living alone, family talked to her on the phone last night and noted that she was having difficulty naming certain things. This worsened throughout the morning and patient was taken to [facility] for evaluation. Work up at [facility] included a brain MRI that I reviewed myself and demonstrated a moderate-size mid left frontal lobe infarct involving left frontaloperculum region with extension to left caudate body. Tiny amount of hemorrhage associated with infarct of the left caudate body. Presently, she denies HA, vertigo, double vision, difficulty swallowing, focal weakness, or vision impairment. Available serologies reviewed and significant only for Na 132.  Date last known well: uncertain Time last known well: uncertain tPA Given: no, late presentation   SUBJECTIVE (INTERVAL HISTORY) Daughter is at bedside. Patient is becoming agitated, wandering around the room, not redirectable, likely sundowning. suggested a sitter overnight. She was place on Eliquis inpatient in 02/2015 by cardiology for chronic afib and stroke prevention but per daughter her pcp discontinued almost immediately. Discussed restarting, she will discuss with Dr. Pearlean Brownie in the morning.    OBJECTIVE Temp:  [97.6 F (36.4 C)-98.9 F (37.2 C)] 98.7 F (37.1 C) (01/22 1514) Pulse Rate:  [55-102] 87 (01/22 1514) Cardiac Rhythm:  [-] Atrial fibrillation (01/22 0826) Resp:  [15-20] 18 (01/22 1514) BP: (109-178)/(59-99) 165/89 mmHg (01/22 1514) SpO2:  [92 %-96 %] 94 % (01/22 1514)  CBC:   Recent Labs Lab 01/19/16 1011 01/19/16 1013  WBC 8.3   --   NEUTROABS 5.2  --   HGB 11.9* 12.9  HCT 34.2* 38.0  MCV 98.6  --   PLT 299  --     Basic Metabolic Panel:   Recent Labs Lab 01/19/16 1011 01/19/16 1013  NA 132* 134*  K 4.0 3.8  CL 96* 94*  CO2 27  --   GLUCOSE 117* 116*  BUN 14 15  CREATININE 0.53 0.60  CALCIUM 8.7*  --     Lipid Panel:     Component Value Date/Time   CHOL 138 01/20/2016 0710   TRIG 44 01/20/2016 0710   HDL 38* 01/20/2016 0710   CHOLHDL 3.6 01/20/2016 0710   VLDL 9 01/20/2016 0710   LDLCALC 91 01/20/2016 0710   HgbA1c: No results found for: HGBA1C Urine Drug Screen: No results found for: LABOPIA, COCAINSCRNUR, LABBENZ, AMPHETMU, THCU, LABBARB    IMAGING  Dg Chest 2 View 01/19/2016   Enlarged cardiac silhouette without evidence of pulmonary edema or focal airspace consolidation.     Ct Head Wo Contrast 01/19/2016   1. Atrophy and chronic ischemic changes in the white matter.  2. Additional ill-defined low-density area within the left frontal lobe is of uncertain age, favored to be additional chronic ischemic change. Could confirm with brain MRI if clinically needed.  3. No intracranial mass or hemorrhage.     Mr Lodema Pilot Contrast 01/19/2016   Exam is motion degraded. Moderate-size mid left frontal lobe infarct involving left frontal operculum region with extension to left caudate body. Tiny amount of hemorrhage associated with infarct of the left caudate body with minimal deformity of the adjacent left lateral  ventricle. Remote basal ganglia infarcts. Moderate small vessel disease changes. No intracranial mass or abnormal enhancement. Moderate global atrophy without hydrocephalus. Ectatic vertebral arteries with narrowing of left vertebral artery suspected. Ectatic basilar artery. Internal carotid arteries are patent bilaterally.     PHYSICAL EXAM  PHYSICAL EXAM Physical exam: Exam: Gen: NAD Eyes: anicteric sclerae, moist conjunctivae                    CV: no MRG, no carotid  bruits, no peripheral edema Mental Status: Alert, follows commands, good historian  Neuro: Detailed Neurologic Exam  Speech:    Patient with expressive aphasia. Difficulty naming objects. Perseveration. Can repeat. Follows simple commands. Can read "close your eyes" but doesn't perform the task. She denies any problems with speaking.   Cranial Nerves:    The pupils are equal, round, and reactive to light.. Attempted, Fundi not visualized.  EOMI. No gaze preference. Blinks to threat bilaterally.  Face symmetric, Tongue midline. Hearing intact to voice. Shoulder shrug intact  Motor Observation:    no involuntary movements noted. Tone appears normal.     Strength:    Difficult to fully examine due to cognitive deficits and sundowning, strength appears equal and intact in all extremities.      Sensation: appears intact, withdraws to pain x4  Plantars downgoing.   Coordination: No dysmetria  Gait: slight wide based, ambulating around the room independently  ASSESSMENT/PLAN Ms. Sarah Stone is a 82 y.o. female with history of hypertension, chronic atrial fibrillation, and previous strokes by MRI presenting with speech difficulties. She did not receive IV t-PA due to late presentation.   Stroke:  Non-Dominant infarct felt to be embolic secondary to atrial fibrillation.  Resultant  Expressive aphasia  MRI - Moderate-size mid left frontal lobe infarct. Tiny amount of hemorrhage. Remote basal ganglia infarcts.  MRA - pending  Carotid Doppler - pending  2D Echo - pending  LDL - 91  HgbA1c pending  VTE prophylaxis -  SCDs Diet Heart Room service appropriate?: Yes; Fluid consistency:: Thin  aspirin 81 mg daily prior to admission, now on aspirin 325 mg daily. She was place on Eliquis inpatient in 02/2015 by cardiology for chronic afib and stroke prevention but per daughter her pcp discontinued almost immediately. Discussed restarting, she will discuss with Dr. Pearlean Brownie in the morning.    Patient counseled to be compliant with her antithrombotic medications  Ongoing aggressive stroke risk factor management  Therapy recommendations: PT recommends skilled nursing facility  Disposition:  Pending  Hypertension  Stable  Permissive hypertension (OK if < 220/120) but gradually normalize in 5-7 days  Hyperlipidemia  Home meds: No lipid lowering medications prior to admission  LDL 91, goal < 70  Add Lipitor  Continue statin at discharge    Other Stroke Risk Factors  Advanced age  Obesity, There is no weight on file to calculate BMI.   Hx stroke/TIA  Afib not anticoagulated   Other Active Problems  Sodium 134  Hospital day # 1  Delton See PA-C [facility] Pager [phone] 01/20/2016, 4:07 PM   Personally examined patient and images, and have participated in and made any corrections needed to history, physical, neuro exam,assessment and plan as stated above.  I have personally obtained the history, evaluated lab date, reviewed imaging studies and agree with radiology interpretations.   Patient has significant expressive aphasia. She has chronic afib not on eliquis. She was place on Eliquis inpatient in 02/2015 by cardiology for chronic  afib and stroke prevention but per daughter her pcp discontinued almost immediately. Discussed restarting, she will discuss with Dr. Pearlean Brownie in the morning.  Discussed with daughter at bedside.    Naomie Dean, MD Stroke Neurology [phone] [facility]     To contact Stroke Continuity provider, please refer to [email or web address]. After hours, contact General Neurology

## 2016-01-20 NOTE — Evaluation (Signed)
Physical Therapy Evaluation Patient Details Name: Sarah Stone MRN: [id number] DOB: [birth year] Today's Date: 01/20/2016   History of Present Illness    82 year old female with past medical history of atrial fibrillation, on AC with aspirin (used to be on eliquis but she has not taken this for last few months or so), hypertension who presented to [facility] ED for evaluation of difficulty speaking. Apparently she was in her usual state of health last reproted to have normal mental status at 6 pm the day before the admission and then later on in a night apparently had more trouble speaking. She does have chronic right facial droop per family report. No chest pain, shortness of breath. No loss of consciousness or falls.    Clinical Impression  Pt presents with mild limitations to functional mobility likely related to decr physical activity while in hospital.  Per daughter, she lives alone, has had no issues requiring incr assistance and hopes to return home once able.  Daughter states it would be difficult for family to provide 24 hour care and would need RNCM/CSW assistance to meet this need.  Anticipate pt will benefit from short SNF stay to address physical limitations to gait and mobility and daughter and pt are supportive of this plan.  Will initiate care in acute setting and participate in d/c planning as needed. See Care Plan for goals of care.     Follow Up Recommendations SNF    Equipment Recommendations  None recommended by PT    Recommendations for Other Services Speech consult (if not on case)     Precautions / Restrictions Precautions Precautions: Fall Precaution Comments: up with assistance      Mobility  Bed Mobility Overal bed mobility:  (not observed, pt up on [facility] with nurse on arrival, to chair )                Transfers Overall transfer level: Needs assistance Equipment used: 1 person hand held assist Transfers: Sit to/from Stand Sit to Stand: Min assist          General transfer comment: hand held assist to guide and gestures to cue, pt needs little help to lift off and to slow descent  Ambulation/Gait Ambulation/Gait assistance: Min assist Ambulation Distance (Feet): 100 Feet Assistive device: 1 person hand held assist Gait Pattern/deviations: Step-through pattern;Narrow base of support   Gait velocity interpretation: Below normal speed for age/gender General Gait Details: mild instability not needing correction, pt holds therapist hand, normally walks unaided  Stairs            Wheelchair Mobility    Modified Rankin (Stroke Patients Only) Modified Rankin (Stroke Patients Only) Pre-Morbid Rankin Score: No symptoms Modified Rankin: Slight disability     Balance Overall balance assessment: No apparent balance deficits (not formally assessed)                                           Pertinent Vitals/Pain Pain Assessment: No/denies pain    Home Living Family/patient expects to be discharged to:: Skilled nursing facility Living Arrangements: Alone Available Help at Discharge: Family;Available PRN/intermittently Type of Home: House       Home Layout: One level   Additional Comments: per daughter, pt lives alone, home is immaculate, and 24 care would be difficult for family to provide    Prior Function Level of Independence: Independent  Comments: not driving     Hand Dominance   Dominant Hand: Right    Extremity/Trunk Assessment   Upper Extremity Assessment: Defer to OT evaluation;Overall WFL for tasks assessed           Lower Extremity Assessment: Overall WFL for tasks assessed         Communication   Communication: Expressive difficulties  Cognition Arousal/Alertness: Awake/alert Behavior During Therapy: WFL for tasks assessed/performed Overall Cognitive Status: Difficult to assess                      General Comments      Exercises         Assessment/Plan    PT Assessment Patient needs continued PT services  PT Diagnosis Difficulty walking   PT Problem List Decreased knowledge of use of DME;Decreased mobility  PT Treatment Interventions DME instruction;Functional mobility training;Therapeutic activities;Neuromuscular re-education;Cognitive remediation;Patient/family education   PT Goals (Current goals can be found in the Care Plan section) Acute Rehab PT Goals Patient Stated Goal: go home PT Goal Formulation: With patient/family Time For Goal Achievement: 02/03/16 Potential to Achieve Goals: Good    Frequency Min 4X/week   Barriers to discharge Decreased caregiver support      Co-evaluation               End of Session Equipment Utilized During Treatment: Gait belt Activity Tolerance: Patient tolerated treatment well Patient left: in chair;with call bell/phone within reach;with family/visitor present Nurse Communication: Mobility status         Time: 5784-6962 PT Time Calculation (min) (ACUTE ONLY): 31 min   Charges:   PT Evaluation $PT Eval Low Complexity: 1 Procedure     PT G Codes:        Dennis Bast 01/20/2016, 3:03 PM

## 2016-01-20 NOTE — Progress Notes (Signed)
VASCULAR LAB PRELIMINARY  PRELIMINARY  PRELIMINARY  PRELIMINARY  Carotid duplex completed.    Preliminary report:  1-39% ICA plaquing.  Vertebral artery flow is antegrade.   Tameah Mihalko, RVT 01/20/2016, 11:36 AM

## 2016-01-21 ENCOUNTER — Inpatient Hospital Stay (HOSPITAL_COMMUNITY): Payer: Medicare Other

## 2016-01-21 DIAGNOSIS — I6789 Other cerebrovascular disease: Secondary | ICD-10-CM

## 2016-01-21 LAB — HEMOGLOBIN A1C
Hgb A1c MFr Bld: 6.2 % — ABNORMAL HIGH (ref 4.8–5.6)
MEAN PLASMA GLUCOSE: 131 mg/dL

## 2016-01-21 MED ORDER — ATENOLOL 25 MG PO TABS
25.0000 mg | ORAL_TABLET | Freq: Every day | ORAL | Status: DC
  Administered 2016-01-21 – 2016-01-23 (×3): 25 mg via ORAL
  Filled 2016-01-21 (×3): qty 1

## 2016-01-21 MED ORDER — QUETIAPINE FUMARATE 25 MG PO TABS
25.0000 mg | ORAL_TABLET | Freq: Every evening | ORAL | Status: DC | PRN
Start: 2016-01-21 — End: 2016-01-22
  Administered 2016-01-22: 25 mg via ORAL
  Filled 2016-01-21: qty 1

## 2016-01-21 NOTE — Progress Notes (Signed)
Pt's family concerned about placement.  Asking if a 24 hr sitter at home would be appropriate or if she needs SNF.  They have list of facilities and will make a decision about placement.

## 2016-01-21 NOTE — Evaluation (Addendum)
Occupational Therapy Evaluation Patient Details Name: Sarah Stone MRN: [id number] DOB: [birth year] Today's Date: 01/21/2016    History of Present Illness 82 year old female with past medical history of atrial fibrillation, on AC with aspirin (used to be on eliquis but she has not taken this for last few months or so), She does have chronic right facial droop per family report, hypertension, who presented to [facility] ED for evaluation of difficulty speaking. MRI revealed a moderate-size mid left frontal lobe infarct involving left frontaloperculum region with extension to left caudate body and remote basal ganglia infarcts.    Clinical Impression   Pt admitted with above. Pt independent with ADLs, PTA. Feel pt will benefit from acute OT to increase independence prior to d/c. Recommending SNF for d/c.    Follow Up Recommendations  SNF;Supervision/Assistance - 24 hour    Equipment Recommendations  Other (comment) (defer to next venue)    Recommendations for Other Services       Precautions / Restrictions Precautions Precautions: Fall Restrictions Weight Bearing Restrictions: No      Mobility Bed Mobility Overal bed mobility: Needs Assistance Bed Mobility: Supine to Sit;Sit to Supine     Supine to sit: Supervision Sit to supine: Modified independent (Device/Increase time)   General bed mobility comments: assist given to scoot HOB  Transfers Overall transfer level: Needs assistance Equipment used: 1 person hand held assist Transfers: Sit to/from Stand Sit to Stand: Min assist         General transfer comment: little help to lift and assist for balance    Balance    Decreased standing balance and assist for ambulation.                                        ADL Overall ADL's : Needs assistance/impaired     Grooming: Set up;Supervision/safety;Sitting;Bed level               Lower Body Dressing: Minimal assistance;Sit to/from stand   Toilet  Transfer: Ambulation;Moderate assistance (sit to stand from bed) Toilet Transfer Details (indicate cue type and reason): assist for transfer and ambulation         Functional mobility during ADLs: Minimal assistance       Vision     Perception     Praxis      Pertinent Vitals/Pain Pain Assessment: No/denies pain     Hand Dominance     Extremity/Trunk Assessment Upper Extremity Assessment Upper Extremity Assessment: weakness in bilateral shoulder flexors (able to withstand some resistance in shoulder flexors); decreased ROM in shoulders   Lower Extremity Assessment Lower Extremity Assessment: Defer to PT evaluation       Communication Communication Communication: Expressive difficulties   Cognition Arousal/Alertness: Awake/alert Behavior During Therapy: WFL for tasks assessed/performed Overall Cognitive Status: Difficult to assess due to impaired communication (not oriented to year or place; decreased short-term memory)                     General Comments       Exercises       Shoulder Instructions      Home Living Family/patient expects to be discharged to:: Private residence (pt reports home) Living Arrangements: Alone Available Help at Discharge: Family;Available PRN/intermittently Type of Home: House       Home Layout: One level     Bathroom Shower/Tub: Tub/shower unit   [employer]  Toilet: Standard         Additional Comments: bathroom information taken from a previous admission. Per PT eval: per daughter, pt lives alone, home is immaculate, and 24 care would be difficult for family to provide      Prior Functioning/Environment Level of Independence: Independent        Comments: not driving    OT Diagnosis: Generalized weakness   OT Problem List: Decreased strength;Decreased knowledge of precautions;Decreased knowledge of use of DME or AE;Decreased cognition;Impaired balance (sitting and/or standing);decreased range of motion    OT Treatment/Interventions: Self-care/ADL training;DME and/or AE instruction;Patient/family education;Balance training;Cognitive remediation/compensation;Therapeutic exercise;Therapeutic activities    OT Goals(Current goals can be found in the care plan section) Acute Rehab OT Goals Patient Stated Goal: go home OT Goal Formulation: With patient Time For Goal Achievement: 01/28/16 Potential to Achieve Goals: Good ADL Goals Pt Will Perform Lower Body Bathing: sit to/from stand;with set-up;with supervision Pt Will Perform Lower Body Dressing: with supervision;with set-up;sit to/from stand Pt Will Transfer to Toilet: ambulating;with supervision;with set-up Pt Will Perform Toileting - Clothing Manipulation and hygiene: with supervision;sit to/from stand;with set-up  OT Frequency: Min 2X/week   Barriers to D/C:            Co-evaluation              End of Session Equipment Utilized During Treatment: Gait belt Nurse Communication: Other (comment) (asked her about cognition)  Activity Tolerance: Patient tolerated treatment well Patient left: with call bell/phone within reach;in bed;with bed alarm set   Time: 1335-1350 OT Time Calculation (min): 15 min Charges:  OT General Charges $OT Visit: 1 Procedure OT Evaluation $OT Eval Moderate Complexity: 1 Procedure G-CodesEarlie Raveling OTR/L [phone] 01/21/2016, 2:40 PM

## 2016-01-21 NOTE — Clinical Documentation Improvement (Signed)
Internal Medicine  Please clarify the type of Acute Encephalopathy your patient has and document findings in next progress note; NOT in BPA drop down box. Thanks!   Metabolic  Hypertensive  Other  Clinically Undetermined  Supporting Information:  Acute encephalopathy: Likely has underlying dementia, undiagnosed, sundowning, currently pleasantly confused found in 1/23 progress note.  Please exercise your independent, professional judgment when responding. A specific answer is not anticipated or expected.  Thank You, Shellee Milo RN, BSN, CCDS Health Information Management [place] [phone]; Cell: [phone]

## 2016-01-21 NOTE — Progress Notes (Signed)
[facility]                                                                              Patient Demographics  Sarah Stone, is a 82 y.o. female, DOB - [birth year], YNW:[id number]  Admit date - 01/19/2016   Admitting Physician Alison Murray, MD  Outpatient Primary MD for the patient is No primary care provider on file.  LOS - 2   Chief Complaint  Patient presents with  . Altered Mental Status       Brief HPI   82 year old female with past medical history of atrial fibrillation, on AC with aspirin (used to be on eliquis but she has not taken this for last few months or so), hypertension who presented to [facility] ED for evaluation of difficulty speaking. Apparently she was in her usual state of health last reproted to have normal mental status at 6 pm the day before the admission and then later on in a night apparently had more trouble speaking. She does have chronic right facial droop per family report. No chest pain, shortness of breath. No loss of consciousness or falls.    Assessment & Plan    Principal Problem: Acute CVA- presenting with difficulty speaking, aphasia, stroke risk factors age, atrial fibrillation - Delayed presentation hence not a TPA candidate. Neurology was consulted. - MRI brain showed moderate sized mid left frontal lobe infarct involving the left frontal operculum region with extension to the left caudate body, tiny amount of hemorrhage associated with infarct of the left caudate body with minimal deformity of adjacent left lateral ventricle. - MRA showed moderate to marked focal stenosis mid aspect left middle cerebral artery, mild to moderate stenosis of proximal M1 segment, right middle cerebral artery.  - 2-D echo showed EF of 60-65%, no wall motion abnormalities, moderate TR - Carotid Dopplers showed 1-39% stenosis involving the right ICA and left ICA - Neurology recommended aspirin, NOAC deferred for now. Patient and daughter wants to discuss  regarding NOAC with Dr Pearlean Brownie - Lipid panel showed LDL 91 , goal less than 70, placed on statin - Hemoglobin A1c 6.2 - PT evaluation recommending skilled nursing facility  Lactic acidosis - Unclear etiology, no evidence of sepsis likely from dehydration. - KVO IV fluids   Benign essential HTN - BP now elevated, stop IV fluids  Chronic atrial fibrillation - CHADS vasc score 5 - On AC with aspirin only, has not taken eliquis in few months  - Anticoagulation per neurology - Rate controlled with metoprolol and atenolol (may start PO if passes swallow screen). As noted above, use only metoprolol rather than both metoprolol and atenolol.   Acute encephalopathy: Likely has underlying dementia, undiagnosed, sundowning, currently pleasantly confused - Placed on Seroquel as needed for agitation  Code Status: Full CODE STATUS  Family Communication: Discussed in detail with the patient, all imaging results, lab results explained to the patient . No family member at the bedside   Disposition Plan: Will need skilled nursing facility  Time Spent in minutes   Procedures  MRI brain Carotid Dopplers  Consults   Neurology  DVT Prophylaxis SCD's  Medications  Scheduled Meds: . aspirin EC  325 mg Oral Daily  . atorvastatin  20 mg Oral q1800  . latanoprost  1 drop Both Eyes Daily  . timolol  1 drop Right Eye Daily   Continuous Infusions: . sodium chloride 50 mL/hr at 01/20/16 1152   PRN Meds:.   Antibiotics   Anti-infectives    None        Subjective:   Sarah Stone was seen and examined today.  Pleasantly confused denies any pain  Patient denies dizziness, chest pain, shortness of breath, abdominal pain, N/V/D/C.Marland Kitchen No acute events overnight.    Objective:   Blood pressure 162/79, pulse 76, temperature 98 F (36.7 C), temperature source Oral, resp. rate 16, SpO2 98 %.  Wt Readings from Last 3 Encounters:  02/24/15 60.147 kg (132 lb 9.6 oz)    No  intake or output data in the 24 hours ending 01/21/16 1335  Exam  General: Alert, oriented x1  HEENT:  PERRLA, EOMI, Anicteric Sclera, mucous membranes moist.   Neck: Supple, no JVD, no masses  CVS: S1 S2 auscultated, no rubs, murmurs or gallops. Regular rate and rhythm.  Respiratory: Clear to auscultation bilaterally, no wheezing, rales or rhonchi  Abdomen: Soft, nontender, nondistended, + bowel sounds  Ext: no cyanosis clubbing or edema  Neuro: no new deficits  Skin: No rashes  Psych: Normal affect and demeanor, alert and oriented x1   Data Review   Micro Results Recent Results (from the past 240 hour(s))  Urine culture     Status: None   Collection Time: 01/19/16 11:19 AM  Result Value Ref Range Status   Specimen Description URINE, CLEAN CATCH  Final   Special Requests NONE  Final   Culture   Final    MULTIPLE SPECIES PRESENT, SUGGEST RECOLLECTION Performed at [facility]    Report Status 01/20/2016 FINAL  Final    Radiology Reports Dg Chest 2 View  01/19/2016  CLINICAL DATA:  Altered mental status and confusion since last night. EXAM: CHEST  2 VIEW COMPARISON:  Abdominal series radiograph dated 02/23/2015 FINDINGS: The cardiac silhouette is enlarged. Mediastinal contours appear intact. There is no evidence of focal airspace consolidation, pleural effusion or pneumothorax. Bilateral high density nodular opacities within the mid lung fields likely represent granulomas. Osseous structures are without acute abnormality. Soft tissues are grossly normal. IMPRESSION: Enlarged cardiac silhouette without evidence of pulmonary edema or focal airspace consolidation. Electronically Signed   By: Ted Mcalpine M.D.   On: 01/19/2016 10:44   Ct Head Wo Contrast  01/19/2016  CLINICAL DATA:  Altered mental status EXAM: CT HEAD WITHOUT CONTRAST TECHNIQUE: Contiguous axial images were obtained from the base of the skull through the vertex without intravenous contrast.  COMPARISON:  None. FINDINGS: There is mild generalized brain atrophy with commensurate dilatation of the ventricles and sulci. Chronic small vessel ischemic changes noted within the bilateral periventricular and subcortical white matter regions. Ill-defined low-density focus within the left frontal lobe is of uncertain age, more likely additional chronic ischemic change. There is no parenchymal mass or hemorrhage identified. No extra-axial hemorrhage. No acute osseous abnormality. Paranasal sinuses are clear. Superficial soft tissues are unremarkable. IMPRESSION: 1. Atrophy and chronic ischemic changes in the white matter. 2. Additional ill-defined low-density area within the left frontal lobe is of uncertain age, favored to be additional chronic ischemic change. Could confirm with brain MRI if clinically needed. 3. No intracranial mass or hemorrhage. Electronically Signed   By: Bary Richard  M.D.   On: 01/19/2016 10:51   Mr Shirlee Latch Wo Contrast  01/20/2016  CLINICAL DATA:  82 year old hypertensive female with difficulty speaking and acute left frontal lobe infarct. Subsequent encounter. EXAM: MRA HEAD WITHOUT CONTRAST TECHNIQUE: Angiographic images of the Circle of Willis were obtained using MRA technique without intravenous contrast. COMPARISON:  01/19/2016 head CT and brain MR. FINDINGS: Moderate to marked focal stenosis mid aspect left middle cerebral artery M1 segment. Mild to moderate stenosis of proximal M1 segment right middle cerebral artery. Moderate middle cerebral artery branch vessel narrowing bilaterally with decrease number of visualized left middle cerebral artery branches consistent with patient's acute infarct. Ectatic vertebral arteries with dominant right vertebral artery. Mild narrowing distal vertebral arteries without high-grade stenosis. Full extent of the left posterior inferior cerebellar artery not imaged. Narrowing of the distal aspect of the right posterior inferior  cerebellar artery. Ectatic basilar artery with mild narrowing distal aspect. Moderate tandem stenosis superior cerebellar artery bilaterally. Marked narrowing and irregularity of the posterior cerebral arteries bilaterally with almost complete occlusion of a majority of the right posterior cerebral artery and long segment of the mid portion of the left posterior cerebral artery. Narrowing/ occluded distal left posterior cerebral artery branches. No aneurysm noted. IMPRESSION: Moderate to marked focal stenosis mid aspect left middle cerebral artery M1 segment. Mild to moderate stenosis of proximal M1 segment right middle cerebral artery. Moderate middle cerebral artery branch vessel narrowing bilaterally with decrease number of visualized left middle cerebral artery branches consistent with patient's acute infarct. Ectatic vertebral arteries with dominant right vertebral artery. Mild narrowing distal vertebral arteries without high-grade stenosis. Full extent of the left posterior inferior cerebellar artery not imaged. Narrowing of the distal aspect of the right posterior inferior cerebellar artery. Ectatic basilar artery with mild narrowing distal aspect. Moderate tandem stenosis superior cerebellar artery bilaterally. Marked narrowing and irregularity of the posterior cerebral arteries bilaterally with almost complete occlusion of a majority of the right posterior cerebral artery and long segment of the mid portion of the left posterior cerebral artery. Narrowing/ occluded distal left posterior cerebral artery branches. Electronically Signed   By: Lacy Duverney M.D.   On: 01/20/2016 16:13   Mr Laqueta Jean JY Contrast  01/19/2016  CLINICAL DATA:  82 year old hypertensive female with sudden change in mental status yesterday with difficulty expressing self. Subsequent encounter. EXAM: MRI HEAD WITHOUT AND WITH CONTRAST TECHNIQUE: Multiplanar, multiecho pulse sequences of the brain and surrounding structures were  obtained without and with intravenous contrast. CONTRAST:  10mL MULTIHANCE GADOBENATE DIMEGLUMINE 529 MG/ML IV SOLN COMPARISON:  01/19/2016 CT.  No comparison MR. FINDINGS: Exam is motion degraded. Moderate-size mid left frontal lobe infarct involving left frontal operculum region with extension to left caudate body. Tiny amount of hemorrhage associated with infarct of the left caudate body with minimal deformity of the adjacent left lateral ventricle. Remote basal ganglia infarcts. Moderate small vessel disease changes. No intracranial mass or abnormal enhancement. Moderate global atrophy without hydrocephalus. Ectatic vertebral arteries with narrowing of left vertebral artery suspected. Ectatic basilar artery. Internal carotid arteries are patent bilaterally. Post lens replacement otherwise orbital structures unremarkable. Opacification post left ethmoid sinus air cells and partial opacification left mastoid air cells. Mild transverse ligament hypertrophy and mild narrowing upper cervical spine. Cervical medullary junction, pituitary region pineal region unremarkable. IMPRESSION: Exam is motion degraded. Moderate-size mid left frontal lobe infarct involving left frontal operculum region with extension to left caudate body. Tiny amount of hemorrhage associated with infarct of  the left caudate body with minimal deformity of the adjacent left lateral ventricle. Remote basal ganglia infarcts. Moderate small vessel disease changes. No intracranial mass or abnormal enhancement. Moderate global atrophy without hydrocephalus. Ectatic vertebral arteries with narrowing of left vertebral artery suspected. Ectatic basilar artery. Internal carotid arteries are patent bilaterally. Electronically Signed   By: Lacy Duverney M.D.   On: 01/19/2016 12:53    CBC  Recent Labs Lab 01/19/16 1011 01/19/16 1013  WBC 8.3  --   HGB 11.9* 12.9  HCT 34.2* 38.0  PLT 299  --   MCV 98.6  --   MCH 34.3*  --   MCHC 34.8  --   RDW  16.2*  --   LYMPHSABS 2.1  --   MONOABS 0.9  --   EOSABS 0.1  --   BASOSABS 0.0  --     Chemistries   Recent Labs Lab 01/19/16 1011 01/19/16 1013  NA 132* 134*  K 4.0 3.8  CL 96* 94*  CO2 27  --   GLUCOSE 117* 116*  BUN 14 15  CREATININE 0.53 0.60  CALCIUM 8.7*  --   AST 38  --   ALT 18  --   ALKPHOS 67  --   BILITOT 1.2  --    ------------------------------------------------------------------------------------------------------------------ CrCl cannot be calculated (Unknown ideal weight.). ------------------------------------------------------------------------------------------------------------------  Recent Labs  01/20/16 0710  HGBA1C 6.2*   ------------------------------------------------------------------------------------------------------------------  Recent Labs  01/20/16 0710  CHOL 138  HDL 38*  LDLCALC 91  TRIG 44  CHOLHDL 3.6   ------------------------------------------------------------------------------------------------------------------ No results for input(s): TSH, T4TOTAL, T3FREE, THYROIDAB in the last 72 hours.  Invalid input(s): FREET3 ------------------------------------------------------------------------------------------------------------------ No results for input(s): VITAMINB12, FOLATE, FERRITIN, TIBC, IRON, RETICCTPCT in the last 72 hours.  Coagulation profile  Recent Labs Lab 01/19/16 1011  INR 1.08    No results for input(s): DDIMER in the last 72 hours.  Cardiac Enzymes No results for input(s): CKMB, TROPONINI, MYOGLOBIN in the last 168 hours.  Invalid input(s): CK ------------------------------------------------------------------------------------------------------------------ Invalid input(s): POCBNP   Recent Labs  01/19/16 0954  GLUCAP 107*     Promyse Ardito M.D. [facility] 01/21/2016, 1:35 PM  Pager: [phone] Between 7am to 7pm - call Pager - [phone]  After 7pm go to [email or web address] -  password [email or web address]  Call night coverage person covering after 7pm

## 2016-01-21 NOTE — Progress Notes (Signed)
SLP Cancellation Note  Patient Details Name: Sarah Stone MRN: [id number] DOB: 09-26-1918   Cancelled treatment:       Reason Eval/Treat Not Completed: Patient at procedure or test/unavailable   Maddock Finigan, Riley Nearing 01/21/2016, 11:25 AM

## 2016-01-21 NOTE — Progress Notes (Signed)
OT Cancellation Note  Patient Details Name: Sarah Stone MRN: [id number] DOB: Apr 06, 1918   Cancelled Treatment:    Reason Eval/Treat Not Completed: Other (comment) Per orders, pt on Bed rest. Please update activity orders when appropriate for OT evaluation.  Earlie Raveling OTR/L [phone] 01/21/2016, 8:16 AM

## 2016-01-21 NOTE — Progress Notes (Signed)
STROKE TEAM PROGRESS NOTE   HISTORY OF PRESENT ILLNESS Sarah Stone is an 82 y.o. female with a past medical history significant for atrial fibrillation (was on apixaban in the past), transfer to [facility] for further evaluation and management of stroke. Patient is alert and awake but can not tell me why she is in the hospital. As per chart reviewed, patient is very functional and still capable of living alone, family talked to her on the phone last night and noted that she was having difficulty naming certain things. This worsened throughout the morning and patient was taken to [facility] for evaluation. Work up at [facility] included a brain MRI that I reviewed myself and demonstrated a moderate-size mid left frontal lobe infarct involving left frontaloperculum region with extension to left caudate body. Tiny amount of hemorrhage associated with infarct of the left caudate body. Presently, she denies HA, vertigo, double vision, difficulty swallowing, focal weakness, or vision impairment. Available serologies reviewed and significant only for Na 132.  Date last known well: uncertain Time last known well: uncertain tPA Given: no, late presentation   SUBJECTIVE (INTERVAL HISTORY) Daughter is at bedside. Speech is improving  OBJECTIVE Temp:  [97.8 F (36.6 C)-98.6 F (37 C)] 98.6 F (37 C) (01/23 1446) Pulse Rate:  [76-104] 82 (01/23 1446) Cardiac Rhythm:  [-] Atrial fibrillation (01/23 1217) Resp:  [16-18] 17 (01/23 1446) BP: (147-190)/(77-91) 147/78 mmHg (01/23 1446) SpO2:  [95 %-100 %] 96 % (01/23 1446)  CBC:   Recent Labs Lab 01/19/16 1011 01/19/16 1013  WBC 8.3  --   NEUTROABS 5.2  --   HGB 11.9* 12.9  HCT 34.2* 38.0  MCV 98.6  --   PLT 299  --     Basic Metabolic Panel:   Recent Labs Lab 01/19/16 1011 01/19/16 1013  NA 132* 134*  K 4.0 3.8  CL 96* 94*  CO2 27  --   GLUCOSE 117* 116*  BUN 14 15  CREATININE 0.53 0.60  CALCIUM 8.7*  --     Lipid Panel:     Component  Value Date/Time   CHOL 138 01/20/2016 0710   TRIG 44 01/20/2016 0710   HDL 38* 01/20/2016 0710   CHOLHDL 3.6 01/20/2016 0710   VLDL 9 01/20/2016 0710   LDLCALC 91 01/20/2016 0710   HgbA1c:  Lab Results  Component Value Date   HGBA1C 6.2* 01/20/2016   Urine Drug Screen: No results found for: LABOPIA, COCAINSCRNUR, LABBENZ, AMPHETMU, THCU, LABBARB    IMAGING  Dg Chest 2 View 01/19/2016   Enlarged cardiac silhouette without evidence of pulmonary edema or focal airspace consolidation.     Ct Head Wo Contrast 01/19/2016   1. Atrophy and chronic ischemic changes in the white matter.  2. Additional ill-defined low-density area within the left frontal lobe is of uncertain age, favored to be additional chronic ischemic change. Could confirm with brain MRI if clinically needed.  3. No intracranial mass or hemorrhage.     Mr Lodema Pilot Contrast 01/19/2016   Exam is motion degraded. Moderate-size mid left frontal lobe infarct involving left frontal operculum region with extension to left caudate body. Tiny amount of hemorrhage associated with infarct of the left caudate body with minimal deformity of the adjacent left lateral ventricle. Remote basal ganglia infarcts. Moderate small vessel disease changes. No intracranial mass or abnormal enhancement. Moderate global atrophy without hydrocephalus. Ectatic vertebral arteries with narrowing of left vertebral artery suspected. Ectatic basilar artery. Internal carotid arteries are patent bilaterally.  PHYSICAL EXAM  PHYSICAL EXAM Physical exam: Exam: Gen: NAD Eyes: anicteric sclerae, moist conjunctivae                    CV: no MRG, no carotid bruits, no peripheral edema Mental Status: Alert, follows commands, good historian  Neuro: Detailed Neurologic Exam  Speech:    Patient with expressive aphasia. Difficulty naming objects. Perseveration. Can repeat. Follows simple commands. Can read "close your eyes" but doesn't perform the  task. She denies any problems with speaking.   Cranial Nerves:    The pupils are equal, round, and reactive to light.. Attempted, Fundi not visualized.  EOMI. No gaze preference. Blinks to threat bilaterally.  Face symmetric, Tongue midline. Hearing intact to voice. Shoulder shrug intact  Motor Observation:    no involuntary movements noted. Tone appears normal.     Strength:    Difficult to fully examine due to cognitive deficits and sundowning, strength appears equal and intact in all extremities.      Sensation: appears intact, withdraws to pain x4  Plantars downgoing.   Coordination: No dysmetria  Gait: slight wide based, ambulating around the room independently  ASSESSMENT/PLAN Ms. Sarah Stone is a 82 y.o. female with history of hypertension, chronic atrial fibrillation, and previous strokes by MRI presenting with speech difficulties. She did not receive IV t-PA due to late presentation.   Stroke:  Non-Dominant infarct felt to be embolic secondary to atrial fibrillation.  Resultant  Expressive aphasia  MRI - Moderate-size mid left frontal lobe infarct. Tiny amount of hemorrhage. Remote basal ganglia infarcts.  MRA - pending  Carotid Doppler - pending  2D Echo - pending  LDL - 91  HgbA1c pending  VTE prophylaxis -  SCDs Diet Heart Room service appropriate?: Yes; Fluid consistency:: Thin  aspirin 81 mg daily prior to admission, now on aspirin 325 mg daily. She was place on Eliquis inpatient in 02/2015 by cardiology for chronic afib and stroke prevention but per daughter her pcp discontinued almost immediately. Discussed restarting, she will discuss with Dr. Pearlean Brownie in the morning.   Patient counseled to be compliant with her antithrombotic medications  Ongoing aggressive stroke risk factor management  Therapy recommendations: PT recommends skilled nursing facility  Disposition:  Pending  Hypertension  Stable  Permissive hypertension (OK if < 220/120) but  gradually normalize in 5-7 days  Hyperlipidemia  Home meds: No lipid lowering medications prior to admission  LDL 91, goal < 70  Add Lipitor  Continue statin at discharge    Other Stroke Risk Factors  Advanced age  Obesity, There is no weight on file to calculate BMI.   Hx stroke/TIA  Afib not anticoagulated   Other Active Problems  Sodium 134  Hospital day # 2     I personally examined patient and images, and have participated in and made any corrections needed to history, physical, neuro exam,assessment and plan as stated above.  I have personally obtained the history, evaluated lab date, reviewed imaging studies and agree with radiology interpretations.   Patient has mild expressive aphasia. She has chronic afib not on eliquis. She was place on Eliquis inpatient in 02/2015 by cardiology for chronic afib and stroke prevention but per daughter her pcp discontinued almost immediately. Discussed restarting, I spoke to Dr. Nila Nephew patient's primary physician who is now in agreement with starting eliquis Discussed with daughter at bedside.   Delia Heady, MD Stroke Neurology [phone] [facility]     To contact Stroke  Continuity provider, please refer to [email or web address] After hours, contact General Neurology

## 2016-01-21 NOTE — NC FL2 (Signed)
  [place] MEDICAID FL2 LEVEL OF CARE SCREENING TOOL     IDENTIFICATION  Patient Name: Sarah Stone Birthdate: [birth year] Sex: female Admission Date (Current Location): 01/19/2016  [facility] and [place] Number:  Producer, television/film/video and Address:  [facility], [address] [place], [place] [address]      Provider Number: [id number]  Attending Physician Name and Address:  Cathren Harsh, MD  Relative Name and Phone Number:       Current Level of Care: Hospital Recommended Level of Care: Skilled Nursing Facility Prior Approval Number:    Date Approved/Denied:   PASRR Number: [id number]  Discharge Plan: SNF    Current Diagnoses: Patient Active Problem List   Diagnosis Date Noted  . Ischemic stroke of frontal lobe (HCC)   . CVA (cerebral infarction) 01/19/2016  . Benign essential HTN 01/19/2016  . Chronic atrial fibrillation (HCC) 01/19/2016  . Difficulty speaking   . Lactic acidosis     Orientation RESPIRATION BLADDER Height & Weight    Self  Normal Continent  (165.1 cm) 132 lbs.  BEHAVIORAL SYMPTOMS/MOOD NEUROLOGICAL BOWEL NUTRITION STATUS   (NONE)  (NONE) Continent Diet (HEART HEALTHY )  AMBULATORY STATUS COMMUNICATION OF NEEDS Skin   Limited Assist Verbally Normal                       Personal Care Assistance Level of Assistance  Bathing, Dressing Bathing Assistance: Limited assistance   Dressing Assistance: Limited assistance     Functional Limitations Info   (NONE)          SPECIAL CARE FACTORS FREQUENCY  PT (By licensed PT)     PT Frequency: 4              Contractures      Additional Factors Info  Code Status, Allergies Code Status Info: FULL CODE  Allergies Info: N/A           Current Medications (01/21/2016):  This is the current hospital active medication list Current Facility-Administered Medications  Medication Dose Route Frequency Provider Last Rate Last Dose  . 0.9 %  sodium  chloride infusion   Intravenous Continuous Alison Murray, MD 50 mL/hr at 01/20/16 1152    . aspirin EC tablet 325 mg  325 mg Oral Daily Ripudeep Jenna Luo, MD   325 mg at 01/21/16 1044  . atorvastatin (LIPITOR) tablet 20 mg  20 mg Oral q1800 Ripudeep Jenna Luo, MD   20 mg at 01/20/16 1739  . latanoprost (XALATAN) 0.005 % ophthalmic solution 1 drop  1 drop Both Eyes Daily Alison Murray, MD   1 drop at 01/21/16 1045  . timolol (TIMOPTIC) 0.5 % ophthalmic solution 1 drop  1 drop Right Eye Daily Alison Murray, MD   1 drop at 01/21/16 1045     Discharge Medications: Please see discharge summary for a list of discharge medications.  Relevant Imaging Results:  Relevant Lab Results:   Additional Information SSN [id number]  Vaughan Browner, LCSW

## 2016-01-21 NOTE — Clinical Social Work Placement (Signed)
   CLINICAL SOCIAL WORK PLACEMENT  NOTE  Date:  01/21/2016  Patient Details  Name: Sarah Stone MRN: [id number] Date of Birth: 1918-01-20  Clinical Social Work is seeking post-discharge placement for this patient at the Skilled  Nursing Facility level of care (*CSW will initial, date and re-position this form in  chart as items are completed):  Yes   Patient/family provided with [facility] Clinical Social Work Department's list of facilities offering this level of care within the geographic area requested by the patient (or if unable, by the patient's family).  Yes   Patient/family informed of their freedom to choose among providers that offer the needed level of care, that participate in Medicare, Medicaid or managed care program needed by the patient, have an available bed and are willing to accept the patient.  Yes   Patient/family informed of [facility]'s ownership interest in [facility] and [facility], as well as of the fact that they are under no obligation to receive care at these facilities.  PASRR submitted to EDS on 01/21/16     PASRR number received on 01/21/16     Existing PASRR number confirmed on       FL2 transmitted to all facilities in geographic area requested by pt/family on 01/21/16     FL2 transmitted to all facilities within larger geographic area on       Patient informed that his/her managed care company has contracts with or will negotiate with certain facilities, including the following:            Patient/family informed of bed offers received.  Patient chooses bed at       Physician recommends and patient chooses bed at      Patient to be transferred to   on  .  Patient to be transferred to facility by       Patient family notified on   of transfer.  Name of family member notified:        PHYSICIAN Please sign FL2     Additional Comment:    _______________________________________________ Vaughan Browner, LCSW 01/21/2016,  12:25 PM

## 2016-01-21 NOTE — Clinical Social Work Note (Signed)
Clinical Social Worker met with patient and family (dtr and son-in-law) at bedside for a lengthy discussion in regards to short-term vs long-term care. CSW reviewed and provided SNF list with bed offers and answered various questions in reference to long-term care at SNF vs private duty care. Private duty list also provided as family would like to consider other care options for patient returning home.   CSW contacted several facilities to make inquiry for long-term care beds. [facility], [facility] and [facility] currently have LTC beds available. Information related to family as CSW informed them to follow up with desires facilities for financial details.   Family planning to tour facilities for additional information and contact CSW with decision. CSW encouraged family to contact CSW as needed. Patient lying in bed resting during conversation. No further concerns reported at this time.    CSW remains available as needed.   Glendon Axe, MSW, [place] [phone] 01/21/2016 5:16 PM

## 2016-01-21 NOTE — Progress Notes (Signed)
  Echocardiogram 2D Echocardiogram has been performed.  Delcie Roch 01/21/2016, 12:07 PM

## 2016-01-21 NOTE — Clinical Social Work Note (Signed)
Clinical Social Work Assessment  Patient Details  Name: Sarah Stone MRN: [id number] Date of Birth: 1918/09/14  Date of referral:  01/21/16               Reason for consult:  Facility Placement, Discharge Planning                Permission sought to share information with:  Family Supports, Case Production designer, theatre/television/film, Magazine features editor Permission granted to share information::  Yes, Verbal Permission Granted  Name::      [name])  Agency::   (SNF)  Relationship::   (Daughter )  Contact Information:   [phone])  Housing/Transportation Living arrangements for the past 2 months:  Single Family Home Source of Information:  Adult Children Patient Interpreter Needed:  None Criminal Activity/Legal Involvement Pertinent to Current Situation/Hospitalization:  No - Comment as needed Significant Relationships:  Adult Children Lives with:  Self Do you feel safe going back to the place where you live?  No Need for family participation in patient care:  Yes (Comment)  Care giving concerns:  Patient lives alone and requiring short term rehab placement at SNF.    Social Worker assessment / plan:  Visual merchandiser spoke with patient's daughter, Dois Davenport in reference to post-acute placement for SNF. CSW introduced CSW role and SNF process. Pt's dtr reported that patient lives alone and would benefit from SNF before returning back home. Pt's dtr is NOT famailir with SNF's in the area however agreeable to SNF placement and plans to review SNF list and bed offers on arrival. No further concerns reported by pt's dtr at this time.Pt is currently without sitter/restraints.  CSW will continue to follow pt and pt's family for continued support and to facilitate pt's discharge needs once medically stable.    Employment status:  Retired Health and safety inspector:  Medicare PT Recommendations:  Skilled Nursing Facility Information / Referral to community resources:  Skilled Nursing  Facility  Patient/Family's Response to care: Pt confused. Pt's dtr agreeable to SNF placement. Pt's dtr supportive and involved in pt's care. Pt's dtr appreciated social work intervention and plans to meet with CSW around 3PM today, 1/23.  Patient/Family's Understanding of and Emotional Response to Diagnosis, Current Treatment, and Prognosis:  Pt's dtr knowledgeable of medical work up and understanding of post-acute placement.   Emotional Assessment Appearance:  Appears stated age Attitude/Demeanor/Rapport:  Unable to Assess Affect (typically observed):  Unable to Assess Orientation:  Oriented to Self Alcohol / Substance use:  Not Applicable Psych involvement (Current and /or in the community):  No (Comment)  Discharge Needs  Concerns to be addressed:  Care Coordination Readmission within the last 30 days:  No Current discharge risk:  Cognitively Impaired, Dependent with Mobility, Lives alone Barriers to Discharge:  Continued Medical Work up   [employer], MSW, LCSWA [phone] 01/21/2016 12:24 PM

## 2016-01-22 MED ORDER — SODIUM CHLORIDE 0.9 % IV SOLN
INTRAVENOUS | Status: DC
  Administered 2016-01-22: 18:00:00 via INTRAVENOUS

## 2016-01-22 MED ORDER — APIXABAN 2.5 MG PO TABS
2.5000 mg | ORAL_TABLET | Freq: Two times a day (BID) | ORAL | Status: DC
  Administered 2016-01-22 – 2016-01-23 (×3): 2.5 mg via ORAL
  Filled 2016-01-22 (×4): qty 1

## 2016-01-22 NOTE — Progress Notes (Signed)
Physical Therapy Treatment Patient Details Name: Sarah Stone MRN: [id number] DOB: [birth year] Today's Date: 01/22/2016    History of Present Illness 82 year old female with past medical history of atrial fibrillation, on AC with aspirin (used to be on eliquis but she has not taken this for last few months or so), She does have chronic right facial droop per family report, hypertension, who presented to [facility] ED for evaluation of difficulty speaking. MRI revealed a moderate-size mid left frontal lobe infarct involving left frontaloperculum region with extension to left caudate body and remote basal ganglia infarcts.     PT Comments    Patient required incr assist with balance and walking compared to evaluation 1/22. Unable to accurately state DOB and words frequently unintelligible.   Follow Up Recommendations  SNF     Equipment Recommendations  None recommended by PT    Recommendations for Other Services       Precautions / Restrictions Precautions Precautions: Fall Precaution Comments: up with assistance    Mobility  Bed Mobility                  Transfers Overall transfer level: Needs assistance Equipment used: 1 person hand held assist Transfers: Sit to/from Stand Sit to Stand: Min assist         General transfer comment: x 3; pushes up from chair bil UEs, reaching for support as coming to stand with Lt lean; upon standing, posterior lean; on thrid attempt more midline, however still required steadying assist  Ambulation/Gait Ambulation/Gait assistance: Mod assist Ambulation Distance (Feet): 70 Feet Assistive device: 1 person hand held assist Gait Pattern/deviations: Step-through pattern;Decreased stride length;Decreased weight shift to right;Narrow base of support;Staggering left;Drifts right/left   Gait velocity interpretation: Below normal speed for age/gender General Gait Details: very unsteady with up to mod assist to correct balance errors (lots of  staggering, nearly scissoring); appears lethargic   Stairs            Wheelchair Mobility    Modified Rankin (Stroke Patients Only) Modified Rankin (Stroke Patients Only) Pre-Morbid Rankin Score: No symptoms Modified Rankin: Moderately severe disability     Balance Overall balance assessment: Needs assistance         Standing balance support: Single extremity supported Standing balance-Leahy Scale: Poor                 High Level Balance Comments: pre-gait mini-squats x 10; after walk and rest, marching with pt poorly lifting LLE compared to Rt; drifts/leans left and posterior with very delayed balance reactions    Cognition Arousal/Alertness: Awake/alert Behavior During Therapy: WFL for tasks assessed/performed Overall Cognitive Status: Difficult to assess                      Exercises      General Comments        Pertinent Vitals/Pain Pain Assessment: Faces Pain Score: 2  Faces Pain Scale: No hurt Pain Intervention(s): Monitored during session;Repositioned    Home Living     Available Help at Discharge: Family;Available PRN/intermittently Type of Home: House              Prior Function            PT Goals (current goals can now be found in the care plan section) Acute Rehab PT Goals Patient Stated Goal: unable to state/express Time For Goal Achievement: 02/03/16 Progress towards PT goals: Not progressing toward goals - comment (?more lethargic)  Frequency  Min 3X/week    PT Plan Current plan remains appropriate    Co-evaluation             End of Session Equipment Utilized During Treatment: Gait belt Activity Tolerance: Patient limited by lethargy (eyes open throughout, very delayed responses) Patient left: in chair;with call bell/phone within reach;with chair alarm set     Time: 1137-1155 PT Time Calculation (min) (ACUTE ONLY): 18 min  Charges:  $Gait Training: 8-22 mins                    G Codes:       Sarah Stone 02/20/2016, 12:12 PM Pager [phone]

## 2016-01-22 NOTE — Progress Notes (Signed)
SLP Cancellation Note  Patient Details Name: Tanea Moga MRN: [id number] DOB: May 02, 1918   Cancelled treatment:       Reason Eval/Treat Not Completed: Fatigue/lethargy limiting ability to participate. Discussed pt with RN - she is currently too lethargic to participate in swallow evaluation. RN reports poor oral clearance with pills earlier today, administered whole and with puree. Will try to return later this afternoon for assessment (RN to page if she becomes more alert). Should evaluation not be completed this afternoon, would try crushing medications in puree.   Maxcine Ham, M.A. CCC-SLP [phone]  Maxcine Ham 01/22/2016, 1:56 PM

## 2016-01-22 NOTE — Care Management Important Message (Signed)
Important Message  Patient Details  Name: Sarah Stone MRN: [id number] Date of Birth: 10-11-1918   Medicare Important Message Given:  Yes    Marabelle Cushman P Brandan Robicheaux 01/22/2016, 3:38 PM

## 2016-01-22 NOTE — Progress Notes (Signed)
[facility]                                                                              Patient Demographics  Sarah Stone, is a 82 y.o. female, DOB - December 11, 1918, ZOX:[id number]  Admit date - 01/19/2016   Admitting Physician Alison Murray, MD  Outpatient Primary MD for the patient is No primary care provider on file.  LOS - 3   Chief Complaint  Patient presents with  . Altered Mental Status       Brief HPI   82 year old female with past medical history of atrial fibrillation, on AC with aspirin (used to be on eliquis but she has not taken this for last few months or so), hypertension who presented to [facility] ED for evaluation of difficulty speaking. Apparently she was in her usual state of health last reproted to have normal mental status at 6 pm the day before the admission and then later on in a night apparently had more trouble speaking. She does have chronic right facial droop per family report. No chest pain, shortness of breath. No loss of consciousness or falls.    Assessment & Plan    Principal Problem: Acute CVA- presenting with difficulty speaking, aphasia, stroke risk factors age, atrial fibrillation - Delayed presentation hence not a TPA candidate. Neurology was consulted. - MRI brain showed moderate sized mid left frontal lobe infarct involving the left frontal operculum region with extension to the left caudate body, tiny amount of hemorrhage associated with infarct of the left caudate body with minimal deformity of adjacent left lateral ventricle. - MRA showed moderate to marked focal stenosis mid aspect left middle cerebral artery, mild to moderate stenosis of proximal M1 segment, right middle cerebral artery.  - 2-D echo showed EF of 60-65%, no wall motion abnormalities, moderate TR - Carotid Dopplers showed 1-39% stenosis involving the right ICA and left ICA - Neurology recommended restarting eliquis after discussion with family - Lipid panel showed  LDL 91 , goal less than 70, placed on statin - Hemoglobin A1c 6.2 - PT evaluation recommending skilled nursing facility - Today having difficulty swallowing pills, will have speech therapy reevaluate again  Lactic acidosis - Unclear etiology, no evidence of sepsis likely from dehydration. - KVO IV fluids   Benign essential HTN - BP now elevated, stop IV fluids  Chronic atrial fibrillation - CHADS vasc score 5 - eliquis restarted - Continue atenolol  Acute encephalopathy: Likely has underlying dementia, undiagnosed, sundowning, currently pleasantly confused - Placed on Seroquel as needed for agitation  Code Status: Full CODE STATUS  Family Communication: Discussed in detail with the patient, all imaging results, lab results explained to the patient . No family member at the bedside   Disposition Plan: Will need skilled nursing facility  Time Spent in minutes   Procedures  MRI brain Carotid Dopplers 2-D echo  Consults   Neurology  DVT Prophylaxis eliquis  Medications  Scheduled Meds: . apixaban  2.5 mg Oral BID  . aspirin EC  325 mg Oral Daily  . atenolol  25 mg Oral Daily  . atorvastatin  20 mg  Oral q1800  . latanoprost  1 drop Both Eyes Daily  . timolol  1 drop Right Eye Daily   Continuous Infusions:   PRN Meds:.   Antibiotics   Anti-infectives    None        Subjective:   Sarah Stone was seen and examined today.  Pleasantly confused,  Patient denies dizziness, chest pain, shortness of breath, abdominal pain, N/V/D/C.Marland Kitchen No acute events overnight.  Per RN having difficulty swallowing pills  Objective:   Blood pressure 159/93, pulse 83, temperature 98.2 F (36.8 C), temperature source Oral, resp. rate 17, height 5' 1.81" (1.57 m), weight 60.1 kg (132 lb 7.9 oz), SpO2 96 %.  Wt Readings from Last 3 Encounters:  01/22/16 60.1 kg (132 lb 7.9 oz)  02/24/15 60.147 kg (132 lb 9.6 oz)     Intake/Output Summary (Last 24 hours) at  01/22/16 1234 Last data filed at 01/22/16 0958  Gross per 24 hour  Intake      0 ml  Output      0 ml  Net      0 ml    Exam  General: Alert, oriented x1  HEENT:  PERRLA, EOMI  Neck: Supple, no JVD, no masses  CVS: irregularly irregular.  Respiratory: CTAB  Abdomen: Soft, nontender, nondistended, + bowel sounds  Ext: no cyanosis clubbing or edema  Neuro: no new deficits  Skin: No rashes  Psych: Normal affect and demeanor, alert and oriented x1   Data Review   Micro Results Recent Results (from the past 240 hour(s))  Urine culture     Status: None   Collection Time: 01/19/16 11:19 AM  Result Value Ref Range Status   Specimen Description URINE, CLEAN CATCH  Final   Special Requests NONE  Final   Culture   Final    MULTIPLE SPECIES PRESENT, SUGGEST RECOLLECTION Performed at [facility]    Report Status 01/20/2016 FINAL  Final    Radiology Reports Dg Chest 2 View  01/19/2016  CLINICAL DATA:  Altered mental status and confusion since last night. EXAM: CHEST  2 VIEW COMPARISON:  Abdominal series radiograph dated 02/23/2015 FINDINGS: The cardiac silhouette is enlarged. Mediastinal contours appear intact. There is no evidence of focal airspace consolidation, pleural effusion or pneumothorax. Bilateral high density nodular opacities within the mid lung fields likely represent granulomas. Osseous structures are without acute abnormality. Soft tissues are grossly normal. IMPRESSION: Enlarged cardiac silhouette without evidence of pulmonary edema or focal airspace consolidation. Electronically Signed   By: Ted Mcalpine M.D.   On: 01/19/2016 10:44   Ct Head Wo Contrast  01/19/2016  CLINICAL DATA:  Altered mental status EXAM: CT HEAD WITHOUT CONTRAST TECHNIQUE: Contiguous axial images were obtained from the base of the skull through the vertex without intravenous contrast. COMPARISON:  None. FINDINGS: There is mild generalized brain atrophy with commensurate  dilatation of the ventricles and sulci. Chronic small vessel ischemic changes noted within the bilateral periventricular and subcortical white matter regions. Ill-defined low-density focus within the left frontal lobe is of uncertain age, more likely additional chronic ischemic change. There is no parenchymal mass or hemorrhage identified. No extra-axial hemorrhage. No acute osseous abnormality. Paranasal sinuses are clear. Superficial soft tissues are unremarkable. IMPRESSION: 1. Atrophy and chronic ischemic changes in the white matter. 2. Additional ill-defined low-density area within the left frontal lobe is of uncertain age, favored to be additional chronic ischemic change. Could confirm with brain MRI if clinically needed. 3. No intracranial mass or  hemorrhage. Electronically Signed   By: Bary Richard M.D.   On: 01/19/2016 10:51   Mr Shirlee Latch Wo Contrast  01/20/2016  CLINICAL DATA:  82 year old hypertensive female with difficulty speaking and acute left frontal lobe infarct. Subsequent encounter. EXAM: MRA HEAD WITHOUT CONTRAST TECHNIQUE: Angiographic images of the Circle of Willis were obtained using MRA technique without intravenous contrast. COMPARISON:  01/19/2016 head CT and brain MR. FINDINGS: Moderate to marked focal stenosis mid aspect left middle cerebral artery M1 segment. Mild to moderate stenosis of proximal M1 segment right middle cerebral artery. Moderate middle cerebral artery branch vessel narrowing bilaterally with decrease number of visualized left middle cerebral artery branches consistent with patient's acute infarct. Ectatic vertebral arteries with dominant right vertebral artery. Mild narrowing distal vertebral arteries without high-grade stenosis. Full extent of the left posterior inferior cerebellar artery not imaged. Narrowing of the distal aspect of the right posterior inferior cerebellar artery. Ectatic basilar artery with mild narrowing distal aspect. Moderate tandem  stenosis superior cerebellar artery bilaterally. Marked narrowing and irregularity of the posterior cerebral arteries bilaterally with almost complete occlusion of a majority of the right posterior cerebral artery and long segment of the mid portion of the left posterior cerebral artery. Narrowing/ occluded distal left posterior cerebral artery branches. No aneurysm noted. IMPRESSION: Moderate to marked focal stenosis mid aspect left middle cerebral artery M1 segment. Mild to moderate stenosis of proximal M1 segment right middle cerebral artery. Moderate middle cerebral artery branch vessel narrowing bilaterally with decrease number of visualized left middle cerebral artery branches consistent with patient's acute infarct. Ectatic vertebral arteries with dominant right vertebral artery. Mild narrowing distal vertebral arteries without high-grade stenosis. Full extent of the left posterior inferior cerebellar artery not imaged. Narrowing of the distal aspect of the right posterior inferior cerebellar artery. Ectatic basilar artery with mild narrowing distal aspect. Moderate tandem stenosis superior cerebellar artery bilaterally. Marked narrowing and irregularity of the posterior cerebral arteries bilaterally with almost complete occlusion of a majority of the right posterior cerebral artery and long segment of the mid portion of the left posterior cerebral artery. Narrowing/ occluded distal left posterior cerebral artery branches. Electronically Signed   By: Lacy Duverney M.D.   On: 01/20/2016 16:13   Mr Laqueta Jean ZO Contrast  01/19/2016  CLINICAL DATA:  82 year old hypertensive female with sudden change in mental status yesterday with difficulty expressing self. Subsequent encounter. EXAM: MRI HEAD WITHOUT AND WITH CONTRAST TECHNIQUE: Multiplanar, multiecho pulse sequences of the brain and surrounding structures were obtained without and with intravenous contrast. CONTRAST:  10mL MULTIHANCE GADOBENATE DIMEGLUMINE  529 MG/ML IV SOLN COMPARISON:  01/19/2016 CT.  No comparison MR. FINDINGS: Exam is motion degraded. Moderate-size mid left frontal lobe infarct involving left frontal operculum region with extension to left caudate body. Tiny amount of hemorrhage associated with infarct of the left caudate body with minimal deformity of the adjacent left lateral ventricle. Remote basal ganglia infarcts. Moderate small vessel disease changes. No intracranial mass or abnormal enhancement. Moderate global atrophy without hydrocephalus. Ectatic vertebral arteries with narrowing of left vertebral artery suspected. Ectatic basilar artery. Internal carotid arteries are patent bilaterally. Post lens replacement otherwise orbital structures unremarkable. Opacification post left ethmoid sinus air cells and partial opacification left mastoid air cells. Mild transverse ligament hypertrophy and mild narrowing upper cervical spine. Cervical medullary junction, pituitary region pineal region unremarkable. IMPRESSION: Exam is motion degraded. Moderate-size mid left frontal lobe infarct involving left frontal operculum region with extension to left caudate  body. Tiny amount of hemorrhage associated with infarct of the left caudate body with minimal deformity of the adjacent left lateral ventricle. Remote basal ganglia infarcts. Moderate small vessel disease changes. No intracranial mass or abnormal enhancement. Moderate global atrophy without hydrocephalus. Ectatic vertebral arteries with narrowing of left vertebral artery suspected. Ectatic basilar artery. Internal carotid arteries are patent bilaterally. Electronically Signed   By: Lacy Duverney M.D.   On: 01/19/2016 12:53    CBC  Recent Labs Lab 01/19/16 1011 01/19/16 1013  WBC 8.3  --   HGB 11.9* 12.9  HCT 34.2* 38.0  PLT 299  --   MCV 98.6  --   MCH 34.3*  --   MCHC 34.8  --   RDW 16.2*  --   LYMPHSABS 2.1  --   MONOABS 0.9  --   EOSABS 0.1  --   BASOSABS 0.0  --      Chemistries   Recent Labs Lab 01/19/16 1011 01/19/16 1013  NA 132* 134*  K 4.0 3.8  CL 96* 94*  CO2 27  --   GLUCOSE 117* 116*  BUN 14 15  CREATININE 0.53 0.60  CALCIUM 8.7*  --   AST 38  --   ALT 18  --   ALKPHOS 67  --   BILITOT 1.2  --    ------------------------------------------------------------------------------------------------------------------ estimated creatinine clearance is 35.8 mL/min (by C-G formula based on Cr of 0.6). ------------------------------------------------------------------------------------------------------------------  Recent Labs  01/20/16 0710  HGBA1C 6.2*   ------------------------------------------------------------------------------------------------------------------  Recent Labs  01/20/16 0710  CHOL 138  HDL 38*  LDLCALC 91  TRIG 44  CHOLHDL 3.6   ------------------------------------------------------------------------------------------------------------------ No results for input(s): TSH, T4TOTAL, T3FREE, THYROIDAB in the last 72 hours.  Invalid input(s): FREET3 ------------------------------------------------------------------------------------------------------------------ No results for input(s): VITAMINB12, FOLATE, FERRITIN, TIBC, IRON, RETICCTPCT in the last 72 hours.  Coagulation profile  Recent Labs Lab 01/19/16 1011  INR 1.08    No results for input(s): DDIMER in the last 72 hours.  Cardiac Enzymes No results for input(s): CKMB, TROPONINI, MYOGLOBIN in the last 168 hours.  Invalid input(s): CK ------------------------------------------------------------------------------------------------------------------ Invalid input(s): POCBNP  No results for input(s): GLUCAP in the last 72 hours.   RAI,RIPUDEEP M.D. [facility] 01/22/2016, 12:34 PM  Pager: [phone] Between 7am to 7pm - call Pager - [phone]  After 7pm go to [email or web address] - password [email or web address]  Call night coverage person covering  after 7pm

## 2016-01-22 NOTE — Clinical Social Work Note (Signed)
Patient chooses bed offer at [facility].  CSW remains available as needed.  Derenda Fennel, MSW, LCSWA [phone] 01/22/2016 12:07 PM

## 2016-01-22 NOTE — Progress Notes (Signed)
ANTICOAGULATION CONSULT NOTE - Initial Consult  Pharmacy Consult:  Eliquis Indication:  Non-valvular Afib  No Known Allergies  Patient Measurements: Height: 5' 1.81" (157 cm) Weight: 132 lb 7.9 oz (60.1 kg) IBW/kg (Calculated) : 49.67  Vital Signs: Temp: 98.2 F (36.8 C) (01/24 0956) Temp Source: Oral (01/24 0956) BP: 159/93 mmHg (01/24 0956) Pulse Rate: 83 (01/24 0956)  Labs: No results for input(s): HGB, HCT, PLT, APTT, LABPROT, INR, HEPARINUNFRC, CREATININE, CKTOTAL, CKMB, TROPONINI in the last 72 hours.  Estimated Creatinine Clearance: 35.8 mL/min (by C-G formula based on Cr of 0.6).   Medical History: History reviewed. No pertinent past medical history.    Assessment: 89 YOF with history of Afib presented with difficulty speaking.  MRI showed a moderate-size infarct with a tiny amount of hemorrhage.  Patient was taken off of Eliquis several months ago and after much discussion between providers, patient to resume Eliquis for secondary stroke prevention.  Based on patient specific parameters, will proceed with reduced Eliquis dose.  Baseline labs reviewed.   Goal of Therapy:  Appropriate anticoagulation Monitor platelets by anticoagulation protocol: Yes    Plan:  - Eliquis 2.5mg  PO BID - Pharmacy will sign off and follow peripherally.  Thank you for the consult!    Arthi Mcdonald D. Laney Potash, PharmD, BCPS Pager:  [phone] 01/22/2016, 11:51 AM

## 2016-01-22 NOTE — Evaluation (Signed)
Speech Language Pathology Evaluation Patient Details Name: Sarah Stone MRN: [id number] DOB: 04-10-1918 Today's Date: 01/22/2016 Time: 4098-1191 SLP Time Calculation (min) (ACUTE ONLY): 23 min  Problem List:  Patient Active Problem List   Diagnosis Date Noted  . Ischemic stroke of frontal lobe (HCC)   . CVA (cerebral infarction) 01/19/2016  . Benign essential HTN 01/19/2016  . Chronic atrial fibrillation (HCC) 01/19/2016  . Difficulty speaking   . Lactic acidosis    Past Medical History: History reviewed. No pertinent past medical history. Past Surgical History:  Past Surgical History  Procedure Laterality Date  . Inguinal hernia repair Left 02/24/2015    Procedure: HERNIA REPAIR INGUINAL INCARCERATED;  Surgeon: Glenna Fellows, MD;  Location: [facility];  Service: General;  Laterality: Left;  With MESH   HPI:  Sarah Stone is an 82 y.o. female with a past medical history significant for atrial fibrillation (was on apixaban in the past). As per chart reviewed, patient is very functional and still capable of living alone, family talked to her on the phone last night and noted that she was having difficulty naming certain things. This worsened throughout the morning and patient was taken to [facility].  MRI demonstrated a moderate-size mid left frontal lobe infarct involving left frontaloperculum region with extension to left caudate body. Tiny amount of hemorrhage associated with infarct of the left caudate body. Pt had difficulty maintaining LOA during SLE; nursing informed SLP she received Seroquel last night which may impact overall functioning re: SLE components; partial assessment completed this date.   Assessment / Plan / Recommendation Clinical Impression   Limited SLE completed this date; pt had difficulty maintaining LOA, but vocal quality hoarse and low vocal intensity affecting overall intelligibility in sentences-simple conversational tasks (50-75% accurate), auditory comprehension  tasks decreased as yes/no questions <50% accurate, orientation only to name, but disoriented to situation, place, time; perseveration noted with naming tasks including responsive, divergent and convergent, but improved with carrier phrases and phonemic cues provided; pt exhibited decreased organizational/sequencing skills with simple tasks and decreased attention during functional tasks; monitor vocal quality as nursing stated concern with pills this am and this has not been an issue during current hospitalization as pt passed NSS on 01/19/16; ST will continue on-going assessment with cognition, which may have been impacted with medications given on 01/21/16 decreasing overall LOA.     SLP Assessment  Patient needs continued Speech Language Pathology Services    Follow Up Recommendations  Skilled Nursing facility;Other (comment) ([facility] may be considered; consult family)    Frequency and Duration min 2x/week  1 week      SLP Evaluation Prior Functioning  Cognitive/Linguistic Baseline: Information not available Type of Home: House Available Help at Discharge: Family;Available PRN/intermittently   Cognition  Overall Cognitive Status: Difficult to assess Arousal/Alertness: Suspect due to medications Orientation Level: Oriented to person;Disoriented to place;Disoriented to time;Disoriented to situation Awareness: Impaired Behaviors: Perseveration Safety/Judgment: Impaired    Comprehension  Auditory Comprehension Overall Auditory Comprehension: Impaired Yes/No Questions: Impaired Basic Immediate Environment Questions: 25-49% accurate Other Yes/No Questions Comments`: Moderate Commands: Impaired Multistep Basic Commands: 25-49% accurate Conversation: Simple Interfering Components: Attention;Hearing Visual Recognition/Discrimination Discrimination: Not tested Reading Comprehension Reading Status: Unable to assess (comment) (lethargy)    Expression Expression Primary Mode of  Expression: Verbal Verbal Expression Overall Verbal Expression: Impaired Level of Generative/Spontaneous Verbalization: Sentence Repetition: Impaired Level of Impairment: Phrase level Naming: Impairment Responsive: 26-50% accurate Confrontation: Impaired Convergent: 25-49% accurate Divergent: 25-49% accurate Verbal Errors: Perseveration;Not aware of errors  Pragmatics: Unable to assess Non-Verbal Means of Communication: Not applicable Written Expression Written Expression: Unable to assess (comment) (lethargy)   Oral / Motor  Oral Motor/Sensory Function Overall Oral Motor/Sensory Function: Other (comment) (appears WFL; limited assessment) Motor Speech Overall Motor Speech: Appears within functional limits for tasks assessed Respiration: Within functional limits Phonation: Hoarse;Low vocal intensity Resonance: Within functional limits Articulation: Impaired Level of Impairment: Sentence Intelligibility: Intelligibility reduced Word: 50-74% accurate Phrase: 50-74% accurate Sentence: 50-74% accurate Conversation: 50-74% accurate Motor Planning: Witnin functional limits Motor Speech Errors: Not applicable Interfering Components: Hearing loss Effective Techniques: Increased vocal intensity                       Caria Transue,PAT, M.S., CCC-SLP 01/22/2016, 11:30 AM

## 2016-01-22 NOTE — Progress Notes (Signed)
STROKE TEAM PROGRESS NOTE   HISTORY OF PRESENT ILLNESS Sarah Stone is an 82 y.o. female with a past medical history significant for atrial fibrillation (was on apixaban in the past), transfer to [facility] for further evaluation and management of stroke. Patient is alert and awake but can not tell me why she is in the hospital. As per chart reviewed, patient is very functional and still capable of living alone, family talked to her on the phone last night and noted that she was having difficulty naming certain things. This worsened throughout the morning and patient was taken to [facility] for evaluation. Work up at [facility] included a brain MRI that I reviewed myself and demonstrated a moderate-size mid left frontal lobe infarct involving left frontaloperculum region with extension to left caudate body. Tiny amount of hemorrhage associated with infarct of the left caudate body. Presently, she denies HA, vertigo, double vision, difficulty swallowing, focal weakness, or vision impairment. Available serologies reviewed and significant only for Na 132.  Date last known well: uncertain Time last known well: uncertain tPA Given: no, late presentation   SUBJECTIVE (INTERVAL HISTORY) Daughter is not at bedside. Speech is improving  OBJECTIVE Temp:  [97.7 F (36.5 C)-99.3 F (37.4 C)] 98.2 F (36.8 C) (01/24 0956) Pulse Rate:  [60-83] 83 (01/24 0956) Cardiac Rhythm:  [-] Atrial fibrillation (01/24 0758) Resp:  [17-20] 17 (01/24 0956) BP: (144-163)/(65-93) 159/93 mmHg (01/24 0956) SpO2:  [93 %-96 %] 96 % (01/24 0956) Weight:  [132 lb 7.9 oz (60.1 kg)] 132 lb 7.9 oz (60.1 kg) (01/24 1100)  CBC:   Recent Labs Lab 01/19/16 1011 01/19/16 1013  WBC 8.3  --   NEUTROABS 5.2  --   HGB 11.9* 12.9  HCT 34.2* 38.0  MCV 98.6  --   PLT 299  --     Basic Metabolic Panel:   Recent Labs Lab 01/19/16 1011 01/19/16 1013  NA 132* 134*  K 4.0 3.8  CL 96* 94*  CO2 27  --   GLUCOSE 117* 116*  BUN 14 15   CREATININE 0.53 0.60  CALCIUM 8.7*  --     Lipid Panel:     Component Value Date/Time   CHOL 138 01/20/2016 0710   TRIG 44 01/20/2016 0710   HDL 38* 01/20/2016 0710   CHOLHDL 3.6 01/20/2016 0710   VLDL 9 01/20/2016 0710   LDLCALC 91 01/20/2016 0710   HgbA1c:  Lab Results  Component Value Date   HGBA1C 6.2* 01/20/2016   Urine Drug Screen: No results found for: LABOPIA, COCAINSCRNUR, LABBENZ, AMPHETMU, THCU, LABBARB    IMAGING  Dg Chest 2 View 01/19/2016   Enlarged cardiac silhouette without evidence of pulmonary edema or focal airspace consolidation.     Ct Head Wo Contrast 01/19/2016   1. Atrophy and chronic ischemic changes in the white matter.  2. Additional ill-defined low-density area within the left frontal lobe is of uncertain age, favored to be additional chronic ischemic change. Could confirm with brain MRI if clinically needed.  3. No intracranial mass or hemorrhage.     Mr Lodema Pilot Contrast 01/19/2016   Exam is motion degraded. Moderate-size mid left frontal lobe infarct involving left frontal operculum region with extension to left caudate body. Tiny amount of hemorrhage associated with infarct of the left caudate body with minimal deformity of the adjacent left lateral ventricle. Remote basal ganglia infarcts. Moderate small vessel disease changes. No intracranial mass or abnormal enhancement. Moderate global atrophy without hydrocephalus. Ectatic vertebral arteries  with narrowing of left vertebral artery suspected. Ectatic basilar artery. Internal carotid arteries are patent bilaterally.     PHYSICAL EXAM  PHYSICAL EXAM Physical exam: Exam: Gen: NAD Eyes: anicteric sclerae, moist conjunctivae                    CV: no MRG, no carotid bruits, no peripheral edema Mental Status: Alert, follows commands, good historian  Neuro: Detailed Neurologic Exam  Speech:    Patient with mild  expressive aphasia. Nonfluent speech Difficulty naming objects.  Perseveration. Can repeat. Follows simple commands. Can read "close your eyes" but doesn't perform the task. She denies any problems with speaking.   Cranial Nerves:    The pupils are equal, round, and reactive to light.. Attempted, Fundi not visualized.  EOMI. No gaze preference. Blinks to threat bilaterally.  Face symmetric, Tongue midline. Hearing intact to voice. Shoulder shrug intact  Motor Observation:    no involuntary movements noted. Tone appears normal.     Strength:    Difficult to fully examine due to cognitive deficits and sundowning, strength appears equal and intact in all extremities.      Sensation: appears intact, withdraws to pain x4  Plantars downgoing.   Coordination: No dysmetria  Gait: slight wide based, ambulating around the room independently  ASSESSMENT/PLAN [name] is a 82 y.o. female with history of hypertension, chronic atrial fibrillation, and previous strokes by MRI presenting with speech difficulties. She did not receive IV t-PA due to late presentation.   Stroke:  Non-Dominant infarct felt to be embolic secondary to atrial fibrillation.  Resultant  Expressive aphasia  MRI - Moderate-size mid left frontal lobe infarct. Tiny amount of hemorrhage. Remote basal ganglia infarcts. MRA - Moderate middle cerebral artery branch vessel narrowing bilaterally with decrease number of visualized left middle cerebral artery  branches consistent with patient's acute infarct Carotid Doppler - Bilateral: mild mixed plaque distal CCA. Moderate mixed plaque origin and proximal ICA and ECA. 1-39% ICA plaquing. Vertebral  artery flow is antegrade.  2D Echo - Left ventricle: The cavity size was normal. Systolic function was normal. The estimated ejection fraction was in the range of 60% to 65%. Wall motion was normal; there were no regional wall motion abnormalities.  LDL - 91  HgbA1c 6.2  VTE prophylaxis -  SCDs Diet Heart Room service  appropriate?: Yes; Fluid consistency:: Thin  aspirin 81 mg daily prior to admission, now on aspirin 325 mg daily. She was place on Eliquis inpatient in 02/2015 by cardiology for chronic afib and stroke prevention but per daughter her pcp discontinued almost immediately. Discussed restarting, she will discuss with Dr. Pearlean Brownie in the morning.   Patient counseled to be compliant with her antithrombotic medications  Ongoing aggressive stroke risk factor management  Therapy recommendations: PT recommends skilled nursing facility Disposition:  SNF Hypertension  Stable  Permissive hypertension (OK if < 220/120) but gradually normalize in 5-7 days  Hyperlipidemia  Home meds: No lipid lowering medications prior to admission  LDL 91, goal < 70  Add Lipitor  Continue statin at discharge    Other Stroke Risk Factors  Advanced age  Obesity, Body mass index is 24.38 kg/(m^2).   Hx stroke/TIA  Afib not anticoagulated   Other Active Problems  Sodium 134  Hospital day # 3     I personally examined patient and images, and have participated in and made any corrections needed to history, physical, neuro exam,assessment and plan as stated above.  I  have personally obtained the history, evaluated lab date, reviewed imaging studies and agree with radiology interpretations.   Patient has mild expressive aphasia. She has chronic afib not on eliquis. She was place on Eliquis inpatient in 02/2015 by cardiology for chronic afib and stroke prevention but per daughter her pcp discontinued almost immediately. Discussed restarting, I spoke to Dr. Nila Nephew patient's primary physician who is now in agreement with starting eliquis transfer to skilled nursing facility was the next few days. Stroke team will sign off. Kindly call for questions.  Delia Heady, MD Stroke Neurology [phone] [facility]     To contact Stroke Continuity provider, please refer to [email or web address]. After  hours, contact General Neurology

## 2016-01-23 MED ORDER — APIXABAN 2.5 MG PO TABS: 2.5000 mg | ORAL_TABLET | Freq: Two times a day (BID) | ORAL | Status: AC

## 2016-01-23 MED ORDER — ATORVASTATIN CALCIUM 20 MG PO TABS: 20.0000 mg | ORAL_TABLET | Freq: Every day | ORAL | Status: DC

## 2016-01-23 NOTE — Clinical Social Work Note (Signed)
Awaiting completion of admissions paperwork.   Clinical Social Worker facilitated patient discharge including contacting patient family and facility to confirm patient discharge plans.  Clinical information faxed to facility and family agreeable with plan.  CSW arranged ambulance transport via [facility] to [facility] .  RN to call report prior to discharge.  Clinical Social Worker will sign off for now as social work intervention is no longer needed. Please consult [place] again if new need arises.  Derenda Fennel, MSW, LCSWA [phone] 01/23/2016 11:49 AM

## 2016-01-23 NOTE — Discharge Summary (Signed)
PATIENT DETAILS Name: Sarah Stone Age: 82 y.o. Sex: female Date of Birth: 1918-09-27 MRN: [id number]. Admitting Physician: Alison Murray, MD JYN:WGNFA, Lorenda Ishihara, MD  Admit Date: 01/19/2016 Discharge date: 01/23/2016  Recommendations for Outpatient Follow-up:  1. New Medication: Eliquis 2. Please repeat CBC/BMET 1 week 3. Ensure follow up with speech therapy while at SNF 4. Ensure follow up with the neurology 5. Repeat lipid panel in 3 months  PRIMARY DISCHARGE DIAGNOSIS:  Principal Problem:   CVA (cerebral infarction) Active Problems:   Benign essential HTN   Chronic atrial fibrillation (HCC)   Difficulty speaking   Lactic acidosis   Ischemic stroke of frontal lobe (HCC)      PAST MEDICAL HISTORY: Hypertension Atrial fibrillation   DISCHARGE MEDICATIONS: Current Discharge Medication List    START taking these medications   Details  apixaban (ELIQUIS) 2.5 MG TABS tablet Take 1 tablet (2.5 mg total) by mouth 2 (two) times daily.    atorvastatin (LIPITOR) 20 MG tablet Take 1 tablet (20 mg total) by mouth daily at 6 PM.      CONTINUE these medications which have NOT CHANGED   Details  atenolol (TENORMIN) 25 MG tablet Take 25 mg by mouth daily.    hydrochlorothiazide (HYDRODIURIL) 25 MG tablet Take 25 mg by mouth daily. Refills: 0    latanoprost (XALATAN) 0.005 % ophthalmic solution Place 1 drop into both eyes daily.  Refills: 0    timolol (BETIMOL) 0.5 % ophthalmic solution Place 1 drop into the right eye daily.    potassium chloride (K-DUR) 10 MEQ tablet Take 1 tablet (10 mEq total) by mouth daily. Qty: 30 tablet, Refills: 0      STOP taking these medications     aspirin EC 81 MG tablet      metoprolol tartrate (LOPRESSOR) 25 MG tablet         ALLERGIES:  No Known Allergies  BRIEF HPI:  See H&P, Labs, Consult and Test reports for all details in brief, patient is a 82 year old female with past medical history of atrial fibrillation who  presented with difficulty naming certain things, further workup in the hospital including an MRI brain showed a moderate-sized mid left frontal infarct.  CONSULTATIONS:   neurology  PERTINENT RADIOLOGIC STUDIES: Dg Chest 2 View  01/19/2016  CLINICAL DATA:  Altered mental status and confusion since last night. EXAM: CHEST  2 VIEW COMPARISON:  Abdominal series radiograph dated 02/23/2015 FINDINGS: The cardiac silhouette is enlarged. Mediastinal contours appear intact. There is no evidence of focal airspace consolidation, pleural effusion or pneumothorax. Bilateral high density nodular opacities within the mid lung fields likely represent granulomas. Osseous structures are without acute abnormality. Soft tissues are grossly normal. IMPRESSION: Enlarged cardiac silhouette without evidence of pulmonary edema or focal airspace consolidation. Electronically Signed   By: Ted Mcalpine M.D.   On: 01/19/2016 10:44   Ct Head Wo Contrast  01/19/2016  CLINICAL DATA:  Altered mental status EXAM: CT HEAD WITHOUT CONTRAST TECHNIQUE: Contiguous axial images were obtained from the base of the skull through the vertex without intravenous contrast. COMPARISON:  None. FINDINGS: There is mild generalized brain atrophy with commensurate dilatation of the ventricles and sulci. Chronic small vessel ischemic changes noted within the bilateral periventricular and subcortical white matter regions. Ill-defined low-density focus within the left frontal lobe is of uncertain age, more likely additional chronic ischemic change. There is no parenchymal mass or hemorrhage identified. No extra-axial hemorrhage. No acute osseous abnormality. Paranasal sinuses are clear. Superficial  soft tissues are unremarkable. IMPRESSION: 1. Atrophy and chronic ischemic changes in the white matter. 2. Additional ill-defined low-density area within the left frontal lobe is of uncertain age, favored to be additional chronic ischemic change. Could  confirm with brain MRI if clinically needed. 3. No intracranial mass or hemorrhage. Electronically Signed   By: Bary Richard M.D.   On: 01/19/2016 10:51   Mr Shirlee Latch Wo Contrast  01/20/2016  CLINICAL DATA:  82 year old hypertensive female with difficulty speaking and acute left frontal lobe infarct. Subsequent encounter. EXAM: MRA HEAD WITHOUT CONTRAST TECHNIQUE: Angiographic images of the Circle of Willis were obtained using MRA technique without intravenous contrast. COMPARISON:  01/19/2016 head CT and brain MR. FINDINGS: Moderate to marked focal stenosis mid aspect left middle cerebral artery M1 segment. Mild to moderate stenosis of proximal M1 segment right middle cerebral artery. Moderate middle cerebral artery branch vessel narrowing bilaterally with decrease number of visualized left middle cerebral artery branches consistent with patient's acute infarct. Ectatic vertebral arteries with dominant right vertebral artery. Mild narrowing distal vertebral arteries without high-grade stenosis. Full extent of the left posterior inferior cerebellar artery not imaged. Narrowing of the distal aspect of the right posterior inferior cerebellar artery. Ectatic basilar artery with mild narrowing distal aspect. Moderate tandem stenosis superior cerebellar artery bilaterally. Marked narrowing and irregularity of the posterior cerebral arteries bilaterally with almost complete occlusion of a majority of the right posterior cerebral artery and long segment of the mid portion of the left posterior cerebral artery. Narrowing/ occluded distal left posterior cerebral artery branches. No aneurysm noted. IMPRESSION: Moderate to marked focal stenosis mid aspect left middle cerebral artery M1 segment. Mild to moderate stenosis of proximal M1 segment right middle cerebral artery. Moderate middle cerebral artery branch vessel narrowing bilaterally with decrease number of visualized left middle cerebral artery branches  consistent with patient's acute infarct. Ectatic vertebral arteries with dominant right vertebral artery. Mild narrowing distal vertebral arteries without high-grade stenosis. Full extent of the left posterior inferior cerebellar artery not imaged. Narrowing of the distal aspect of the right posterior inferior cerebellar artery. Ectatic basilar artery with mild narrowing distal aspect. Moderate tandem stenosis superior cerebellar artery bilaterally. Marked narrowing and irregularity of the posterior cerebral arteries bilaterally with almost complete occlusion of a majority of the right posterior cerebral artery and long segment of the mid portion of the left posterior cerebral artery. Narrowing/ occluded distal left posterior cerebral artery branches. Electronically Signed   By: Lacy Duverney M.D.   On: 01/20/2016 16:13   Mr Laqueta Jean ZO Contrast  01/19/2016  CLINICAL DATA:  82 year old hypertensive female with sudden change in mental status yesterday with difficulty expressing self. Subsequent encounter. EXAM: MRI HEAD WITHOUT AND WITH CONTRAST TECHNIQUE: Multiplanar, multiecho pulse sequences of the brain and surrounding structures were obtained without and with intravenous contrast. CONTRAST:  10mL MULTIHANCE GADOBENATE DIMEGLUMINE 529 MG/ML IV SOLN COMPARISON:  01/19/2016 CT.  No comparison MR. FINDINGS: Exam is motion degraded. Moderate-size mid left frontal lobe infarct involving left frontal operculum region with extension to left caudate body. Tiny amount of hemorrhage associated with infarct of the left caudate body with minimal deformity of the adjacent left lateral ventricle. Remote basal ganglia infarcts. Moderate small vessel disease changes. No intracranial mass or abnormal enhancement. Moderate global atrophy without hydrocephalus. Ectatic vertebral arteries with narrowing of left vertebral artery suspected. Ectatic basilar artery. Internal carotid arteries are patent bilaterally. Post lens  replacement otherwise orbital structures unremarkable. Opacification post left  ethmoid sinus air cells and partial opacification left mastoid air cells. Mild transverse ligament hypertrophy and mild narrowing upper cervical spine. Cervical medullary junction, pituitary region pineal region unremarkable. IMPRESSION: Exam is motion degraded. Moderate-size mid left frontal lobe infarct involving left frontal operculum region with extension to left caudate body. Tiny amount of hemorrhage associated with infarct of the left caudate body with minimal deformity of the adjacent left lateral ventricle. Remote basal ganglia infarcts. Moderate small vessel disease changes. No intracranial mass or abnormal enhancement. Moderate global atrophy without hydrocephalus. Ectatic vertebral arteries with narrowing of left vertebral artery suspected. Ectatic basilar artery. Internal carotid arteries are patent bilaterally. Electronically Signed   By: Lacy Duverney M.D.   On: 01/19/2016 12:53     PERTINENT LAB RESULTS: CBC: No results for input(s): WBC, HGB, HCT, PLT in the last 72 hours. CMET CMP     Component Value Date/Time   NA 134* 01/19/2016 1013   K 3.8 01/19/2016 1013   CL 94* 01/19/2016 1013   CO2 27 01/19/2016 1011   GLUCOSE 116* 01/19/2016 1013   BUN 15 01/19/2016 1013   CREATININE 0.60 01/19/2016 1013   CALCIUM 8.7* 01/19/2016 1011   PROT 7.1 01/19/2016 1011   ALBUMIN 3.8 01/19/2016 1011   AST 38 01/19/2016 1011   ALT 18 01/19/2016 1011   ALKPHOS 67 01/19/2016 1011   BILITOT 1.2 01/19/2016 1011   GFRNONAA >60 01/19/2016 1011   GFRAA >60 01/19/2016 1011    GFR Estimated Creatinine Clearance: 35.8 mL/min (by C-G formula based on Cr of 0.6). No results for input(s): LIPASE, AMYLASE in the last 72 hours. No results for input(s): CKTOTAL, CKMB, CKMBINDEX, TROPONINI in the last 72 hours. Invalid input(s): POCBNP No results for input(s): DDIMER in the last 72 hours. No results for input(s): HGBA1C  in the last 72 hours. No results for input(s): CHOL, HDL, LDLCALC, TRIG, CHOLHDL, LDLDIRECT in the last 72 hours. No results for input(s): TSH, T4TOTAL, T3FREE, THYROIDAB in the last 72 hours.  Invalid input(s): FREET3 No results for input(s): VITAMINB12, FOLATE, FERRITIN, TIBC, IRON, RETICCTPCT in the last 72 hours. Coags: No results for input(s): INR in the last 72 hours.  Invalid input(s): PT Microbiology: Recent Results (from the past 240 hour(s))  Urine culture     Status: None   Collection Time: 01/19/16 11:19 AM  Result Value Ref Range Status   Specimen Description URINE, CLEAN CATCH  Final   Special Requests NONE  Final   Culture   Final    MULTIPLE SPECIES PRESENT, SUGGEST RECOLLECTION Performed at [facility]    Report Status 01/20/2016 FINAL  Final     BRIEF HOSPITAL COURSE:   Principal Problem: Acute CVA: Presented with difficulty speaking. Neurology was consulted, patient underwent workup. MRI showed a moderate sized left frontal infarct.MRA showed moderate to marked focal stenosis mid aspect left middle cerebral artery, mild to moderate stenosis of proximal M1 segment, right middle cerebral artery. Echo showed EF around 60-65% without any embolic source. Carotid Dopplers did not show any significant stenosis. LDL was 91, A1c 6.2. Underwent physical therapy evaluation-recommendations were for SNF. Was also seen by speech therapy-recommendations are to start dysphagia 3 diet- Patient aware of aspiration risk. Neurology, spoke with patient's primary care practitioner, on also with the patient-recommendations are to restart Eliquis. Nonfocal exam, speech much better at the time of discharge-only minimal expressive aphasia at time of discharge.  Active Problems: Benign essential HTN: Continue atenolol and HCTZ. Further optimization deferred to  the outpatient setting  Chronic atrial fibrillation: See above. Rate controlled with atenolol,CHADS vasc score 5  Mild  hyponatremia: Suspect secondary to HCTZ-since very mild-suspect we could continue HCTZ cautiously. Please continue to check electrolytes periodically in the outpatient setting.  Mild delirium: Suspect sundowning-suspect mild dementia/cognitive dysfunction. Supportive care.  TODAY-DAY OF DISCHARGE:  Subjective:   [name] today has no headache,no chest abdominal pain,no new weakness tingling or numbness, feels much better   Objective:   Blood pressure 158/90, pulse 76, temperature 98.4 F (36.9 C), temperature source Oral, resp. rate 16, height 5' 1.81" (1.57 m), weight 60.1 kg (132 lb 7.9 oz), SpO2 96 %.  Intake/Output Summary (Last 24 hours) at 01/23/16 1047 Last data filed at 01/22/16 1838  Gross per 24 hour  Intake      0 ml  Output      0 ml  Net      0 ml   Filed Weights   01/22/16 1100  Weight: 60.1 kg (132 lb 7.9 oz)    Exam Awake Alert, Oriented *3, No new F.N deficits, Normal affect .AT,PERRAL Supple Neck,No JVD, No cervical lymphadenopathy appriciated.  Symmetrical Chest wall movement, Good air movement bilaterally, CTAB RRR,No Gallops,Rubs or new Murmurs, No Parasternal Heave +ve B.Sounds, Abd Soft, Non tender, No organomegaly appriciated, No rebound -guarding or rigidity. No Cyanosis, Clubbing or edema, No new Rash or bruise  DISCHARGE CONDITION: Stable  DISPOSITION: SNF  DISCHARGE INSTRUCTIONS:    Activity:  As tolerated with Full fall precautions use walker/cane & assistance as needed  Get Medicines reviewed and adjusted: Please take all your medications with you for your next visit with your Primary MD  Please request your Primary MD to go over all hospital tests and procedure/radiological results at the follow up, please ask your Primary MD to get all Hospital records sent to his/her office.  If you experience worsening of your admission symptoms, develop shortness of breath, life threatening emergency, suicidal or homicidal thoughts you must  seek medical attention immediately by calling 911 or calling your MD immediately  if symptoms less severe.  You must read complete instructions/literature along with all the possible adverse reactions/side effects for all the Medicines you take and that have been prescribed to you. Take any new Medicines after you have completely understood and accpet all the possible adverse reactions/side effects.   Do not drive when taking Pain medications.   Do not take more than prescribed Pain, Sleep and Anxiety Medications  Special Instructions: If you have smoked or chewed Tobacco  in the last 2 yrs please stop smoking, stop any regular Alcohol  and or any Recreational drug use.  Wear Seat belts while driving.  Please note  You were cared for by a hospitalist during your hospital stay. Once you are discharged, your primary care physician will handle any further medical issues. Please note that NO REFILLS for any discharge medications will be authorized once you are discharged, as it is imperative that you return to your primary care physician (or establish a relationship with a primary care physician if you do not have one) for your aftercare needs so that they can reassess your need for medications and monitor your lab values.   Diet recommendation: Heart Healthy diet/Dys 3 diet (see below)  Discharge Instructions    Ambulatory referral to Neurology    Complete by:  As directed      Diet - low sodium heart healthy    Complete by:  As directed  Thin liquid;Dysphagia 3 (Mech soft)  Liquid Administration via: Cup;Straw Medication Administration: (1@a  time) Supervision: Staff to assist with self feeding;Full supervision/cueing for compensatory strategies Compensations: Minimize environmental distractions;Slow rate;Small sips/bites Postural Changes: Seated upright at 90 degrees      Increase activity slowly    Complete by:  As directed            Follow-up Information    Follow up with  GREEN, Lorenda Ishihara, MD. Schedule an appointment as soon as possible for a visit in 2 weeks.   Specialty:  Internal Medicine   Contact information:   [address] [place] [address] [phone]       Follow up with SETHI,PRAMOD, MD.   Specialties:  Neurology, Radiology   Why:  office will call for a follow up appointment-but please make sure you follow up in 2 months   Contact information:   [address] [place] [address] [phone]      Total Time spent on discharge equals 45 minutes.  SignedJeoffrey Massed 01/23/2016 10:47 AM

## 2016-01-23 NOTE — Progress Notes (Signed)
Pt discharged from hospital per orders from MD. Pt informed of discharge before transport arrived. Pt exited hospital via patient transport. All questions and concerns were addressed. Pt's IV removed before discharge.

## 2016-01-23 NOTE — Evaluation (Signed)
Clinical/Bedside Swallow Evaluation Patient Details  Name: Sarah Stone MRN: [id number] Date of Birth: 01-17-1918  Today's Date: 01/23/2016 Time: SLP Start Time (ACUTE ONLY): 0930 SLP Stop Time (ACUTE ONLY): 0959 SLP Time Calculation (min) (ACUTE ONLY): 29 min  Past Medical History: History reviewed. No pertinent past medical history. Past Surgical History:  Past Surgical History  Procedure Laterality Date  . Inguinal hernia repair Left 02/24/2015    Procedure: HERNIA REPAIR INGUINAL INCARCERATED;  Surgeon: Glenna Fellows, MD;  Location: [facility];  Service: General;  Laterality: Left;  With MESH   HPI:  Sarah Stone is an 82 y.o. female with a past medical history significant for atrial fibrillation (was on apixaban in the past). As per chart reviewed, patient is very functional and still capable of living alone, family talked to her on the phone last night and noted that she was having difficulty naming certain things. This worsened throughout the morning and patient was taken to [facility].  MRI demonstrated a moderate-size mid left frontal lobe infarct involving left frontaloperculum region with extension to left caudate body. Tiny amount of hemorrhage associated with infarct of the left caudate body.   Assessment / Plan / Recommendation Clinical Impression  Pt presents with adequate oral motor strength and function. No overt s/s aspiration or oral manipulation observed with any consistency tested, even large bite of candy bar with peanuts. Minimal intake at breakfast this morning. Recommend 1:1 assistance with feeding at this time, due to confusion. Will change diet to soft with chopped meats for energy conservation.    Aspiration Risk  Mild aspiration risk    Diet Recommendation Thin liquid;Dysphagia 3 (Mech soft)   Liquid Administration via: Cup;Straw Medication Administration:  (1@a  time) Supervision: Staff to assist with self feeding;Full supervision/cueing for compensatory  strategies Compensations: Minimize environmental distractions;Slow rate;Small sips/bites Postural Changes: Seated upright at 90 degrees    Other  Recommendations Oral Care Recommendations: Oral care QID;Staff/trained caregiver to provide oral care   Follow up Recommendations  24 hour supervision/assistance    Frequency and Duration min 2x/week  2 weeks       Prognosis Prognosis for Safe Diet Advancement: Good      Swallow Study   General Date of Onset: 01/19/16 HPI: Sarah Stone is an 82 y.o. female with a past medical history significant for atrial fibrillation (was on apixaban in the past). As per chart reviewed, patient is very functional and still capable of living alone, family talked to her on the phone last night and noted that she was having difficulty naming certain things. This worsened throughout the morning and patient was taken to [facility].  MRI demonstrated a moderate-size mid left frontal lobe infarct involving left frontaloperculum region with extension to left caudate body. Tiny amount of hemorrhage associated with infarct of the left caudate body. Type of Study: Bedside Swallow Evaluation Previous Swallow Assessment: pt passed RN stroke swallow screen Diet Prior to this Study: Regular;Thin liquids Temperature Spikes Noted: No Respiratory Status: Room air History of Recent Intubation: No Behavior/Cognition: Alert;Cooperative;Pleasant mood Oral Cavity Assessment: Dry;Dried secretions Oral Care Completed by SLP: Yes Oral Cavity - Dentition: Adequate natural dentition Vision: Functional for self-feeding Self-Feeding Abilities: Able to feed self;Needs set up Patient Positioning: Upright in bed Baseline Vocal Quality: Low vocal intensity Volitional Cough: Weak Volitional Swallow: Able to elicit    Oral/Motor/Sensory Function Overall Oral Motor/Sensory Function: Within functional limits   Ice Chips Ice chips: Within functional limits Presentation: Spoon   Thin Liquid  Thin Liquid: Within  functional limits Presentation: Straw;Self Fed;Cup    Nectar Thick Nectar Thick Liquid: Not tested   Honey Thick Honey Thick Liquid: Not tested   Puree Puree: Within functional limits Presentation: Spoon   Solid   GO   Solid: Within functional limits Presentation: Self Fed        Sarah Stone 01/23/2016,10:00 AM   Sarah Stone. [place], [facility], CCC-SLP [phone]

## 2016-01-23 NOTE — Clinical Social Work Placement (Signed)
   CLINICAL SOCIAL WORK PLACEMENT  NOTE  Date:  01/23/2016  Patient Details  Name: Sarah Stone MRN: [id number] Date of Birth: 08-19-1918  Clinical Social Work is seeking post-discharge placement for this patient at the Skilled  Nursing Facility level of care (*CSW will initial, date and re-position this form in  chart as items are completed):  Yes   Patient/family provided with [facility] Clinical Social Work Department's list of facilities offering this level of care within the geographic area requested by the patient (or if unable, by the patient's family).  Yes   Patient/family informed of their freedom to choose among providers that offer the needed level of care, that participate in Medicare, Medicaid or managed care program needed by the patient, have an available bed and are willing to accept the patient.  Yes   Patient/family informed of [facility]'s ownership interest in [facility] and [facility], as well as of the fact that they are under no obligation to receive care at these facilities.  PASRR submitted to EDS on 01/21/16     PASRR number received on 01/21/16     Existing PASRR number confirmed on       FL2 transmitted to all facilities in geographic area requested by pt/family on 01/21/16     FL2 transmitted to all facilities within larger geographic area on       Patient informed that his/her managed care company has contracts with or will negotiate with certain facilities, including the following:        Yes   Patient/family informed of bed offers received.  Patient chooses bed at  [facility] )     Physician recommends and patient chooses bed at      Patient to be transferred to  [facility] ) on 01/23/16.  Patient to be transferred to facility by  [facility] )     Patient family notified on 01/23/16 of transfer.  Name of family member notified:   (Pt's dtr and son-in-law: Dois Davenport and Annette Stable )     PHYSICIAN Please  sign FL2, Please prepare priority discharge summary, including medications     Additional Comment:    _______________________________________________ Vaughan Browner, LCSW 01/23/2016, 11:48 AM

## 2016-01-24 ENCOUNTER — Non-Acute Institutional Stay (SKILLED_NURSING_FACILITY): Payer: Medicare Other | Admitting: Internal Medicine

## 2016-01-24 ENCOUNTER — Encounter: Payer: Self-pay | Admitting: Internal Medicine

## 2016-01-24 DIAGNOSIS — E876 Hypokalemia: Secondary | ICD-10-CM

## 2016-01-24 DIAGNOSIS — R5381 Other malaise: Secondary | ICD-10-CM | POA: Diagnosis not present

## 2016-01-24 DIAGNOSIS — I639 Cerebral infarction, unspecified: Secondary | ICD-10-CM | POA: Diagnosis not present

## 2016-01-24 DIAGNOSIS — R471 Dysarthria and anarthria: Secondary | ICD-10-CM

## 2016-01-24 DIAGNOSIS — I482 Chronic atrial fibrillation, unspecified: Secondary | ICD-10-CM

## 2016-01-24 DIAGNOSIS — E871 Hypo-osmolality and hyponatremia: Secondary | ICD-10-CM

## 2016-01-24 DIAGNOSIS — R2681 Unsteadiness on feet: Secondary | ICD-10-CM

## 2016-01-24 DIAGNOSIS — H409 Unspecified glaucoma: Secondary | ICD-10-CM

## 2016-01-24 DIAGNOSIS — I69391 Dysphagia following cerebral infarction: Secondary | ICD-10-CM | POA: Diagnosis not present

## 2016-01-24 DIAGNOSIS — I69319 Unspecified symptoms and signs involving cognitive functions following cerebral infarction: Secondary | ICD-10-CM

## 2016-01-24 DIAGNOSIS — IMO0002 Reserved for concepts with insufficient information to code with codable children: Secondary | ICD-10-CM

## 2016-01-24 DIAGNOSIS — I10 Essential (primary) hypertension: Secondary | ICD-10-CM | POA: Diagnosis not present

## 2016-01-24 NOTE — Progress Notes (Signed)
Patient ID: Sarah Stone, female   DOB: July 03, 1918, 82 y.o.   MRN: [id number]     [facility]  PCP: Enrique Sack, MD  Code Status: Full Code   No Known Allergies  Chief Complaint  Patient presents with  . New Admit To SNF    New Admission      HPI:  82 y.o. patient is here for short term rehabilitation post hospital admission from 11/28/16-1/25/17with acute CVA. MRI brain showed a moderate-sized mid left frontal infarct. She has PMH of afib, HTN and glaucoma. She was independent prior to this admission. She is seen in her room today with her daughter present. She denies any complaints. As per her nurse, she ate her breakfast this am and had a bowel movement this am. Patient has expressive aphasia and cognitive impairment that makes HPI and ROS limited.   Review of Systems:  Constitutional: Negative for fever, chills, diaphoresis.  HENT: Negative for headache, congestion, nasal discharge   Eyes: Negative for blurred vision, double vision and discharge.  Respiratory: Negative for cough, shortness of breath and wheezing.   Cardiovascular: Negative for chest pain, palpitations, leg swelling.  Gastrointestinal: Negative for heartburn, nausea, vomiting, abdominal pain. Genitourinary: Negative for dysuria. Musculoskeletal: Negative for back pain, falls in the facility. Skin: Negative for itching, rash.  Neurological: Negative for dizziness Psychiatric/Behavioral: Negative for depression    History reviewed. No pertinent past medical history. Past Surgical History  Procedure Laterality Date  . Inguinal hernia repair Left 02/24/2015    Procedure: HERNIA REPAIR INGUINAL INCARCERATED;  Surgeon: Glenna Fellows, MD;  Location: [facility];  Service: General;  Laterality: Left;  With MESH   Social History:   reports that she has never smoked. She has never used smokeless tobacco. She reports that she does not drink alcohol or use illicit drugs.  History reviewed. No  pertinent family history.  Medications:   Medication List       This list is accurate as of: 01/24/16 11:15 AM.  Always use your most recent med list.               apixaban 2.5 MG Tabs tablet  Commonly known as:  ELIQUIS  Take 1 tablet (2.5 mg total) by mouth 2 (two) times daily.     atenolol 25 MG tablet  Commonly known as:  TENORMIN  Take 25 mg by mouth daily.     atorvastatin 20 MG tablet  Commonly known as:  LIPITOR  Take 1 tablet (20 mg total) by mouth daily at 6 PM.     hydrochlorothiazide 25 MG tablet  Commonly known as:  HYDRODIURIL  Take 25 mg by mouth daily.     latanoprost 0.005 % ophthalmic solution  Commonly known as:  XALATAN  Place 1 drop into both eyes daily.     potassium chloride 10 MEQ tablet  Commonly known as:  K-DUR  Take 1 tablet (10 mEq total) by mouth daily.     timolol 0.5 % ophthalmic solution  Commonly known as:  BETIMOL  Place 1 drop into the right eye daily.         Physical Exam:  Filed Vitals:   01/24/16 1110  BP: 130/80  Pulse: 74  Temp: 97.1 F (36.2 C)  TempSrc: Oral  Resp: 18  SpO2: 95%    General- elderly female, well built, in no acute distress Head- normocephalic, atraumatic, has hearing aid and glasses Nose- no maxillary or frontal sinus tenderness, no nasal  discharge Throat- moist mucus membrane Eyes- PERRLA, EOMI, no pallor, no icterus, no discharge, normal conjunctiva, normal sclera Neck- no cervical lymphadenopathy Cardiovascular- normal s1,s2, no murmurs, no leg edema Respiratory- bilateral clear to auscultation, no wheeze, no rhonchi, no crackles, no use of accessory muscles Abdomen- bowel sounds present, soft, non tender Musculoskeletal- able to move all 4 extremities, generalized weakness slightly greater on right than left, on wheelchair Neurological- dysarthria and expressive aphasia +, alert and oriented to person only Skin- warm and dry, neurofibromatosis nodules present Psychiatry- normal mood  and affect    Labs reviewed: Basic Metabolic Panel:  Recent Labs  16/10/96 0500 02/27/15 0525 01/19/16 1011 01/19/16 1013  NA 131* 131* 132* 134*  K 3.2* 3.4* 4.0 3.8  CL 98 96 96* 94*  CO2 --   GLUCOSE 114* 108* 117* 116*  BUN CREATININE 0.44* 0.57 0.53 0.60  CALCIUM 7.8* 7.9* 8.7*  --   MG  --  1.3*  --   --    Liver Function Tests:  Recent Labs  02/23/15 2305 01/19/16 1011  AST 26 38  ALT 13 18  ALKPHOS 70 67  BILITOT 0.6 1.2  PROT 7.4 7.1  ALBUMIN 4.1 3.8    Recent Labs  02/23/15 2305  LIPASE 39    Recent Labs  01/19/16 1011  AMMONIA 27   CBC:  Recent Labs  02/23/15 2305  02/26/15 0500 02/27/15 0525 01/19/16 1011 01/19/16 1013  WBC 6.5  < > 9.8 7.7 8.3  --   NEUTROABS 4.9  --   --   --  5.2  --   HGB 12.1  < > 10.6* 10.5* 11.9* 12.9  HCT 35.8*  < > 31.4* 30.6* 34.2* 38.0  MCV 97.5  < > 94.9 95.0 98.6  --   PLT 269  < > 245 262 299  --   < > = values in this interval not displayed. Cardiac Enzymes: No results for input(s): CKTOTAL, CKMB, CKMBINDEX, TROPONINI in the last 8760 hours. BNP: Invalid input(s): POCBNP CBG:  Recent Labs  01/19/16 0954  GLUCAP 107*    Radiological Exams: Dg Chest 2 View  01/19/2016  CLINICAL DATA:  Altered mental status and confusion since last night. EXAM: CHEST  2 VIEW COMPARISON:  Abdominal series radiograph dated 02/23/2015 FINDINGS: The cardiac silhouette is enlarged. Mediastinal contours appear intact. There is no evidence of focal airspace consolidation, pleural effusion or pneumothorax. Bilateral high density nodular opacities within the mid lung fields likely represent granulomas. Osseous structures are without acute abnormality. Soft tissues are grossly normal. IMPRESSION: Enlarged cardiac silhouette without evidence of pulmonary edema or focal airspace consolidation. Electronically Signed   By: Ted Mcalpine M.D.   On: 01/19/2016 10:44   Ct Head Wo Contrast  01/19/2016   CLINICAL DATA:  Altered mental status EXAM: CT HEAD WITHOUT CONTRAST TECHNIQUE: Contiguous axial images were obtained from the base of the skull through the vertex without intravenous contrast. COMPARISON:  None. FINDINGS: There is mild generalized brain atrophy with commensurate dilatation of the ventricles and sulci. Chronic small vessel ischemic changes noted within the bilateral periventricular and subcortical white matter regions. Ill-defined low-density focus within the left frontal lobe is of uncertain age, more likely additional chronic ischemic change. There is no parenchymal mass or hemorrhage identified. No extra-axial hemorrhage. No acute osseous abnormality. Paranasal sinuses are clear. Superficial soft tissues are unremarkable. IMPRESSION: 1. Atrophy and chronic ischemic changes in the white matter.  2. Additional ill-defined low-density area within the left frontal lobe is of uncertain age, favored to be additional chronic ischemic change. Could confirm with brain MRI if clinically needed. 3. No intracranial mass or hemorrhage. Electronically Signed   By: Bary Richard M.D.   On: 01/19/2016 10:51   Mr Shirlee Latch Wo Contrast  01/20/2016  CLINICAL DATA:  82 year old hypertensive female with difficulty speaking and acute left frontal lobe infarct. Subsequent encounter. EXAM: MRA HEAD WITHOUT CONTRAST TECHNIQUE: Angiographic images of the Circle of Willis were obtained using MRA technique without intravenous contrast. COMPARISON:  01/19/2016 head CT and brain MR. FINDINGS: Moderate to marked focal stenosis mid aspect left middle cerebral artery M1 segment. Mild to moderate stenosis of proximal M1 segment right middle cerebral artery. Moderate middle cerebral artery branch vessel narrowing bilaterally with decrease number of visualized left middle cerebral artery branches consistent with patient's acute infarct. Ectatic vertebral arteries with dominant right vertebral artery. Mild narrowing distal  vertebral arteries without high-grade stenosis. Full extent of the left posterior inferior cerebellar artery not imaged. Narrowing of the distal aspect of the right posterior inferior cerebellar artery. Ectatic basilar artery with mild narrowing distal aspect. Moderate tandem stenosis superior cerebellar artery bilaterally. Marked narrowing and irregularity of the posterior cerebral arteries bilaterally with almost complete occlusion of a majority of the right posterior cerebral artery and long segment of the mid portion of the left posterior cerebral artery. Narrowing/ occluded distal left posterior cerebral artery branches. No aneurysm noted. IMPRESSION: Moderate to marked focal stenosis mid aspect left middle cerebral artery M1 segment. Mild to moderate stenosis of proximal M1 segment right middle cerebral artery. Moderate middle cerebral artery branch vessel narrowing bilaterally with decrease number of visualized left middle cerebral artery branches consistent with patient's acute infarct. Ectatic vertebral arteries with dominant right vertebral artery. Mild narrowing distal vertebral arteries without high-grade stenosis. Full extent of the left posterior inferior cerebellar artery not imaged. Narrowing of the distal aspect of the right posterior inferior cerebellar artery. Ectatic basilar artery with mild narrowing distal aspect. Moderate tandem stenosis superior cerebellar artery bilaterally. Marked narrowing and irregularity of the posterior cerebral arteries bilaterally with almost complete occlusion of a majority of the right posterior cerebral artery and long segment of the mid portion of the left posterior cerebral artery. Narrowing/ occluded distal left posterior cerebral artery branches. Electronically Signed   By: Lacy Duverney M.D.   On: 01/20/2016 16:13   Mr Laqueta Jean NF Contrast  01/19/2016  CLINICAL DATA:  82 year old hypertensive female with sudden change in mental status yesterday with  difficulty expressing self. Subsequent encounter. EXAM: MRI HEAD WITHOUT AND WITH CONTRAST TECHNIQUE: Multiplanar, multiecho pulse sequences of the brain and surrounding structures were obtained without and with intravenous contrast. CONTRAST:  10mL MULTIHANCE GADOBENATE DIMEGLUMINE 529 MG/ML IV SOLN COMPARISON:  01/19/2016 CT.  No comparison MR. FINDINGS: Exam is motion degraded. Moderate-size mid left frontal lobe infarct involving left frontal operculum region with extension to left caudate body. Tiny amount of hemorrhage associated with infarct of the left caudate body with minimal deformity of the adjacent left lateral ventricle. Remote basal ganglia infarcts. Moderate small vessel disease changes. No intracranial mass or abnormal enhancement. Moderate global atrophy without hydrocephalus. Ectatic vertebral arteries with narrowing of left vertebral artery suspected. Ectatic basilar artery. Internal carotid arteries are patent bilaterally. Post lens replacement otherwise orbital structures unremarkable. Opacification post left ethmoid sinus air cells and partial opacification left mastoid air cells. Mild transverse ligament hypertrophy and  mild narrowing upper cervical spine. Cervical medullary junction, pituitary region pineal region unremarkable. IMPRESSION: Exam is motion degraded. Moderate-size mid left frontal lobe infarct involving left frontal operculum region with extension to left caudate body. Tiny amount of hemorrhage associated with infarct of the left caudate body with minimal deformity of the adjacent left lateral ventricle. Remote basal ganglia infarcts. Moderate small vessel disease changes. No intracranial mass or abnormal enhancement. Moderate global atrophy without hydrocephalus. Ectatic vertebral arteries with narrowing of left vertebral artery suspected. Ectatic basilar artery. Internal carotid arteries are patent bilaterally. Electronically Signed   By: Lacy Duverney M.D.   On: 01/19/2016  12:53    Assessment/Plan  Physical deconditioning Will have her work with physical therapy and occupational therapy team to help with gait training and muscle strengthening exercises.fall precautions. Skin care. Encourage to be out of bed.   Unsteady gait Post CVA. Will have patient work with PT/OT as tolerated to regain strength and restore function.  Fall precautions are in place.  Acute CVA Continue lipitor 20 mg daily and BP medication along with eliquis  Cognitive deficit Post CVA. Will have her work with SLP team  Dysphagia Continue dysphagia 3 diet, aspiration precautions, assistance with feeding for now  Dysarthria alongwith expressive aphasia post CVA. Will have her work with SLP team  afib Rate currently controlled. Continue eliquis 2.5 mg bid and statin  HTN Stable, monitor BP. Continue atenolol 25 mg daily, hctz 25 mg daily  Glaucoma Continue latanoprost drop and timolol  Hypokalemia Continue kcl, check bmp  Hyponatremia Monitor bmp   Goals of care: short term rehabilitation   Labs/tests ordered: cbc with diff, cmp 1 week  Family/ staff Communication: reviewed care plan with patient and nursing supervisor    Oneal Grout, MD  [facility] [phone] (Monday-Friday 8 am - 5 pm) [phone] (afterhours)

## 2016-01-28 LAB — BASIC METABOLIC PANEL
BUN: 18 mg/dL (ref 4–21)
CREATININE: 0.6 mg/dL (ref 0.5–1.1)
Glucose: 221 mg/dL
POTASSIUM: 3.2 mmol/L — AB (ref 3.4–5.3)
SODIUM: 132 mmol/L — AB (ref 137–147)

## 2016-01-28 LAB — CBC AND DIFFERENTIAL
HCT: 33 % — AB (ref 36–46)
Hemoglobin: 11.2 g/dL — AB (ref 12.0–16.0)
PLATELETS: 383 10*3/uL (ref 150–399)
WBC: 9.8 10^3/mL

## 2016-01-28 LAB — HEPATIC FUNCTION PANEL
ALK PHOS: 128 U/L — AB (ref 25–125)
ALT: 100 U/L — AB (ref 7–35)
AST: 100 U/L — AB (ref 13–35)
Bilirubin, Total: 1 mg/dL

## 2016-01-30 ENCOUNTER — Non-Acute Institutional Stay (SKILLED_NURSING_FACILITY): Payer: Medicare Other | Admitting: Family

## 2016-01-30 DIAGNOSIS — R600 Localized edema: Secondary | ICD-10-CM | POA: Diagnosis not present

## 2016-01-30 LAB — CBC AND DIFFERENTIAL
HCT: 28 % — AB (ref 36–46)
HEMOGLOBIN: 9.9 g/dL — AB (ref 12.0–16.0)
Platelets: 389 10*3/uL (ref 150–399)
WBC: 8.1 10^3/mL

## 2016-01-30 LAB — BASIC METABOLIC PANEL
BUN: 17 mg/dL (ref 4–21)
CREATININE: 0.5 mg/dL (ref 0.5–1.1)
GLUCOSE: 87 mg/dL
POTASSIUM: 3.8 mmol/L (ref 3.4–5.3)
SODIUM: 131 mmol/L — AB (ref 137–147)

## 2016-01-30 NOTE — Progress Notes (Signed)
Patient ID: Sarah Stone, female   DOB: [birth year], 82 y.o.   MRN: [id number]  Location:  [facility]  Provider: Oneal Grout, MD   Chilton Si, Lorenda Ishihara, MD  Code Status: Full Code  Goals of care: Advanced Directive information Advanced Directives 01/19/2016  Does patient have an advance directive? No  Type of Advance Directive -  Does patient want to make changes to advanced directive? -  Copy of advanced directive(s) in chart? -  Would patient like information on creating an advanced directive? No - patient declined information     Chief Complaint  Patient presents with   Acute Visit    swelling on the legs.    HPI:  Pt is a 82 y.o. female seen today at [facility] to evaluate leg swelling. She has a past medical history of CVA, HTN , Afib and Glaucoma. She is seen in her room today.Facility staff reports increased swelling on the legs. Patient denies any pain, redness, shortness of breath or wheezing.   Review of Systems  Constitutional: Negative for fever, chills and weight loss.  HENT: Negative.   Eyes: Negative.   Respiratory: Negative for cough, sputum production, shortness of breath and wheezing.   Cardiovascular: Positive for leg swelling. Negative for chest pain, palpitations and orthopnea.  Gastrointestinal: Negative.   Genitourinary: Negative.   Musculoskeletal: Negative for falls.  Skin: Negative.   Neurological: Negative.   Endo/Heme/Allergies: Negative.   Psychiatric/Behavioral: Negative.     No past medical history on file. Past Surgical History  Procedure Laterality Date   Inguinal hernia repair Left 02/24/2015    Procedure: HERNIA REPAIR INGUINAL INCARCERATED;  Surgeon: Glenna Fellows, MD;  Location: [facility];  Service: General;  Laterality: Left;  With MESH    No Known Allergies    Medication List       This list is accurate as of: 01/30/16 11:52 AM.  Always use your most recent med list.               apixaban 2.5 MG Tabs tablet  Commonly known as:  ELIQUIS  Take 1 tablet (2.5 mg total) by mouth 2 (two) times daily.     atenolol 25 MG tablet  Commonly known as:  TENORMIN  Take 25 mg by mouth daily.     atorvastatin 20 MG tablet  Commonly known as:  LIPITOR  Take 1 tablet (20 mg total) by mouth daily at 6 PM.     hydrochlorothiazide 25 MG tablet  Commonly known as:  HYDRODIURIL  Take 25 mg by mouth daily.     latanoprost 0.005 % ophthalmic solution  Commonly known as:  XALATAN  Place 1 drop into both eyes daily.     potassium chloride 10 MEQ tablet  Commonly known as:  K-DUR  Take 1 tablet (10 mEq total) by mouth daily.     timolol 0.5 % ophthalmic solution  Commonly known as:  BETIMOL  Place 1 drop into the right eye daily.        Immunization History  Administered Date(s) Administered   Influenza-Unspecified 08/29/2014   PPD Test 01/24/2016   Pertinent  Health Maintenance Due  Topic Date Due   DEXA SCAN  04/29/1985   PNA vac Low Risk Adult (1 of 2 - PCV13) 04/29/1985   INFLUENZA VACCINE  07/30/2015   No flowsheet data found.  Filed Vitals:   01/30/16 1139  BP: 146/72  Pulse: 68  Temp: 97.7 F (36.5 C)  Resp: 18  Weight: 132 lb (59.875 kg)   Body mass index is 24.29 kg/(m^2). Physical Exam  Constitutional: She is oriented to person, place, and time. She appears well-developed and well-nourished.  In no acute distress reading her morning news paper.   Cardiovascular: Normal rate, regular rhythm, normal heart sounds and intact distal pulses.  Exam reveals no gallop and no friction rub.   No murmur heard. Pulmonary/Chest: Effort normal and breath sounds normal.  Abdominal: Soft. Bowel sounds are normal. She exhibits no distension. There is no tenderness.  Musculoskeletal: Normal range of motion. She exhibits no tenderness.  Bilateral Lower extremities +3 edma   Neurological: She is oriented to person, place, and time.  Skin: Skin is warm  and dry. No erythema.  Psychiatric: She has a normal mood and affect.    Labs reviewed:  Recent Labs  02/26/15 0500 02/27/15 0525 01/19/16 1011 01/19/16 1013  NA 131* 131* 132* 134*  K 3.2* 3.4* 4.0 3.8  CL 98 96 96* 94*  CO2 26 26 27   --   GLUCOSE 114* 108* 117* 116*  BUN 13 14 14 15   CREATININE 0.44* 0.57 0.53 0.60  CALCIUM 7.8* 7.9* 8.7*  --   MG  --  1.3*  --   --     Recent Labs  02/23/15 2305 01/19/16 1011  AST 26 38  ALT 13 18  ALKPHOS 70 67  BILITOT 0.6 1.2  PROT 7.4 7.1  ALBUMIN 4.1 3.8    Recent Labs  02/23/15 2305  02/26/15 0500 02/27/15 0525 01/19/16 1011 01/19/16 1013  WBC 6.5  < > 9.8 7.7 8.3  --   NEUTROABS 4.9  --   --   --  5.2  --   HGB 12.1  < > 10.6* 10.5* 11.9* 12.9  HCT 35.8*  < > 31.4* 30.6* 34.2* 38.0  MCV 97.5  < > 94.9 95.0 98.6  --   PLT 269  < > 245 262 299  --   < > = values in this interval not displayed. Lab Results  Component Value Date   TSH 3.600 02/25/2015   Lab Results  Component Value Date   HGBA1C 6.2* 01/20/2016   Lab Results  Component Value Date   CHOL 138 01/20/2016   HDL 38* 01/20/2016   LDLCALC 91 01/20/2016   TRIG 44 01/20/2016   CHOLHDL 3.6 01/20/2016    Significant Diagnostic Results in last 30 days:  Dg Chest 2 View  01/19/2016  CLINICAL DATA:  Altered mental status and confusion since last night. EXAM: CHEST  2 VIEW COMPARISON:  Abdominal series radiograph dated 02/23/2015 FINDINGS: The cardiac silhouette is enlarged. Mediastinal contours appear intact. There is no evidence of focal airspace consolidation, pleural effusion or pneumothorax. Bilateral high density nodular opacities within the mid lung fields likely represent granulomas. Osseous structures are without acute abnormality. Soft tissues are grossly normal. IMPRESSION: Enlarged cardiac silhouette without evidence of pulmonary edema or focal airspace consolidation. Electronically Signed   By: Ted Mcalpine M.D.   On: 01/19/2016 10:44    Ct Head Wo Contrast  01/19/2016  CLINICAL DATA:  Altered mental status EXAM: CT HEAD WITHOUT CONTRAST TECHNIQUE: Contiguous axial images were obtained from the base of the skull through the vertex without intravenous contrast. COMPARISON:  None. FINDINGS: There is mild generalized brain atrophy with commensurate dilatation of the ventricles and sulci. Chronic small vessel ischemic changes noted within the bilateral periventricular and subcortical white matter regions. Ill-defined low-density focus within  the left frontal lobe is of uncertain age, more likely additional chronic ischemic change. There is no parenchymal mass or hemorrhage identified. No extra-axial hemorrhage. No acute osseous abnormality. Paranasal sinuses are clear. Superficial soft tissues are unremarkable. IMPRESSION: 1. Atrophy and chronic ischemic changes in the white matter. 2. Additional ill-defined low-density area within the left frontal lobe is of uncertain age, favored to be additional chronic ischemic change. Could confirm with brain MRI if clinically needed. 3. No intracranial mass or hemorrhage. Electronically Signed   By: Bary Richard M.D.   On: 01/19/2016 10:51   Mr Shirlee Latch Wo Contrast  01/20/2016  CLINICAL DATA:  82 year old hypertensive female with difficulty speaking and acute left frontal lobe infarct. Subsequent encounter. EXAM: MRA HEAD WITHOUT CONTRAST TECHNIQUE: Angiographic images of the Circle of Willis were obtained using MRA technique without intravenous contrast. COMPARISON:  01/19/2016 head CT and brain MR. FINDINGS: Moderate to marked focal stenosis mid aspect left middle cerebral artery M1 segment. Mild to moderate stenosis of proximal M1 segment right middle cerebral artery. Moderate middle cerebral artery branch vessel narrowing bilaterally with decrease number of visualized left middle cerebral artery branches consistent with patient's acute infarct. Ectatic vertebral arteries with dominant right  vertebral artery. Mild narrowing distal vertebral arteries without high-grade stenosis. Full extent of the left posterior inferior cerebellar artery not imaged. Narrowing of the distal aspect of the right posterior inferior cerebellar artery. Ectatic basilar artery with mild narrowing distal aspect. Moderate tandem stenosis superior cerebellar artery bilaterally. Marked narrowing and irregularity of the posterior cerebral arteries bilaterally with almost complete occlusion of a majority of the right posterior cerebral artery and long segment of the mid portion of the left posterior cerebral artery. Narrowing/ occluded distal left posterior cerebral artery branches. No aneurysm noted. IMPRESSION: Moderate to marked focal stenosis mid aspect left middle cerebral artery M1 segment. Mild to moderate stenosis of proximal M1 segment right middle cerebral artery. Moderate middle cerebral artery branch vessel narrowing bilaterally with decrease number of visualized left middle cerebral artery branches consistent with patient's acute infarct. Ectatic vertebral arteries with dominant right vertebral artery. Mild narrowing distal vertebral arteries without high-grade stenosis. Full extent of the left posterior inferior cerebellar artery not imaged. Narrowing of the distal aspect of the right posterior inferior cerebellar artery. Ectatic basilar artery with mild narrowing distal aspect. Moderate tandem stenosis superior cerebellar artery bilaterally. Marked narrowing and irregularity of the posterior cerebral arteries bilaterally with almost complete occlusion of a majority of the right posterior cerebral artery and long segment of the mid portion of the left posterior cerebral artery. Narrowing/ occluded distal left posterior cerebral artery branches. Electronically Signed   By: Lacy Duverney M.D.   On: 01/20/2016 16:13   Mr Laqueta Jean ZO Contrast  01/19/2016  CLINICAL DATA:  82 year old hypertensive female with sudden change  in mental status yesterday with difficulty expressing self. Subsequent encounter. EXAM: MRI HEAD WITHOUT AND WITH CONTRAST TECHNIQUE: Multiplanar, multiecho pulse sequences of the brain and surrounding structures were obtained without and with intravenous contrast. CONTRAST:  10mL MULTIHANCE GADOBENATE DIMEGLUMINE 529 MG/ML IV SOLN COMPARISON:  01/19/2016 CT.  No comparison MR. FINDINGS: Exam is motion degraded. Moderate-size mid left frontal lobe infarct involving left frontal operculum region with extension to left caudate body. Tiny amount of hemorrhage associated with infarct of the left caudate body with minimal deformity of the adjacent left lateral ventricle. Remote basal ganglia infarcts. Moderate small vessel disease changes. No intracranial mass or abnormal enhancement. Moderate  global atrophy without hydrocephalus. Ectatic vertebral arteries with narrowing of left vertebral artery suspected. Ectatic basilar artery. Internal carotid arteries are patent bilaterally. Post lens replacement otherwise orbital structures unremarkable. Opacification post left ethmoid sinus air cells and partial opacification left mastoid air cells. Mild transverse ligament hypertrophy and mild narrowing upper cervical spine. Cervical medullary junction, pituitary region pineal region unremarkable. IMPRESSION: Exam is motion degraded. Moderate-size mid left frontal lobe infarct involving left frontal operculum region with extension to left caudate body. Tiny amount of hemorrhage associated with infarct of the left caudate body with minimal deformity of the adjacent left lateral ventricle. Remote basal ganglia infarcts. Moderate small vessel disease changes. No intracranial mass or abnormal enhancement. Moderate global atrophy without hydrocephalus. Ectatic vertebral arteries with narrowing of left vertebral artery suspected. Ectatic basilar artery. Internal carotid arteries are patent bilaterally. Electronically Signed   By:  Lacy Duverney M.D.   On: 01/19/2016 12:53    Assessment/Plan  Localized edema Bilateral Lower extremity +3 edema without any redness or warm to touch. Ordering Bilateral Knee high Ted hose on in the morning and off at bedtime. Will start Furosemide 10 mg Tablet once daily. CMP 02/04/2016. Encourage to elevate legs.Monitor weight daily.     Family/ staff Communication: Reviewed plan of care with patient and facility Nurse Supervisor.   Labs/tests ordered: CMP 02/04/2016

## 2016-02-04 LAB — BASIC METABOLIC PANEL
BUN: 13 mg/dL (ref 4–21)
CREATININE: 0.6 mg/dL (ref 0.5–1.1)
GLUCOSE: 87 mg/dL
Potassium: 4.6 mmol/L (ref 3.4–5.3)
Sodium: 133 mmol/L — AB (ref 137–147)

## 2016-02-18 ENCOUNTER — Non-Acute Institutional Stay (SKILLED_NURSING_FACILITY): Payer: Medicare Other | Admitting: Family

## 2016-02-18 DIAGNOSIS — I69319 Unspecified symptoms and signs involving cognitive functions following cerebral infarction: Secondary | ICD-10-CM | POA: Diagnosis not present

## 2016-02-18 DIAGNOSIS — I69391 Dysphagia following cerebral infarction: Secondary | ICD-10-CM | POA: Diagnosis not present

## 2016-02-18 DIAGNOSIS — I639 Cerebral infarction, unspecified: Secondary | ICD-10-CM

## 2016-02-18 DIAGNOSIS — R600 Localized edema: Secondary | ICD-10-CM | POA: Diagnosis not present

## 2016-02-18 DIAGNOSIS — I482 Chronic atrial fibrillation, unspecified: Secondary | ICD-10-CM

## 2016-02-18 DIAGNOSIS — I10 Essential (primary) hypertension: Secondary | ICD-10-CM | POA: Diagnosis not present

## 2016-02-18 DIAGNOSIS — H409 Unspecified glaucoma: Secondary | ICD-10-CM | POA: Insufficient documentation

## 2016-02-18 DIAGNOSIS — H4010X0 Unspecified open-angle glaucoma, stage unspecified: Secondary | ICD-10-CM

## 2016-02-18 NOTE — Progress Notes (Signed)
Patient ID: Sarah Stone, female   DOB: 1918-11-18, 82 y.o.   MRN: [id number]  Location:  [facility]   Place of Service:  SNF (31)  Provider: Oneal Grout, MD   PCP: Enrique Sack, MD Patient Care Team: Nila Nephew, MD as PCP - General (Internal Medicine)  Extended Emergency Contact Information Primary Emergency Contact: Karper,Sandra Address: [address]          [place] [address] [place] Home Phone: [phone] Mobile Phone: [phone] Relation: Daughter Secondary Emergency Contact: Florestine Avers Address: [address]          [place], [place] [address] [place] of [place] Home Phone: [phone] Mobile Phone: [phone] Relation: Other  Code Status: Full Code  Goals of care:  Advanced Directive information Advanced Directives 02/18/2016  Does patient have an advance directive? No  Type of Advance Directive -  Does patient want to make changes to advanced directive? -  Copy of advanced directive(s) in chart? -  Would patient like information on creating an advanced directive? No - patient declined information     No Known Allergies  Chief Complaint  Patient presents with  . Discharge Note    HPI:  82 y.o. female  Seen today at [facility] for discharge home.She has been here for short term rehabilitation post hospital admission from 11/28/16-1/25/17with acute CVA. MRI brain showed a moderate-sized mid left frontal infarct She has a past medical history of CVA, HTN , Afib and Glaucoma. She is seen in her room today.She has worked with PT/OT/ST with much improvement. She denies any acute issues today. She continue to require continounous oxygen via Nasal cannula to keep sats > 90%. She is stable for discharge home with PT/OT/ST to evaluate and treat as indicated. Facility staff reports no concerns.     No past medical history on file.  Past Surgical History  Procedure Laterality Date  .  Inguinal hernia repair Left 02/24/2015    Procedure: HERNIA REPAIR INGUINAL INCARCERATED;  Surgeon: Glenna Fellows, MD;  Location: [facility];  Service: General;  Laterality: Left;  With MESH      reports that she has never smoked. She has never used smokeless tobacco. She reports that she does not drink alcohol or use illicit drugs. Social History   Social History  . Marital Status: Widowed    Spouse Name: N/A  . Number of Children: N/A  . Years of Education: N/A   Occupational History  . Not on file.   Social History Main Topics  . Smoking status: Never Smoker   . Smokeless tobacco: Never Used  . Alcohol Use: No  . Drug Use: No  . Sexual Activity: No   Other Topics Concern  . Not on file   Social History Narrative   Functional Status Survey:    No Known Allergies  Pertinent  Health Maintenance Due  Topic Date Due  . INFLUENZA VACCINE  02/25/2017 (Originally 07/30/2015)  . DEXA SCAN  02/25/2017 (Originally 04/29/1985)  . PNA vac Low Risk Adult (1 of 2 - PCV13) 02/25/2017 (Originally 04/29/1985)    Medications:   Medication List       This list is accurate as of: 02/18/16 12:53 PM.  Always use your most recent med list.               apixaban 2.5 MG Tabs tablet  Commonly known as:  ELIQUIS  Take 1 tablet (2.5 mg total) by mouth 2 (  two) times daily.     atenolol 25 MG tablet  Commonly known as:  TENORMIN  Take 25 mg by mouth daily.     atorvastatin 20 MG tablet  Commonly known as:  LIPITOR  Take 1 tablet (20 mg total) by mouth daily at 6 PM.     FUROSEMIDE PO  Take 10 mg by mouth daily. Hold if B/P < 110.     hydrochlorothiazide 25 MG tablet  Commonly known as:  HYDRODIURIL  Take 25 mg by mouth daily.     latanoprost 0.005 % ophthalmic solution  Commonly known as:  XALATAN  Place 1 drop into both eyes daily.     potassium chloride 10 MEQ tablet  Commonly known as:  K-DUR  Take 1 tablet (10 mEq total) by mouth daily.     timolol 0.5 % ophthalmic  solution  Commonly known as:  BETIMOL  Place 1 drop into the right eye daily.        Review of Systems  Constitutional: Negative.        Generalized weakness   HENT: Negative.   Eyes: Negative.   Respiratory: Negative.   Cardiovascular: Negative.   Gastrointestinal: Negative.   Endocrine: Negative.   Genitourinary: Negative.   Musculoskeletal: Negative.   Skin: Negative.   Allergic/Immunologic: Negative.   Neurological: Negative.   Hematological: Negative.   Psychiatric/Behavioral: Negative.     Filed Vitals:   02/18/16 1157  BP: 121/80  Pulse: 76  Temp: 98.9 F (37.2 C)  Resp: 20  Weight: 107 lb 9.6 oz (48.807 kg)  SpO2: 96%   Body mass index is 19.8 kg/(m^2). Physical Exam  Constitutional: She is oriented to person, place, and time. She appears well-developed and well-nourished. No distress.  HENT:  Head: Normocephalic.  Right Ear: External ear normal.  Mouth/Throat: Oropharynx is clear and moist.  Eyes: Conjunctivae and EOM are normal. Pupils are equal, round, and reactive to light. Right eye exhibits no discharge. Left eye exhibits no discharge. No scleral icterus.  Neck: Normal range of motion. No JVD present. No tracheal deviation present.  Cardiovascular: Normal rate, regular rhythm, normal heart sounds and intact distal pulses.  Exam reveals no gallop and no friction rub.   No murmur heard. Pulmonary/Chest: Effort normal and breath sounds normal. No respiratory distress. She has no wheezes. She has no rales. She exhibits no tenderness.  Continuous Oxygen 2 Liters Nasal cannula   Abdominal: Soft. Bowel sounds are normal. She exhibits no distension and no mass. There is no tenderness. There is no rebound and no guarding.  Musculoskeletal: Normal range of motion. She exhibits no tenderness.  Bilateral [facility] in place.   Lymphadenopathy:    She has no cervical adenopathy.  Neurological: She is oriented to person, place, and time.  Skin: Skin is warm and  dry. No rash noted. No erythema. No pallor.  Psychiatric: She has a normal mood and affect.    Labs reviewed: Basic Metabolic Panel:  Recent Labs  09/81/19 0500 02/27/15 0525 01/19/16 1011 01/19/16 1013 01/28/16 01/30/16 02/04/16  NA 131* 131* 132* 134* 132* 131* 133*  K 3.2* 3.4* 4.0 3.8 3.2* 3.8 4.6  CL 98 96 96* 94*  --   --   --   CO2 26 26 27   --   --   --   --   GLUCOSE 114* 108* 117* 116*  --   --   --   BUN 13 14 14 15 18 17  13  CREATININE 0.44* 0.57 0.53 0.60 0.6 0.5 0.6  CALCIUM 7.8* 7.9* 8.7*  --   --   --   --   MG  --  1.3*  --   --   --   --   --    Liver Function Tests:  Recent Labs  02/23/15 2305 01/19/16 1011 01/28/16  AST 26 38 100*  ALT 13 18 100*  ALKPHOS 70 67 128*  BILITOT 0.6 1.2  --   PROT 7.4 7.1  --   ALBUMIN 4.1 3.8  --     Recent Labs  02/23/15 2305  LIPASE 39    Recent Labs  01/19/16 1011  AMMONIA 27   CBC:  Recent Labs  02/23/15 2305  02/26/15 0500 02/27/15 0525 01/19/16 1011 01/19/16 1013 01/28/16 01/30/16  WBC 6.5  < > 9.8 7.7 8.3  --  9.8 8.1  NEUTROABS 4.9  --   --   --  5.2  --   --   --   HGB 12.1  < > 10.6* 10.5* 11.9* 12.9 11.2* 9.9*  HCT 35.8*  < > 31.4* 30.6* 34.2* 38.0 33* 28*  MCV 97.5  < > 94.9 95.0 98.6  --   --   --   PLT 269  < > 245 262 299  --  383 389  < > = values in this interval not displayed. Cardiac Enzymes: No results for input(s): CKTOTAL, CKMB, CKMBINDEX, TROPONINI in the last 8760 hours. BNP: Invalid input(s): POCBNP CBG:  Recent Labs  01/19/16 0954  GLUCAP 107*    Procedures and Imaging Studies During Stay: Mr Shirlee Latch Wo Contrast  01/20/2016  CLINICAL DATA:  82 year old hypertensive female with difficulty speaking and acute left frontal lobe infarct. Subsequent encounter. EXAM: MRA HEAD WITHOUT CONTRAST TECHNIQUE: Angiographic images of the Circle of Willis were obtained using MRA technique without intravenous contrast. COMPARISON:  01/19/2016 head CT and brain MR.  FINDINGS: Moderate to marked focal stenosis mid aspect left middle cerebral artery M1 segment. Mild to moderate stenosis of proximal M1 segment right middle cerebral artery. Moderate middle cerebral artery branch vessel narrowing bilaterally with decrease number of visualized left middle cerebral artery branches consistent with patient's acute infarct. Ectatic vertebral arteries with dominant right vertebral artery. Mild narrowing distal vertebral arteries without high-grade stenosis. Full extent of the left posterior inferior cerebellar artery not imaged. Narrowing of the distal aspect of the right posterior inferior cerebellar artery. Ectatic basilar artery with mild narrowing distal aspect. Moderate tandem stenosis superior cerebellar artery bilaterally. Marked narrowing and irregularity of the posterior cerebral arteries bilaterally with almost complete occlusion of a majority of the right posterior cerebral artery and long segment of the mid portion of the left posterior cerebral artery. Narrowing/ occluded distal left posterior cerebral artery branches. No aneurysm noted. IMPRESSION: Moderate to marked focal stenosis mid aspect left middle cerebral artery M1 segment. Mild to moderate stenosis of proximal M1 segment right middle cerebral artery. Moderate middle cerebral artery branch vessel narrowing bilaterally with decrease number of visualized left middle cerebral artery branches consistent with patient's acute infarct. Ectatic vertebral arteries with dominant right vertebral artery. Mild narrowing distal vertebral arteries without high-grade stenosis. Full extent of the left posterior inferior cerebellar artery not imaged. Narrowing of the distal aspect of the right posterior inferior cerebellar artery. Ectatic basilar artery with mild narrowing distal aspect. Moderate tandem stenosis superior cerebellar artery bilaterally. Marked narrowing and irregularity of the posterior cerebral arteries bilaterally  with  almost complete occlusion of a majority of the right posterior cerebral artery and long segment of the mid portion of the left posterior cerebral artery. Narrowing/ occluded distal left posterior cerebral artery branches. Electronically Signed   By: Lacy Duverney M.D.   On: 01/20/2016 16:13    Assessment/Plan:   1. Cognitive deficit S/P CVA (cerebrovascular accident) Has improved. Continue to monitor.   2. Chronic atrial fibrillation (HCC) Rate controlled. Continue on ELiquis 2.5 mg Tablet Bid and Lipitor F/U with PCP   3. Benign essential HTN B/p well controlled. Continue on Atenolol 25 mg Tablet daily, HCZT 25 mg Tablet daily. F/U with  PCP   4. Dysphagia S/P CVA (cerebrovascular accident) Has improved now on regular diet with Thin liquids. Will D/C home with ST. Aspiration precautions.   5. Glaucoma  Continue on Latanoprost 0.005%, Timolol 0.5%   6. Edema  Has improved. Continue on Furosemide and Bilateral Ted hose. Monitor BMP.   7. Hypokalemia  Continue on K-dur 10 meq Follow up with PCP to monitor BMP in 1 to 2 weeks.  8. CVA Continue on ELiquis and Atenolol. Will D/c with PT/OT/ST. Fall and Safety Precautions. She will require a Rollator to allow her to maintain current independence with ADL's.   Patient is being discharged with the following home health services:   PT/OT for ROM, Exercise, Gait Stability and Muscle strengthening  ST for swallowing evaluation and monitoring.   Patient is being discharged with the following durable medical equipment:    Rollator to allow her to maintain current independence with ADL's.    Patient has been advised to f/u with their PCP in 1-2 weeks to bring them up to date on their rehab stay.  Social services at facility was responsible for arranging this appointment.  Pt was provided with a 30 day supply of prescriptions for medications and refills must be obtained from their PCP.  For controlled substances, a more limited supply may  be provided adequate until PCP appointment only.  Future labs/tests needed:  CBC, BMP in 1 to 2 weeks.

## 2016-03-27 ENCOUNTER — Ambulatory Visit: Payer: Medicare Other | Admitting: Neurology

## 2017-02-03 ENCOUNTER — Inpatient Hospital Stay (HOSPITAL_COMMUNITY)
Admission: EM | Admit: 2017-02-03 | Discharge: 2017-02-05 | DRG: 390 | Disposition: A | Discharge status: Home Health | Payer: Medicare Other | Attending: Internal Medicine | Admitting: Internal Medicine

## 2017-02-03 ENCOUNTER — Encounter (HOSPITAL_COMMUNITY): Payer: Self-pay | Admitting: Emergency Medicine

## 2017-02-03 DIAGNOSIS — R54 Age-related physical debility: Secondary | ICD-10-CM | POA: Diagnosis present

## 2017-02-03 DIAGNOSIS — I69319 Unspecified symptoms and signs involving cognitive functions following cerebral infarction: Secondary | ICD-10-CM

## 2017-02-03 DIAGNOSIS — E876 Hypokalemia: Secondary | ICD-10-CM | POA: Diagnosis present

## 2017-02-03 DIAGNOSIS — K56609 Unspecified intestinal obstruction, unspecified as to partial versus complete obstruction: Secondary | ICD-10-CM | POA: Diagnosis not present

## 2017-02-03 DIAGNOSIS — Z79899 Other long term (current) drug therapy: Secondary | ICD-10-CM

## 2017-02-03 DIAGNOSIS — I10 Essential (primary) hypertension: Secondary | ICD-10-CM | POA: Diagnosis present

## 2017-02-03 DIAGNOSIS — Z7901 Long term (current) use of anticoagulants: Secondary | ICD-10-CM

## 2017-02-03 DIAGNOSIS — I482 Chronic atrial fibrillation, unspecified: Secondary | ICD-10-CM | POA: Diagnosis present

## 2017-02-03 DIAGNOSIS — F0390 Unspecified dementia without behavioral disturbance: Secondary | ICD-10-CM | POA: Diagnosis present

## 2017-02-03 HISTORY — DX: Cerebral infarction, unspecified: I63.9

## 2017-02-03 HISTORY — DX: Unspecified atrial fibrillation: I48.91

## 2017-02-03 LAB — CBC WITH DIFFERENTIAL/PLATELET
BASOS ABS: 0 10*3/uL (ref 0.0–0.1)
BASOS PCT: 0 %
EOS PCT: 1 %
Eosinophils Absolute: 0 10*3/uL (ref 0.0–0.7)
HCT: 39.6 % (ref 36.0–46.0)
Hemoglobin: 13.7 g/dL (ref 12.0–15.0)
LYMPHS PCT: 13 %
Lymphs Abs: 1.1 10*3/uL (ref 0.7–4.0)
MCH: 31.5 pg (ref 26.0–34.0)
MCHC: 34.6 g/dL (ref 30.0–36.0)
MCV: 91 fL (ref 78.0–100.0)
Monocytes Absolute: 0.7 10*3/uL (ref 0.1–1.0)
Monocytes Relative: 8 %
NEUTROS ABS: 6.5 10*3/uL (ref 1.7–7.7)
Neutrophils Relative %: 78 %
Platelets: 265 10*3/uL (ref 150–400)
RBC: 4.35 MIL/uL (ref 3.87–5.11)
RDW: 13.6 % (ref 11.5–15.5)
WBC: 8.3 10*3/uL (ref 4.0–10.5)

## 2017-02-03 LAB — COMPREHENSIVE METABOLIC PANEL
ALBUMIN: 3.9 g/dL (ref 3.5–5.0)
ALT: 12 U/L — AB (ref 14–54)
AST: 25 U/L (ref 15–41)
Alkaline Phosphatase: 84 U/L (ref 38–126)
Anion gap: 10 (ref 5–15)
BUN: 17 mg/dL (ref 6–20)
CO2: 27 mmol/L (ref 22–32)
CREATININE: 0.76 mg/dL (ref 0.44–1.00)
Calcium: 9.3 mg/dL (ref 8.9–10.3)
Chloride: 97 mmol/L — ABNORMAL LOW (ref 101–111)
GFR calc Af Amer: >60 mL/min (ref >60)
GFR calc non Af Amer: >60 mL/min (ref >60)
GLUCOSE: 147 mg/dL — AB (ref 65–99)
POTASSIUM: 3.3 mmol/L — AB (ref 3.5–5.1)
Sodium: 134 mmol/L — ABNORMAL LOW (ref 135–145)
Total Bilirubin: 0.7 mg/dL (ref 0.3–1.2)
Total Protein: 7.3 g/dL (ref 6.5–8.1)

## 2017-02-03 LAB — I-STAT TROPONIN, ED: TROPONIN I, POC: 0.01 ng/mL (ref 0.00–0.08)

## 2017-02-03 LAB — I-STAT CG4 LACTIC ACID, ED: Lactic Acid, Venous: 2.4 mmol/L (ref 0.5–1.9)

## 2017-02-03 MED ORDER — SODIUM CHLORIDE 0.9 % IV BOLUS (SEPSIS)
500.0000 mL | Freq: Once | INTRAVENOUS | Status: AC
  Administered 2017-02-03: 500 mL via INTRAVENOUS

## 2017-02-03 NOTE — ED Provider Notes (Signed)
[facility]-EMERGENCY DEPT Provider Note   CSN: [id number] Arrival date & time: 02/03/17  1941  By signing my name below, I, Elder Negus, attest that this documentation has been prepared under the direction and in the presence of Zadie Rhine, MD. Electronically Signed: Elder Negus, Scribe. 02/03/17. 11:34 PM.   History   Chief Complaint Chief Complaint  Patient presents with  . Abdominal Pain    HPI Sarah Stone is a 83 y.o. female with history of dementia, A-fib on Eliquis, HTN, and ischemic stroke.   The history is provided by the patient and a relative. No language interpreter was used.  Abdominal Pain   This is a new problem. The current episode started 3 to 5 hours ago. The problem has been resolved. Associated with: Abdominal distention. Constipation/Small volume bowel movement this evening.  Pertinent negatives include fever and headaches.   History provided by family. They state that approximately 6 hours ago the patient voiced complaints of diffuse abdominal pain. They further inspected and noticed a significantly distended abdomen and brought her to the emergency department. At interview, the patient denies any pain symptoms. She is only reporting constipation but is passing small volumes of stool today. She is requesting discharge home. She denies any headache, chest pain, or dyspnea. Denies any fever or vomiting. The patient denies any pain today; however family attributes this to a history of dementia. She does have a previous admission 2 years ago for bowel obstruction.     Past Medical History:  Diagnosis Date  . A-fib (HCC)   . Hypertension   . Stroke [facility])     Patient Active Problem List   Diagnosis Date Noted  . Glaucoma of both eyes 02/18/2016  . Dysarthria due to cerebrovascular accident (CVA) (HCC) 01/24/2016  . Dysphagia S/P CVA (cerebrovascular accident) 01/24/2016  . Cognitive deficit S/P CVA (cerebrovascular accident) 01/24/2016  . Ischemic  stroke of frontal lobe (HCC)   . CVA (cerebral infarction) 01/19/2016  . Benign essential HTN 01/19/2016  . Chronic atrial fibrillation (HCC) 01/19/2016  . Difficulty speaking   . Lactic acidosis     Past Surgical History:  Procedure Laterality Date  . dnc    . INGUINAL HERNIA REPAIR Left 02/24/2015   Procedure: HERNIA REPAIR INGUINAL INCARCERATED;  Surgeon: Glenna Fellows, MD;  Location: [facility] ORS;  Service: General;  Laterality: Left;  With MESH    OB History    No data available       Home Medications    Prior to Admission medications   Medication Sig Start Date End Date Taking? Authorizing Provider  apixaban (ELIQUIS) 2.5 MG TABS tablet Take 1 tablet (2.5 mg total) by mouth 2 (two) times daily. 01/23/16  Yes Shanker Levora Dredge, MD  atenolol (TENORMIN) 25 MG tablet Take 25 mg by mouth daily.   Yes Historical Provider, MD  hydrochlorothiazide (HYDRODIURIL) 25 MG tablet Take 25 mg by mouth daily. 12/09/14  Yes Historical Provider, MD  latanoprost (XALATAN) 0.005 % ophthalmic solution Place 1 drop into both eyes daily.  02/18/15  Yes Historical Provider, MD  timolol (BETIMOL) 0.5 % ophthalmic solution Place 1 drop into the right eye daily.   Yes Historical Provider, MD  atorvastatin (LIPITOR) 20 MG tablet Take 1 tablet (20 mg total) by mouth daily at 6 PM. Patient not taking: Reported on 02/03/2017 01/23/16   Maretta Bees, MD  potassium chloride (K-DUR) 10 MEQ tablet Take 1 tablet (10 mEq total) by mouth daily. Patient not taking: Reported on 02/03/2017  02/27/15   Nonie Hoyer, PA-C    Family History History reviewed. No pertinent family history.  Social History Social History  Substance Use Topics  . Smoking status: Never Smoker  . Smokeless tobacco: Never Used  . Alcohol use No     Allergies   Patient has no known allergies.   Review of Systems Review of Systems  Constitutional: Negative for chills and fever.  Respiratory: Negative for shortness of breath.     Cardiovascular: Negative for chest pain.  Gastrointestinal: Positive for abdominal distention and abdominal pain.  Neurological: Negative for headaches.  All other systems reviewed and are negative.    Physical Exam Updated Vital Signs BP 178/76 (BP Location: Right Arm)   Pulse 64   Temp 97.6 F (36.4 C) (Oral)   Resp 15   Ht 5\' 1"  (1.549 m)   Wt 113 lb (51.3 kg)   SpO2 100%   BMI 21.35 kg/m   Physical Exam CONSTITUTIONAL: Elderly, frail, no distress HEAD: Normocephalic/atraumatic EYES: EOMI/ ENMT: Mucous membranes moist NECK: supple no meningeal signs SPINE/BACK:entire spine nontender CV: Irregular rhythm  LUNGS: Lungs are clear to auscultation bilaterally, no apparent distress ABDOMEN: mild distention noted, soft, nontender, no rebound or guarding, bowel sounds noted throughout abdomen GU:no cva tenderness NEURO: Pt is awake/alert/appropriate, moves all extremitiesx4.  No facial droop.   EXTREMITIES: pulses normal/equal, full ROM SKIN: warm, color normal PSYCH: no abnormalities of mood noted, alert and oriented to situation  ED Treatments / Results  DIAGNOSTIC STUDIES: Oxygen Saturation is 100 percent on room air which is normal  by my interpretation.    COORDINATION OF CARE: 11:33 PM Discussed treatment plan with pt at bedside and pt agreed to plan.  Labs (all labs ordered are listed, but only abnormal results are displayed) Labs Reviewed  COMPREHENSIVE METABOLIC PANEL - Abnormal; Notable for the following:       Result Value   Sodium 134 (*)    Potassium 3.3 (*)    Chloride 97 (*)    Glucose, Bld 147 (*)    ALT 12 (*)    All other components within normal limits  URINALYSIS, ROUTINE W REFLEX MICROSCOPIC - Abnormal; Notable for the following:    Specific Gravity, Urine 1.033 (*)    All other components within normal limits  I-STAT CG4 LACTIC ACID, ED - Abnormal; Notable for the following:    Lactic Acid, Venous 2.40 (*)    All other components within  normal limits  URINE CULTURE  CBC WITH DIFFERENTIAL/PLATELET  Rosezena Sensor, ED    EKG  EKG Interpretation  Date/Time:  Tuesday February 03 2017 22:10:25 EST Ventricular Rate:  62 PR Interval:    QRS Duration: 90 QT Interval:  452 QTC Calculation: 459 R Axis:   -91 Text Interpretation:  Atrial fibrillation Left anterior fascicular block Probable anterior infarct, age indeterminate Since last tracing rate slower Confirmed by Effie Shy  MD, ELLIOTT [phone]) on 02/03/2017 10:30:23 PM       Radiology Ct Abdomen Pelvis W Contrast  Result Date: 02/04/2017 CLINICAL DATA:  Abdominal pain, diffuse EXAM: CT ABDOMEN AND PELVIS WITH CONTRAST TECHNIQUE: Multidetector CT imaging of the abdomen and pelvis was performed using the standard protocol following bolus administration of intravenous contrast. CONTRAST:  100 mL Isovue-300 intravenous COMPARISON:  02/24/2015 FINDINGS: Lower chest: Partially calcified 1.3 cm right upper lobe nodule, likely a granuloma. Coarse subpleural lingular nodule, calcified. Mild atelectasis in the right middle lobe. No acute infiltrate or effusion. There is cardiomegaly  with marked biatrial enlargement. Hepatobiliary: Mild intra hepatic biliary dilatation, unchanged. Contracted gallbladder without calcified stone. Stable hypodense lesions within the left hepatic lobe, largest measures 1.3 cm and is in the lateral segment. Pancreas: Slight prominence of the pancreatic duct as before. No peripancreatic inflammation. Coarse calcifications at the body could relate to chronic pancreatitis. Spleen: Normal in size without focal abnormality. Adrenals/Urinary Tract: Adrenal glands within normal limits. Left kidney unremarkable. Large cyst inferior pole of the right kidney with the dominant cyst measuring 4.3 by 4.1 cm. Peripheral calcification is noted. 1 cm cyst left kidney. The urinary bladder is unremarkable. Stomach/Bowel: Slightly enlarged stomach. There are multiple loops of  fluid-filled dilated small bowel within the anterior abdomen, measuring up to 3.4 cm with mucosal enhancement. Possible transition point anterior lower midline abdominal wall, series 2, image number 77. Appendix normal. Colon diverticular disease without acute inflammation. Vascular/Lymphatic: Extensive atherosclerotic vascular calcifications with mildly ectatic abdominal aorta. No grossly enlarged abdominal or pelvic lymph nodes. Reproductive: No adnexal masses. Other: Small free fluid in the pelvis.  No free air. Musculoskeletal: Moderate compression deformity of L4, new compared to prior CT. Degenerative changes. IMPRESSION: 1. Multiple loops of dilated fluid-filled small bowel within the anterior abdomen, suspicious for bowel obstruction. Possible transition point lower anterior midline abdomen ; findings could relate to adhesions. 2. Small free fluid in the pelvis. Colon diverticular disease without acute inflammation 3. Moderate compression deformity of L4, new compared to 2016 CT. 4. Slight decreased size of peripherally calcified cyst in the right kidney. 5. No change in mildly prominent pancreatic duct. Coarse calcifications could relate to chronic pancreatitis. Electronically Signed   By: Jasmine PangKim  Fujinaga M.D.   On: 02/04/2017 00:58    Procedures Procedures (including critical care time)  Medications Ordered in ED Medications  sodium chloride 0.9 % injection (not administered)  sodium chloride 0.9 % bolus 500 mL (500 mLs Intravenous New Bag/Given 02/03/17 2350)  iopamidol (ISOVUE-300) 61 % injection 100 mL (100 mLs Intravenous Contrast Given 02/04/17 0023)     Initial Impression / Assessment and Plan / ED Course  I have reviewed the triage vital signs and the nursing notes.  Pertinent labs  results that were available during my care of the patient were reviewed by me and considered in my medical decision making (see chart for details).     Pt stable No vomiting CT imaging shows SBO D/w dr  gardner for admission D/w family via phone and they were updated on plan  Final Clinical Impressions(s) / ED Diagnoses   Final diagnoses:  SBO (small bowel obstruction)    New Prescriptions New Prescriptions   No medications on file  I personally performed the services described in this documentation, which was scribed in my presence. The recorded information has been reviewed and is accurate.       Zadie Rhineonald Belia Febo, MD 02/04/17 [phone]

## 2017-02-03 NOTE — ED Notes (Signed)
Unable to get urine sample because the pt is sleeping.

## 2017-02-03 NOTE — ED Notes (Addendum)
RN currently at bedside collecting blood.

## 2017-02-03 NOTE — ED Triage Notes (Signed)
Patient complaining abdominal pain all over that got worse at 5 pm. Patient has had a bowel movement today but only a little bit has come out.

## 2017-02-04 ENCOUNTER — Emergency Department (HOSPITAL_COMMUNITY): Payer: Medicare Other

## 2017-02-04 DIAGNOSIS — Z79899 Other long term (current) drug therapy: Secondary | ICD-10-CM | POA: Diagnosis not present

## 2017-02-04 DIAGNOSIS — K56609 Unspecified intestinal obstruction, unspecified as to partial versus complete obstruction: Secondary | ICD-10-CM | POA: Diagnosis present

## 2017-02-04 DIAGNOSIS — I482 Chronic atrial fibrillation: Secondary | ICD-10-CM | POA: Diagnosis present

## 2017-02-04 DIAGNOSIS — Z7901 Long term (current) use of anticoagulants: Secondary | ICD-10-CM | POA: Diagnosis not present

## 2017-02-04 DIAGNOSIS — F0390 Unspecified dementia without behavioral disturbance: Secondary | ICD-10-CM | POA: Diagnosis present

## 2017-02-04 DIAGNOSIS — E876 Hypokalemia: Secondary | ICD-10-CM | POA: Diagnosis present

## 2017-02-04 DIAGNOSIS — I10 Essential (primary) hypertension: Secondary | ICD-10-CM | POA: Diagnosis present

## 2017-02-04 DIAGNOSIS — I69319 Unspecified symptoms and signs involving cognitive functions following cerebral infarction: Secondary | ICD-10-CM | POA: Diagnosis not present

## 2017-02-04 DIAGNOSIS — R54 Age-related physical debility: Secondary | ICD-10-CM | POA: Diagnosis present

## 2017-02-04 LAB — URINALYSIS, ROUTINE W REFLEX MICROSCOPIC
Bilirubin Urine: NEGATIVE
GLUCOSE, UA: NEGATIVE mg/dL
Hgb urine dipstick: NEGATIVE
Ketones, ur: NEGATIVE mg/dL
LEUKOCYTES UA: NEGATIVE
Nitrite: NEGATIVE
PH: 7 (ref 5.0–8.0)
PROTEIN: NEGATIVE mg/dL
Specific Gravity, Urine: 1.033 — ABNORMAL HIGH (ref 1.005–1.030)

## 2017-02-04 MED ORDER — ATENOLOL 25 MG PO TABS
25.0000 mg | ORAL_TABLET | Freq: Every day | ORAL | Status: DC
  Administered 2017-02-04: 25 mg via ORAL
  Filled 2017-02-04 (×2): qty 1

## 2017-02-04 MED ORDER — HYDROCHLOROTHIAZIDE 25 MG PO TABS: 25.0000 mg | ORAL_TABLET | Freq: Every day | ORAL | Status: DC

## 2017-02-04 MED ORDER — ENOXAPARIN SODIUM 40 MG/0.4ML ~~LOC~~ SOLN: 40.0000 mg | SUBCUTANEOUS | Status: DC

## 2017-02-04 MED ORDER — APIXABAN 2.5 MG PO TABS
2.5000 mg | ORAL_TABLET | Freq: Two times a day (BID) | ORAL | Status: DC
  Administered 2017-02-04 – 2017-02-05 (×3): 2.5 mg via ORAL
  Filled 2017-02-04 (×3): qty 1

## 2017-02-04 MED ORDER — IOPAMIDOL (ISOVUE-300) INJECTION 61%
100.0000 mL | Freq: Once | INTRAVENOUS | Status: AC | PRN
  Administered 2017-02-04: 100 mL via INTRAVENOUS

## 2017-02-04 MED ORDER — TIMOLOL MALEATE 0.5 % OP SOLN
1.0000 [drp] | Freq: Every day | OPHTHALMIC | Status: DC
  Administered 2017-02-04 – 2017-02-05 (×2): 1 [drp] via OPHTHALMIC
  Filled 2017-02-04: qty 5

## 2017-02-04 MED ORDER — SODIUM CHLORIDE 0.9 % IV SOLN
INTRAVENOUS | Status: DC
  Administered 2017-02-04: 04:00:00 via INTRAVENOUS

## 2017-02-04 MED ORDER — IOPAMIDOL (ISOVUE-300) INJECTION 61%
INTRAVENOUS | Status: AC
  Administered 2017-02-04: 100 mL via INTRAVENOUS
  Filled 2017-02-04: qty 100

## 2017-02-04 MED ORDER — SODIUM CHLORIDE 0.9 % IJ SOLN
INTRAMUSCULAR | Status: AC
  Filled 2017-02-04: qty 50

## 2017-02-04 MED ORDER — LATANOPROST 0.005 % OP SOLN
1.0000 [drp] | Freq: Every day | OPHTHALMIC | Status: DC
  Administered 2017-02-04: 1 [drp] via OPHTHALMIC
  Filled 2017-02-04: qty 2.5

## 2017-02-04 MED ORDER — FENTANYL CITRATE (PF) 100 MCG/2ML IJ SOLN
25.0000 ug | INTRAMUSCULAR | Status: DC | PRN
  Administered 2017-02-04: 25 ug via INTRAVENOUS
  Filled 2017-02-04: qty 2

## 2017-02-04 NOTE — ED Notes (Signed)
Pt attempted to use bedpan, but was unable to give us a sample.

## 2017-02-04 NOTE — H&P (Signed)
History and Physical    Sarah Stone ZOX:[id number] DOB: Jun 02, 1918 DOA: 02/03/2017   PCP: Sarah Sack, MD Chief Complaint:  Chief Complaint  Patient presents with  . Abdominal Pain    HPI: Sarah Stone is a 83 y.o. female with medical history significant of hernia repair in 2016.  Patient presents to the ED with c/o abd pain, abd distention, constipation and small volume BM this evening.  Onset 6 hours PTA.  Symptoms appear to be intermittent.  Only small volume of stool today.  Nothing makes symptoms better or worse.  ED Course: SBO on CT scan.  Review of Systems: As per HPI otherwise 10 point review of systems negative.    Past Medical History:  Diagnosis Date  . A-fib (HCC)   . Hypertension   . Stroke [facility])     Past Surgical History:  Procedure Laterality Date  . dnc    . INGUINAL HERNIA REPAIR Left 02/24/2015   Procedure: HERNIA REPAIR INGUINAL INCARCERATED;  Surgeon: Glenna Fellows, MD;  Location: [facility] ORS;  Service: General;  Laterality: Left;  With MESH     reports that she has never smoked. She has never used smokeless tobacco. She reports that she does not drink alcohol or use drugs.  No Known Allergies  History reviewed. No pertinent family history.   Prior to Admission medications   Medication Sig Start Date End Date Taking? Authorizing Provider  apixaban (ELIQUIS) 2.5 MG TABS tablet Take 1 tablet (2.5 mg total) by mouth 2 (two) times daily. 01/23/16  Yes Shanker Levora Dredge, MD  atenolol (TENORMIN) 25 MG tablet Take 25 mg by mouth daily.   Yes Historical Provider, MD  hydrochlorothiazide (HYDRODIURIL) 25 MG tablet Take 25 mg by mouth daily. 12/09/14  Yes Historical Provider, MD  latanoprost (XALATAN) 0.005 % ophthalmic solution Place 1 drop into both eyes daily.  02/18/15  Yes Historical Provider, MD  timolol (BETIMOL) 0.5 % ophthalmic solution Place 1 drop into the right eye daily.   Yes Historical Provider, MD    Physical Exam: Vitals:   02/04/17  0133 02/04/17 0200 02/04/17 0230 02/04/17 0300  BP: 169/91 183/91 165/89 129/80  Pulse: 75 71 72 (!) 34  Resp: 16 14 15 20   Temp:      TempSrc:      SpO2: 96% 94% 96% (!) 81%  Weight:      Height:          Constitutional: NAD, calm, comfortable Eyes: PERRL, lids and conjunctivae normal ENMT: Mucous membranes are moist. Posterior pharynx clear of any exudate or lesions.Normal dentition.  Neck: normal, supple, no masses, no thyromegaly Respiratory: clear to auscultation bilaterally, no wheezing, no crackles. Normal respiratory effort. No accessory muscle use.  Cardiovascular: Regular rate and rhythm, no murmurs / rubs / gallops. No extremity edema. 2+ pedal pulses. No carotid bruits.  Abdomen: no tenderness, no masses palpated. No hepatosplenomegaly. Bowel sounds positive.  Musculoskeletal: no clubbing / cyanosis. No joint deformity upper and lower extremities. Good ROM, no contractures. Normal muscle tone.  Skin: no rashes, lesions, ulcers. No induration Neurologic: CN 2-12 grossly intact. Sensation intact, DTR normal. Strength 5/5 in all 4.  Psychiatric: Normal judgment and insight. Alert and oriented x 3. Normal mood.    Labs on Admission: I have personally reviewed following labs and imaging studies  CBC:  Recent Labs Lab 02/03/17 2212  WBC 8.3  NEUTROABS 6.5  HGB 13.7  HCT 39.6  MCV 91.0  PLT 265   Basic Metabolic  Panel:  Recent Labs Lab 02/03/17 2212  NA 134*  K 3.3*  CL 97*  CO2 27  GLUCOSE 147*  BUN 17  CREATININE 0.76  CALCIUM 9.3   GFR: Estimated Creatinine Clearance: 31 mL/min (by C-G formula based on SCr of 0.76 mg/dL). Liver Function Tests:  Recent Labs Lab 02/03/17 2212  AST 25  ALT 12*  ALKPHOS 84  BILITOT 0.7  PROT 7.3  ALBUMIN 3.9   No results for input(s): LIPASE, AMYLASE in the last 168 hours. No results for input(s): AMMONIA in the last 168 hours. Coagulation Profile: No results for input(s): INR, PROTIME in the last 168  hours. Cardiac Enzymes: No results for input(s): CKTOTAL, CKMB, CKMBINDEX, TROPONINI in the last 168 hours. BNP (last 3 results) No results for input(s): PROBNP in the last 8760 hours. HbA1C: No results for input(s): HGBA1C in the last 72 hours. CBG: No results for input(s): GLUCAP in the last 168 hours. Lipid Profile: No results for input(s): CHOL, HDL, LDLCALC, TRIG, CHOLHDL, LDLDIRECT in the last 72 hours. Thyroid Function Tests: No results for input(s): TSH, T4TOTAL, FREET4, T3FREE, THYROIDAB in the last 72 hours. Anemia Panel: No results for input(s): VITAMINB12, FOLATE, FERRITIN, TIBC, IRON, RETICCTPCT in the last 72 hours. Urine analysis:    Component Value Date/Time   COLORURINE YELLOW 02/04/2017 0125   APPEARANCEUR CLEAR 02/04/2017 0125   LABSPEC 1.033 (H) 02/04/2017 0125   PHURINE 7.0 02/04/2017 0125   GLUCOSEU NEGATIVE 02/04/2017 0125   HGBUR NEGATIVE 02/04/2017 0125   BILIRUBINUR NEGATIVE 02/04/2017 0125   KETONESUR NEGATIVE 02/04/2017 0125   PROTEINUR NEGATIVE 02/04/2017 0125   UROBILINOGEN 0.2 02/24/2015 0202   NITRITE NEGATIVE 02/04/2017 0125   LEUKOCYTESUR NEGATIVE 02/04/2017 0125   Sepsis Labs: @LABRCNTIP (procalcitonin:4,lacticidven:4) )No results found for this or any previous visit (from the past 240 hour(s)).   Radiological Exams on Admission: Ct Abdomen Pelvis W Contrast  Result Date: 02/04/2017 CLINICAL DATA:  Abdominal pain, diffuse EXAM: CT ABDOMEN AND PELVIS WITH CONTRAST TECHNIQUE: Multidetector CT imaging of the abdomen and pelvis was performed using the standard protocol following bolus administration of intravenous contrast. CONTRAST:  100 mL Isovue-300 intravenous COMPARISON:  02/24/2015 FINDINGS: Lower chest: Partially calcified 1.3 cm right upper lobe nodule, likely a granuloma. Coarse subpleural lingular nodule, calcified. Mild atelectasis in the right middle lobe. No acute infiltrate or effusion. There is cardiomegaly with marked biatrial  enlargement. Hepatobiliary: Mild intra hepatic biliary dilatation, unchanged. Contracted gallbladder without calcified stone. Stable hypodense lesions within the left hepatic lobe, largest measures 1.3 cm and is in the lateral segment. Pancreas: Slight prominence of the pancreatic duct as before. No peripancreatic inflammation. Coarse calcifications at the body could relate to chronic pancreatitis. Spleen: Normal in size without focal abnormality. Adrenals/Urinary Tract: Adrenal glands within normal limits. Left kidney unremarkable. Large cyst inferior pole of the right kidney with the dominant cyst measuring 4.3 by 4.1 cm. Peripheral calcification is noted. 1 cm cyst left kidney. The urinary bladder is unremarkable. Stomach/Bowel: Slightly enlarged stomach. There are multiple loops of fluid-filled dilated small bowel within the anterior abdomen, measuring up to 3.4 cm with mucosal enhancement. Possible transition point anterior lower midline abdominal wall, series 2, image number 77. Appendix normal. Colon diverticular disease without acute inflammation. Vascular/Lymphatic: Extensive atherosclerotic vascular calcifications with mildly ectatic abdominal aorta. No grossly enlarged abdominal or pelvic lymph nodes. Reproductive: No adnexal masses. Other: Small free fluid in the pelvis.  No free air. Musculoskeletal: Moderate compression deformity of L4, new compared to prior  CT. Degenerative changes. IMPRESSION: 1. Multiple loops of dilated fluid-filled small bowel within the anterior abdomen, suspicious for bowel obstruction. Possible transition point lower anterior midline abdomen ; findings could relate to adhesions. 2. Small free fluid in the pelvis. Colon diverticular disease without acute inflammation 3. Moderate compression deformity of L4, new compared to 2016 CT. 4. Slight decreased size of peripherally calcified cyst in the right kidney. 5. No change in mildly prominent pancreatic duct. Coarse calcifications  could relate to chronic pancreatitis. Electronically Signed   By: Jasmine Pang M.D.   On: 02/04/2017 00:58    EKG: Independently reviewed.  Assessment/Plan Principal Problem:   Small bowel obstruction Active Problems:   Benign essential HTN   Chronic atrial fibrillation (HCC)   Cognitive deficit S/P CVA (cerebrovascular accident)    1. SBO - 1. NPO 2. IVF 3. Pain control if needed 4. NGT if symptoms worsen but appear to have improved at this point 5. Call surgery in AM but I would be shocked if they planned early surgical intervention for a first episode of SBO possibly due to adhesions in a 83 yo F. 2. HTN - continue home meds but hold HCTZ 3. A.Fib - continue eliquis   DVT prophylaxis: Eliquis Code Status: Full Family Communication: No family in room Consults called: None Admission status: Admit to inpatient   Hillary Bow DO [facility] Pager [phone] from 7PM-7AM  If 7AM-7PM, please contact the day physician for the patient [email or web address] Password [email or web address]  02/04/2017, 3:21 AM

## 2017-02-04 NOTE — Progress Notes (Signed)
Patient seen and examined. She was admitted by Dr Julian ReilGardner this morning. She is doing much better. She denies nausea, vomiting and abd pain. She was able to have one BM this morning and able to pass flatulus.  Vitals remains stable with slightly elevated Bp.  No surgery needed at this time. Cont Conservative management.  Antiemetics prn. Adv diet as tolerated.  Restart HCTZ 25mg  po if her BP remains elevated, otherwise can resume at the time of discharge.  CM and PT consulted- patient requested some help with ADLs at home.  Possible discharge tomorrow if she continues to improve and tolerating her diet.

## 2017-02-04 NOTE — ED Notes (Signed)
Attempted to call report

## 2017-02-04 NOTE — Progress Notes (Signed)
Spoke with family and patient at bedside. Patient lives at home alone, patient is independent for most ADL's but daughter states she needs assistance getting a bath and dressing. Patient is able to fix her own breakfast and lunch and her daughter comes to get her after work and takes her home to provide dinner. Patient also stays with daughter on the weekends. Son usually takes patient to the doctor. Daughter puts meds in a pill box for patient and she is able to manage taking own meds. Son and daughter asking about aid for a few hours, reviewed with them what is covered by insurance and what is out of pocket. Provided them [facility]. Awaiting PT evaluation.

## 2017-02-05 DIAGNOSIS — K56609 Unspecified intestinal obstruction, unspecified as to partial versus complete obstruction: Principal | ICD-10-CM

## 2017-02-05 DIAGNOSIS — I10 Essential (primary) hypertension: Secondary | ICD-10-CM

## 2017-02-05 DIAGNOSIS — I69319 Unspecified symptoms and signs involving cognitive functions following cerebral infarction: Secondary | ICD-10-CM

## 2017-02-05 DIAGNOSIS — E876 Hypokalemia: Secondary | ICD-10-CM

## 2017-02-05 LAB — BASIC METABOLIC PANEL
ANION GAP: 10 (ref 5–15)
BUN: 13 mg/dL (ref 6–20)
CALCIUM: 8.2 mg/dL — AB (ref 8.9–10.3)
CO2: 24 mmol/L (ref 22–32)
Chloride: 101 mmol/L (ref 101–111)
Creatinine, Ser: 0.61 mg/dL (ref 0.44–1.00)
GFR calc Af Amer: >60 mL/min (ref >60)
GFR calc non Af Amer: >60 mL/min (ref >60)
GLUCOSE: 192 mg/dL — AB (ref 65–99)
Potassium: 3 mmol/L — ABNORMAL LOW (ref 3.5–5.1)
Sodium: 135 mmol/L (ref 135–145)

## 2017-02-05 LAB — URINE CULTURE

## 2017-02-05 LAB — MAGNESIUM: Magnesium: 1.3 mg/dL — ABNORMAL LOW (ref 1.7–2.4)

## 2017-02-05 MED ORDER — POTASSIUM CHLORIDE CRYS ER 20 MEQ PO TBCR
40.0000 meq | EXTENDED_RELEASE_TABLET | Freq: Once | ORAL | Status: AC
  Administered 2017-02-05: 40 meq via ORAL
  Filled 2017-02-05: qty 2

## 2017-02-05 MED ORDER — MAGNESIUM SULFATE 2 GM/50ML IV SOLN
2.0000 g | Freq: Once | INTRAVENOUS | Status: AC
  Administered 2017-02-05: 2 g via INTRAVENOUS
  Filled 2017-02-05: qty 50

## 2017-02-05 NOTE — Discharge Summary (Signed)
Physician Discharge Summary  Sarah Stone ZOX:[id number] DOB: 1918/01/20 DOA: 02/03/2017  PCP: Enrique Sack, MD  Admit date: 02/03/2017 Discharge date: 02/05/2017   Recommendations for Outpatient Follow-Up:   1. Home health 2. Cbc, bmp 1 week   Discharge Diagnosis:   Principal Problem:   Small bowel obstruction Active Problems:   Benign essential HTN   Chronic atrial fibrillation (HCC)   Cognitive deficit S/P CVA (cerebrovascular accident)   Discharge disposition:  Home.  Discharge Condition: Improved.  Diet recommendation: soft   Wound care: None.   History of Present Illness:   Sarah Stone is a 83 y.o. female with medical history significant of hernia repair in 2016.  Patient presents to the ED with c/o abd pain, abd distention, constipation and small volume BM this evening.  Onset 6 hours PTA.  Symptoms appear to be intermittent.  Only small volume of stool today.  Nothing makes symptoms better or worse.   Hospital Course by Problem:   Hypokalemia/hypomagnesium -replete  SBO -resolved -eating well -+BM    Medical Consultants:    None.   Discharge Exam:   Vitals:   02/04/17 2114 02/05/17 0508  BP: 110/60 112/65  Pulse: 75 80  Resp: 14 16  Temp: 98.7 F (37.1 C) 98.6 F (37 C)   Vitals:   02/04/17 0631 02/04/17 1452 02/04/17 2114 02/05/17 0508  BP: (!) 164/83 (!) 154/84 110/60 112/65  Pulse:  67 75 80  Resp: 16 16 14 16   Temp: 97.9 F (36.6 C) 97.6 F (36.4 C) 98.7 F (37.1 C) 98.6 F (37 C)  TempSrc: Oral Oral Axillary Axillary  SpO2: 97% 93% 97% 98%  Weight:      Height:        Gen:  NAD    The results of significant diagnostics from this hospitalization (including imaging, microbiology, ancillary and laboratory) are listed below for reference.     Procedures and Diagnostic Studies:   Ct Abdomen Pelvis W Contrast  Result Date: 02/04/2017 CLINICAL DATA:  Abdominal pain, diffuse EXAM: CT ABDOMEN AND PELVIS WITH  CONTRAST TECHNIQUE: Multidetector CT imaging of the abdomen and pelvis was performed using the standard protocol following bolus administration of intravenous contrast. CONTRAST:  100 mL Isovue-300 intravenous COMPARISON:  02/24/2015 FINDINGS: Lower chest: Partially calcified 1.3 cm right upper lobe nodule, likely a granuloma. Coarse subpleural lingular nodule, calcified. Mild atelectasis in the right middle lobe. No acute infiltrate or effusion. There is cardiomegaly with marked biatrial enlargement. Hepatobiliary: Mild intra hepatic biliary dilatation, unchanged. Contracted gallbladder without calcified stone. Stable hypodense lesions within the left hepatic lobe, largest measures 1.3 cm and is in the lateral segment. Pancreas: Slight prominence of the pancreatic duct as before. No peripancreatic inflammation. Coarse calcifications at the body could relate to chronic pancreatitis. Spleen: Normal in size without focal abnormality. Adrenals/Urinary Tract: Adrenal glands within normal limits. Left kidney unremarkable. Large cyst inferior pole of the right kidney with the dominant cyst measuring 4.3 by 4.1 cm. Peripheral calcification is noted. 1 cm cyst left kidney. The urinary bladder is unremarkable. Stomach/Bowel: Slightly enlarged stomach. There are multiple loops of fluid-filled dilated small bowel within the anterior abdomen, measuring up to 3.4 cm with mucosal enhancement. Possible transition point anterior lower midline abdominal wall, series 2, image number 77. Appendix normal. Colon diverticular disease without acute inflammation. Vascular/Lymphatic: Extensive atherosclerotic vascular calcifications with mildly ectatic abdominal aorta. No grossly enlarged abdominal or pelvic lymph nodes. Reproductive: No adnexal masses. Other: Small free fluid in the  pelvis.  No free air. Musculoskeletal: Moderate compression deformity of L4, new compared to prior CT. Degenerative changes. IMPRESSION: 1. Multiple loops of  dilated fluid-filled small bowel within the anterior abdomen, suspicious for bowel obstruction. Possible transition point lower anterior midline abdomen ; findings could relate to adhesions. 2. Small free fluid in the pelvis. Colon diverticular disease without acute inflammation 3. Moderate compression deformity of L4, new compared to 2016 CT. 4. Slight decreased size of peripherally calcified cyst in the right kidney. 5. No change in mildly prominent pancreatic duct. Coarse calcifications could relate to chronic pancreatitis. Electronically Signed   By: Jasmine PangKim  Fujinaga M.D.   On: 02/04/2017 00:58     Labs:   Basic Metabolic Panel:  Recent Labs Lab 02/03/17 2212 02/05/17 0917  NA 134* 135  K 3.3* 3.0*  CL 97* 101  CO2 27 24  GLUCOSE 147* 192*  BUN 17 13  CREATININE 0.76 0.61  CALCIUM 9.3 8.2*  MG  --  1.3*   GFR Estimated Creatinine Clearance: 31 mL/min (by C-G formula based on SCr of 0.61 mg/dL). Liver Function Tests:  Recent Labs Lab 02/03/17 2212  AST 25  ALT 12*  ALKPHOS 84  BILITOT 0.7  PROT 7.3  ALBUMIN 3.9   No results for input(s): LIPASE, AMYLASE in the last 168 hours. No results for input(s): AMMONIA in the last 168 hours. Coagulation profile No results for input(s): INR, PROTIME in the last 168 hours.  CBC:  Recent Labs Lab 02/03/17 2212  WBC 8.3  NEUTROABS 6.5  HGB 13.7  HCT 39.6  MCV 91.0  PLT 265   Cardiac Enzymes: No results for input(s): CKTOTAL, CKMB, CKMBINDEX, TROPONINI in the last 168 hours. BNP: Invalid input(s): POCBNP CBG: No results for input(s): GLUCAP in the last 168 hours. D-Dimer No results for input(s): DDIMER in the last 72 hours. Hgb A1c No results for input(s): HGBA1C in the last 72 hours. Lipid Profile No results for input(s): CHOL, HDL, LDLCALC, TRIG, CHOLHDL, LDLDIRECT in the last 72 hours. Thyroid function studies No results for input(s): TSH, T4TOTAL, T3FREE, THYROIDAB in the last 72 hours.  Invalid input(s):  FREET3 Anemia work up No results for input(s): VITAMINB12, FOLATE, FERRITIN, TIBC, IRON, RETICCTPCT in the last 72 hours. Microbiology Recent Results (from the past 240 hour(s))  Urine culture     Status: Abnormal   Collection Time: 02/04/17  1:25 AM  Result Value Ref Range Status   Specimen Description URINE, CLEAN CATCH  Final   Special Requests NONE  Final   Culture MULTIPLE SPECIES PRESENT, SUGGEST RECOLLECTION (A)  Final   Report Status 02/05/2017 FINAL  Final     Discharge Instructions:   Discharge Instructions    Discharge instructions    Complete by:  As directed    BMP 1 week Soft diet   Increase activity slowly    Complete by:  As directed      Allergies as of 02/05/2017   No Known Allergies     Medication List    STOP taking these medications   hydrochlorothiazide 25 MG tablet Commonly known as:  HYDRODIURIL     TAKE these medications   apixaban 2.5 MG Tabs tablet Commonly known as:  ELIQUIS Take 1 tablet (2.5 mg total) by mouth 2 (two) times daily.   atenolol 25 MG tablet Commonly known as:  TENORMIN Take 25 mg by mouth daily.   latanoprost 0.005 % ophthalmic solution Commonly known as:  XALATAN Place 1 drop into both  eyes daily.   timolol 0.5 % ophthalmic solution Commonly known as:  BETIMOL Place 1 drop into the right eye daily.      Follow-up Information    [facility] Follow up.   Specialty:  Home Health Services Why:  physical therapy, occupational therapy, and bath aide Contact information: [address] [place] [address] [phone]        GREEN, EDWIN JAY, MD Follow up in 1 week(s).   Specialty:  Internal Medicine Contact information: [address] [place] [address] [phone]            Time coordinating discharge: 35 min  Signed:  Sharday Michl U Zamyah Wiesman   [facility] 02/05/2017, 10:53 AM

## 2017-02-05 NOTE — Progress Notes (Signed)
Pt was discharged home today. Instructions were reviewed with patient, and questions were answered. Pt was taken to main entrance via wheelchair.   

## 2017-02-05 NOTE — Progress Notes (Signed)
D[facility], they are unable to provide care until 2/12. [facility] and [facility] will be able to service patient by Friday. Spoke with patient and daughter and they are fine with this change. Plan for d/c home today.

## 2017-02-05 NOTE — Evaluation (Signed)
Physical Therapy Evaluation Patient Details Name: Sarah Stone MRN: [id number] DOB: 1918/02/29 Today's Date: 02/05/2017   History of Present Illness  82 yo female admitted with SBO. Hx of hernia repair 2016, Afib, CVA, HTN.   Clinical Impression  On eval, pt required Min guard-Min assist for mobility. She walked ~275 feet around until without a device and climbed 9 stairs with use of 1 rail. LOB x1 during ambulation while changing direction/turning and x1 during balance assessment. Discussed safety and fall risk. Recommended to pt that she use her walker for ambulation safety. Will follow during hospital stay.     Follow Up Recommendations Home health PT; Home Health OT; Supervision - Intermittent; Home Health Aide (if possible)    Equipment Recommendations  None recommended by PT    Recommendations for Other Services       Precautions / Restrictions Precautions Precautions: Fall Restrictions Weight Bearing Restrictions: No      Mobility  Bed Mobility Overal bed mobility: Modified Independent                Transfers Overall transfer level: Modified independent                  Ambulation/Gait Ambulation/Gait assistance: Min assist Ambulation Distance (Feet): 275 Feet Assistive device: None Gait Pattern/deviations: Step-through pattern;Decreased stride length     General Gait Details: LOB x 1 during ambulation-scissoring noted during that occurence. Assist needed to prevent fall. Otherwise, pt was Min guard assist.   Stairs Stairs: Yes Stairs assistance: Min guard Stair Management: Alternating pattern;One rail Right Number of Stairs: 9 General stair comments: close guard for safety. No physical assistance given.   Wheelchair Mobility    Modified Rankin (Stroke Patients Only)       Balance Overall balance assessment: Needs assistance           Standing balance-Leahy Scale: Fair Standing balance comment: Had pt perform static standing:  withstanding perturbation, EO/EC, narrow BOS- very close guarding needed. LOB during EC static standing. Also had pt perform 360 degree turn(s) and pick up of object-LOB during turn towards R.              High level balance activites: Turns;Direction changes               Pertinent Vitals/Pain Pain Assessment: No/denies pain    Home Living Family/patient expects to be discharged to:: Private residence Living Arrangements: Alone Available Help at Discharge: Family;Available PRN/intermittently Type of Home: House Home Access: Stairs to enter     Home Layout: Two level;Able to live on main level with bedroom/bathroom Home Equipment: Dan Humphreys - 2 wheels;Walker - 4 wheels      Prior Function Level of Independence: Independent with assistive device(s)         Comments: uses rollator in community or pt holds on to the arm of who she is with     Hand Dominance        Extremity/Trunk Assessment   Upper Extremity Assessment Upper Extremity Assessment: Overall WFL for tasks assessed    Lower Extremity Assessment Lower Extremity Assessment: Generalized weakness    Cervical / Trunk Assessment Cervical / Trunk Assessment: Kyphotic  Communication   Communication: No difficulties  Cognition Arousal/Alertness: Awake/alert Behavior During Therapy: WFL for tasks assessed/performed Overall Cognitive Status: Within Functional Limits for tasks assessed Area of Impairment: Memory     Memory: Decreased short-term memory              General Comments  Exercises     Assessment/Plan    PT Assessment Patient needs continued PT services  PT Problem List Decreased mobility;Decreased balance;Decreased safety awareness          PT Treatment Interventions Gait training;Therapeutic activities;Therapeutic exercise;Patient/family education;Functional mobility training;Balance training    PT Goals (Current goals can be found in the Care Plan section)  Acute Rehab  PT Goals Patient Stated Goal: home PT Goal Formulation: With patient/family Time For Goal Achievement: 02/19/17 Potential to Achieve Goals: Good    Frequency Min 3X/week   Barriers to discharge        Co-evaluation               End of Session Equipment Utilized During Treatment: Gait belt Activity Tolerance: Patient tolerated treatment well Patient left: in chair;with call bell/phone within reach;with family/visitor present;with chair alarm set           Time: [id number] PT Time Calculation (min) (ACUTE ONLY): 20 min   Charges:   PT Evaluation $PT Eval Low Complexity: 1 Procedure     PT G Codes:        Rebeca AlertJannie Tarquin Welcher, MPT Pager: [phone]

## 2017-05-20 ENCOUNTER — Encounter (HOSPITAL_COMMUNITY): Payer: Self-pay

## 2017-05-20 ENCOUNTER — Emergency Department (HOSPITAL_COMMUNITY): Payer: Medicare Other

## 2017-05-20 ENCOUNTER — Emergency Department (HOSPITAL_COMMUNITY)
Admission: EM | Admit: 2017-05-20 | Discharge: 2017-05-20 | Disposition: A | Discharge status: Home / Self Care | Payer: Medicare Other | Attending: Emergency Medicine | Admitting: Emergency Medicine

## 2017-05-20 DIAGNOSIS — I10 Essential (primary) hypertension: Secondary | ICD-10-CM | POA: Insufficient documentation

## 2017-05-20 DIAGNOSIS — Z79899 Other long term (current) drug therapy: Secondary | ICD-10-CM | POA: Insufficient documentation

## 2017-05-20 DIAGNOSIS — R0689 Other abnormalities of breathing: Secondary | ICD-10-CM | POA: Diagnosis not present

## 2017-05-20 DIAGNOSIS — Z7901 Long term (current) use of anticoagulants: Secondary | ICD-10-CM | POA: Diagnosis not present

## 2017-05-20 DIAGNOSIS — R42 Dizziness and giddiness: Secondary | ICD-10-CM | POA: Diagnosis not present

## 2017-05-20 DIAGNOSIS — Z8673 Personal history of transient ischemic attack (TIA), and cerebral infarction without residual deficits: Secondary | ICD-10-CM | POA: Insufficient documentation

## 2017-05-20 LAB — URINALYSIS, ROUTINE W REFLEX MICROSCOPIC
Bilirubin Urine: NEGATIVE
GLUCOSE, UA: NEGATIVE mg/dL
Hgb urine dipstick: NEGATIVE
KETONES UR: NEGATIVE mg/dL
LEUKOCYTES UA: NEGATIVE
Nitrite: NEGATIVE
PROTEIN: NEGATIVE mg/dL
Specific Gravity, Urine: 1.006 (ref 1.005–1.030)
pH: 7 (ref 5.0–8.0)

## 2017-05-20 LAB — BASIC METABOLIC PANEL
Anion gap: 8 (ref 5–15)
BUN: 15 mg/dL (ref 6–20)
CO2: 27 mmol/L (ref 22–32)
CREATININE: 0.68 mg/dL (ref 0.44–1.00)
Calcium: 9 mg/dL (ref 8.9–10.3)
Chloride: 99 mmol/L — ABNORMAL LOW (ref 101–111)
GFR calc Af Amer: >60 mL/min (ref >60)
GLUCOSE: 103 mg/dL — AB (ref 65–99)
POTASSIUM: 3.9 mmol/L (ref 3.5–5.1)
Sodium: 134 mmol/L — ABNORMAL LOW (ref 135–145)

## 2017-05-20 LAB — CBC
HCT: 34.8 % — ABNORMAL LOW (ref 36.0–46.0)
Hemoglobin: 11.4 g/dL — ABNORMAL LOW (ref 12.0–15.0)
MCH: 31.1 pg (ref 26.0–34.0)
MCHC: 32.8 g/dL (ref 30.0–36.0)
MCV: 95.1 fL (ref 78.0–100.0)
PLATELETS: 283 10*3/uL (ref 150–400)
RBC: 3.66 MIL/uL — ABNORMAL LOW (ref 3.87–5.11)
RDW: 14.5 % (ref 11.5–15.5)
WBC: 5.3 10*3/uL (ref 4.0–10.5)

## 2017-05-20 LAB — CBG MONITORING, ED: Glucose-Capillary: 103 mg/dL — ABNORMAL HIGH (ref 65–99)

## 2017-05-20 LAB — BRAIN NATRIURETIC PEPTIDE: B Natriuretic Peptide: 218.3 pg/mL — ABNORMAL HIGH (ref 0.0–100.0)

## 2017-05-20 NOTE — ED Provider Notes (Signed)
Medical screening examination/treatment/procedure(s) were conducted as a shared visit with non-physician practitioner(s) and myself.  I personally evaluated the patient during the encounter.   EKG Interpretation  Date/Time:  Wednesday May 20 2017 04:10:28 EDT Ventricular Rate:  75 PR Interval:    QRS Duration: 84 QT Interval:  451 QTC Calculation: 504 R Axis:   -104 Text Interpretation:  Atrial fibrillation Left anterior fascicular block No significant change was found Confirmed by Paula LibraMolpus, John ([id number]) on 05/20/2017 5:19:5838 AM      83 year old female who presents with dizziness. History of HTN and afib on Eliquis. Recently over past 2-3 weeks has noticed that when she gets up in the morning from bed, she feels lightheaded like she could pass out. She sits the feeling goes away. Throughout the day does not feel lightheaded with position changes, walking. No DOE, fatigue, chest pain. Has been taking lasix through PCP recently for LE swelling, significantly improving. No orthopnea, PND, vision or speech changes, fall or syncope, focal numbness/weakness.   She is in known afib, HR 60-70s. Do not think this is cause of dizziness. Neuro in tact. Blood work reassuring. CXR with cardiomegaly and mild interstitial edema. No DOE, orthopnea, PND or other symptoms concerning for severe CHFand taking lasix. Family states they are very close with PCP and will speak to him today regarding changes to medications. Her symptoms of dizziness, seem consistent with orthostasis as occurring out of bed, resolved with sitting, and subsequently asymptomatic during the day. Strict return and follow-up instructions reviewed. She expressed understanding of all discharge instructions and felt comfortable with the plan of care.      Lavera GuiseLiu, Kailei Cowens Duo, MD 05/20/17 [phone]

## 2017-05-20 NOTE — ED Provider Notes (Signed)
[facility]-EMERGENCY DEPT Provider Note   CSN: [id number] Arrival date & time: 05/20/17  6213     History   Chief Complaint Chief Complaint  Patient presents with  . Dizziness    HPI Sarah Stone is a 83 y.o. female.  HPI  Patient, with a past medical history of A-fib and hypertension, presents with one-day history of "dizziness, feeling like I am going to pass out" after waking up. No complaints of lightheadedness during the day. Denies loss of consciousness, falls, head injury. She states that she lives by herself and has been taking her medications daily. She reports decreased appetite but is still drinking fluids. She denies nausea, vomiting, abdominal pain, diarrhea, constipation. She reports a dry nonproductive cough at baseline with no changes today. She denies hemoptysis, chest pain, shortness of breath. She is able to walk and states that she has had no gait changes. Denies blurry vision, headache, fever, recent illness.  Past Medical History:  Diagnosis Date  . A-fib (HCC)   . Hypertension   . Stroke [facility])     Patient Active Problem List   Diagnosis Date Noted  . Small bowel obstruction (HCC) 02/04/2017  . Glaucoma of both eyes 02/18/2016  . Dysarthria due to cerebrovascular accident (CVA) 01/24/2016  . Dysphagia S/P CVA (cerebrovascular accident) 01/24/2016  . Cognitive deficit S/P CVA (cerebrovascular accident) 01/24/2016  . Ischemic stroke of frontal lobe (HCC)   . CVA (cerebral infarction) 01/19/2016  . Benign essential HTN 01/19/2016  . Chronic atrial fibrillation (HCC) 01/19/2016  . Difficulty speaking   . Lactic acidosis     Past Surgical History:  Procedure Laterality Date  . dnc    . INGUINAL HERNIA REPAIR Left 02/24/2015   Procedure: HERNIA REPAIR INGUINAL INCARCERATED;  Surgeon: Glenna Fellows, MD;  Location: [facility] ORS;  Service: General;  Laterality: Left;  With MESH    OB History    No data available       Home Medications    Prior to  Admission medications   Medication Sig Start Date End Date Taking? Authorizing Provider  apixaban (ELIQUIS) 2.5 MG TABS tablet Take 1 tablet (2.5 mg total) by mouth 2 (two) times daily. 01/23/16  Yes Ghimire, Werner Lean, MD  atenolol (TENORMIN) 25 MG tablet Take 25 mg by mouth daily.   Yes [provider]  furosemide (LASIX) 40 MG tablet Take 40 mg by mouth daily. 04/30/17  Yes [provider]  latanoprost (XALATAN) 0.005 % ophthalmic solution Place 1 drop into both eyes daily.  02/18/15  Yes [provider]  losartan (COZAAR) 50 MG tablet Take 1 tablet by mouth daily. 05/13/17  Yes [provider]  timolol (BETIMOL) 0.5 % ophthalmic solution Place 1 drop into the right eye 2 (two) times daily.    Yes [provider]    Family History History reviewed. No pertinent family history.  Social History Social History  Substance Use Topics  . Smoking status: Never Smoker  . Smokeless tobacco: Never Used  . Alcohol use No     Allergies   Patient has no known allergies.   Review of Systems Review of Systems  Constitutional: Positive for appetite change. Negative for chills and fever.  HENT: Negative for ear pain, rhinorrhea, sneezing and sore throat.   Eyes: Negative for photophobia and visual disturbance.  Respiratory: Positive for cough. Negative for chest tightness, shortness of breath and wheezing.   Cardiovascular: Negative for chest pain and palpitations.  Gastrointestinal: Negative for abdominal pain, blood  in stool, constipation, diarrhea, nausea and vomiting.  Genitourinary: Negative for dysuria, hematuria and urgency.  Musculoskeletal: Negative for myalgias.  Skin: Negative for rash.  Neurological: Positive for dizziness and light-headedness. Negative for syncope, weakness, numbness and headaches.  Psychiatric/Behavioral: Negative for agitation.     Physical Exam Updated Vital Signs BP (!) 154/78   Pulse 67   Temp 97.5 F (36.4 C)  (Oral)   Resp 15   SpO2 96%   Physical Exam  Constitutional: She is oriented to person, place, and time. She appears well-developed and well-nourished. No distress.  HENT:  Head: Normocephalic and atraumatic.  Right Ear: External ear normal.  Left Ear: External ear normal.  Nose: Nose normal.  Eyes: Conjunctivae and EOM are normal. Right eye exhibits no discharge. Left eye exhibits no discharge. No scleral icterus.  Neck: Normal range of motion. Neck supple.  Cardiovascular: Normal rate, normal heart sounds and intact distal pulses.  An irregularly irregular rhythm present. Exam reveals no gallop and no friction rub.   No murmur heard. Pulmonary/Chest: Effort normal. No respiratory distress. She has rhonchi in the left upper field and the left middle field.  Abdominal: Soft. Bowel sounds are normal. She exhibits no distension. There is no tenderness. There is no guarding.  Musculoskeletal: Normal range of motion. She exhibits edema. She exhibits no tenderness.  1+ bilateral pitting edema. No temperature changes or erythema noted.  Neurological: She is alert and oriented to person, place, and time. No sensory deficit. She exhibits normal muscle tone. Coordination normal.  Skin: Skin is warm and dry. No rash noted.  Psychiatric: She has a normal mood and affect.  Nursing note and vitals reviewed.    ED Treatments / Results  Labs (all labs ordered are listed, but only abnormal results are displayed) Labs Reviewed  BASIC METABOLIC PANEL - Abnormal; Notable for the following:       Result Value   Sodium 134 (*)    Chloride 99 (*)    Glucose, Bld 103 (*)    All other components within normal limits  CBC - Abnormal; Notable for the following:    RBC 3.66 (*)    Hemoglobin 11.4 (*)    HCT 34.8 (*)    All other components within normal limits  BRAIN NATRIURETIC PEPTIDE - Abnormal; Notable for the following:    B Natriuretic Peptide 218.3 (*)    All other components within normal  limits  CBG MONITORING, ED - Abnormal; Notable for the following:    Glucose-Capillary 103 (*)    All other components within normal limits  URINALYSIS, ROUTINE W REFLEX MICROSCOPIC    EKG  EKG Interpretation  Date/Time:  Wednesday May 20 2017 04:10:28 EDT Ventricular Rate:  75 PR Interval:    QRS Duration: 84 QT Interval:  451 QTC Calculation: 504 R Axis:   -104 Text Interpretation:  Atrial fibrillation Left anterior fascicular block No significant change was found Confirmed by Paula LibraMolpus, John ([id number]) on 05/20/2017 5:19:38 AM       Radiology Dg Chest 2 View  Result Date: 05/20/2017 CLINICAL DATA:  Cough. EXAM: CHEST  2 VIEW COMPARISON:  01/19/2016.  09/10/2007. FINDINGS: Mediastinum hilar structures normal. Cardiomegaly. Diffuse bilateral from interstitial prominence noted. Findings consistent with mild interstitial edema. Pneumonitis cannot be excluded. Calcified pulmonary nodules again noted consistent granulomas . No pleural effusion or pneumothorax . Small nodular densities noted over the soft tissues of the right upper extremity, these could represent skin lesions, clinical correlation suggested . IMPRESSION:  1. Cardiomegaly with diffuse bilateral from interstitial prominence suggesting interstitial edema. Pneumonitis cannot be excluded. 2. Small nodular densities are noted over the soft tissues of the right upper extremity. These could represent skin lesions. Clinical correlation suggested. Electronically Signed   By: Maisie Fus  Register   On: 05/20/2017 06:49    Procedures Procedures (including critical care time)  Medications Ordered in ED Medications - No data to display   Initial Impression / Assessment and Plan / ED Course  I have reviewed the triage vital signs and the nursing notes.  Pertinent labs & imaging results that were available during my care of the patient were reviewed by me and considered in my medical decision making (see chart for details).     Patient  presents with complaints of dizziness and lightheadedness. She lives by herself and states that she does take her medications daily. Lab work today revealed normal CBC, BMP and blood glucose. UA is negative for signs of UTI or dehydration. EKG showed atrial fibrillation which patient has a history of. She is on Eliquis states that she is compliant with her medication, and she lives by herself. She is otherwise active and able to walk at baseline. She is alert and oriented 3. No focal deficits found on neurological exam. Patient is not complaining of any pain at this time. Patient does have coarse breath sounds on the left side, and a chronic cough that she and family states has not changed. Chest x-ray was obtained and showed cardiomegaly with diffuse interstitial prominence suggesting interstitial edema or pneumonitis. BNP was 218.3 today.  Patient was recently started on losartan and Lasix. She states that Lasix has helped with her lower extremity edema. Patient and family are both comfortable with outpatient follow-up with PCP and will contact him today. She denies any other complaints at this time and appears safe for discharge. Strict return precautions given.  Patient was discussed with and seen by Dr. Verdie Mosher.  Final Clinical Impressions(s) / ED Diagnoses   Final diagnoses:  Dizziness    New Prescriptions New Prescriptions   No medications on file     Dietrich Pates, PA-C 05/20/17 [id number]    Lavera Guise, MD 05/20/17 [phone]

## 2017-05-20 NOTE — Discharge Instructions (Signed)
Follow-up with PCP as soon as possible for further evaluation. Continue home medications as previously prescribed. Return to ED for chest pain, trouble breathing, falls, weakness, loss of consciousness, increased swelling or inability to walk.

## 2017-05-20 NOTE — ED Triage Notes (Addendum)
BIB Family, stating that be c/o feeling lightheaded when standing at 0130 today. No slurred speech observed in triage, no HA complaint, pt is alert in triage and answers questions appropriately. Pt denies visual changes.

## 2017-05-20 NOTE — ED Notes (Signed)
Pt is alert and oriented x 3 and is verbally responsive. Pt reports that when she gets up at night she has been having dizzy spells in which make her feel as though she may fall down. Pt states that she does not get such episodes during the day. Pt denies any pain or discomfort. Pt has Hx of Afib and BLE  2 + pitting edema.

## 2018-07-07 ENCOUNTER — Encounter

## 2018-07-07 ENCOUNTER — Ambulatory Visit: Payer: Medicare Other | Admitting: Podiatry

## 2020-03-15 ENCOUNTER — Emergency Department (HOSPITAL_COMMUNITY): Payer: Medicare Other

## 2020-03-15 ENCOUNTER — Emergency Department (HOSPITAL_COMMUNITY)
Admission: EM | Admit: 2020-03-15 | Discharge: 2020-03-16 | Disposition: A | Discharge status: Home / Self Care | Payer: Medicare Other | Attending: Emergency Medicine | Admitting: Emergency Medicine

## 2020-03-15 DIAGNOSIS — F05 Delirium due to known physiological condition: Secondary | ICD-10-CM | POA: Diagnosis not present

## 2020-03-15 DIAGNOSIS — I10 Essential (primary) hypertension: Secondary | ICD-10-CM | POA: Insufficient documentation

## 2020-03-15 DIAGNOSIS — G309 Alzheimer's disease, unspecified: Secondary | ICD-10-CM | POA: Insufficient documentation

## 2020-03-15 DIAGNOSIS — Z79899 Other long term (current) drug therapy: Secondary | ICD-10-CM | POA: Diagnosis not present

## 2020-03-15 DIAGNOSIS — Z20822 Contact with and (suspected) exposure to covid-19: Secondary | ICD-10-CM | POA: Diagnosis not present

## 2020-03-15 DIAGNOSIS — I69319 Unspecified symptoms and signs involving cognitive functions following cerebral infarction: Secondary | ICD-10-CM

## 2020-03-15 DIAGNOSIS — R4689 Other symptoms and signs involving appearance and behavior: Secondary | ICD-10-CM | POA: Diagnosis not present

## 2020-03-15 DIAGNOSIS — Z7901 Long term (current) use of anticoagulants: Secondary | ICD-10-CM | POA: Diagnosis not present

## 2020-03-15 DIAGNOSIS — R4182 Altered mental status, unspecified: Secondary | ICD-10-CM | POA: Diagnosis present

## 2020-03-15 DIAGNOSIS — Z8673 Personal history of transient ischemic attack (TIA), and cerebral infarction without residual deficits: Secondary | ICD-10-CM | POA: Diagnosis not present

## 2020-03-15 LAB — COMPREHENSIVE METABOLIC PANEL
ALT: 12 U/L (ref 0–44)
AST: 20 U/L (ref 15–41)
Albumin: 3.8 g/dL (ref 3.5–5.0)
Alkaline Phosphatase: 91 U/L (ref 38–126)
Anion gap: 11 (ref 5–15)
BUN: 19 mg/dL (ref 8–23)
CO2: 26 mmol/L (ref 22–32)
Calcium: 8.8 mg/dL — ABNORMAL LOW (ref 8.9–10.3)
Chloride: 103 mmol/L (ref 98–111)
Creatinine, Ser: 0.75 mg/dL (ref 0.44–1.00)
GFR calc Af Amer: >60 mL/min (ref >60)
GFR calc non Af Amer: >60 mL/min (ref >60)
Glucose, Bld: 109 mg/dL — ABNORMAL HIGH (ref 70–99)
Potassium: 3.6 mmol/L (ref 3.5–5.1)
Sodium: 140 mmol/L (ref 135–145)
Total Bilirubin: 0.9 mg/dL (ref 0.3–1.2)
Total Protein: 6.6 g/dL (ref 6.5–8.1)

## 2020-03-15 LAB — CBC WITH DIFFERENTIAL/PLATELET
Abs Immature Granulocytes: 0.02 10*3/uL (ref 0.00–0.07)
Basophils Absolute: 0 10*3/uL (ref 0.0–0.1)
Basophils Relative: 1 %
Eosinophils Absolute: 0.1 10*3/uL (ref 0.0–0.5)
Eosinophils Relative: 2 %
HCT: 35.2 % — ABNORMAL LOW (ref 36.0–46.0)
Hemoglobin: 12.5 g/dL (ref 12.0–15.0)
Immature Granulocytes: 0 %
Lymphocytes Relative: 22 %
Lymphs Abs: 1.3 10*3/uL (ref 0.7–4.0)
MCH: 37.5 pg — ABNORMAL HIGH (ref 26.0–34.0)
MCHC: 35.5 g/dL (ref 30.0–36.0)
MCV: 105.7 fL — ABNORMAL HIGH (ref 80.0–100.0)
Monocytes Absolute: 0.6 10*3/uL (ref 0.1–1.0)
Monocytes Relative: 11 %
Neutro Abs: 3.9 10*3/uL (ref 1.7–7.7)
Neutrophils Relative %: 64 %
Platelets: 211 10*3/uL (ref 150–400)
RBC: 3.33 MIL/uL — ABNORMAL LOW (ref 3.87–5.11)
RDW: 15.1 % (ref 11.5–15.5)
WBC: 6 10*3/uL (ref 4.0–10.5)
nRBC: 0 % (ref 0.0–0.2)

## 2020-03-15 LAB — ACETAMINOPHEN LEVEL: Acetaminophen (Tylenol), Serum: <10 ug/mL — ABNORMAL LOW (ref 10–30)

## 2020-03-15 LAB — SALICYLATE LEVEL: Salicylate Lvl: <7 mg/dL — ABNORMAL LOW (ref 7.0–30.0)

## 2020-03-15 LAB — AMMONIA: Ammonia: 11 umol/L (ref 9–35)

## 2020-03-15 LAB — ETHANOL: Alcohol, Ethyl (B): <10 mg/dL (ref <10)

## 2020-03-15 MED ORDER — TIMOLOL MALEATE 0.5 % OP SOLN
1.0000 [drp] | Freq: Two times a day (BID) | OPHTHALMIC | Status: DC
  Administered 2020-03-16 (×2): 1 [drp] via OPHTHALMIC
  Filled 2020-03-15: qty 5

## 2020-03-15 MED ORDER — FERROUS SULFATE 325 (65 FE) MG PO TABS
325.0000 mg | ORAL_TABLET | Freq: Every day | ORAL | Status: DC
  Administered 2020-03-16: 325 mg via ORAL
  Filled 2020-03-15: qty 1

## 2020-03-15 MED ORDER — LOSARTAN POTASSIUM 50 MG PO TABS
50.0000 mg | ORAL_TABLET | Freq: Every day | ORAL | Status: DC
  Filled 2020-03-15: qty 1

## 2020-03-15 MED ORDER — NON FORMULARY: 3.0000 mg | Freq: Every day | Status: DC

## 2020-03-15 MED ORDER — OLANZAPINE 5 MG PO TABS
5.0000 mg | ORAL_TABLET | Freq: Every evening | ORAL | Status: DC | PRN
  Administered 2020-03-16: 5 mg via ORAL
  Filled 2020-03-15: qty 1

## 2020-03-15 MED ORDER — FUROSEMIDE 40 MG PO TABS
40.0000 mg | ORAL_TABLET | Freq: Two times a day (BID) | ORAL | Status: DC
  Administered 2020-03-16: 40 mg via ORAL
  Filled 2020-03-15: qty 1

## 2020-03-15 MED ORDER — LATANOPROST 0.005 % OP SOLN
1.0000 [drp] | Freq: Every day | OPHTHALMIC | Status: DC
  Administered 2020-03-16: 1 [drp] via OPHTHALMIC
  Filled 2020-03-15: qty 2.5

## 2020-03-15 MED ORDER — APIXABAN 2.5 MG PO TABS
2.5000 mg | ORAL_TABLET | Freq: Two times a day (BID) | ORAL | Status: DC
  Administered 2020-03-16 (×2): 2.5 mg via ORAL
  Filled 2020-03-15 (×2): qty 1

## 2020-03-15 MED ORDER — ATENOLOL 25 MG PO TABS
25.0000 mg | ORAL_TABLET | Freq: Every day | ORAL | Status: DC
  Filled 2020-03-15: qty 1

## 2020-03-15 NOTE — ED Triage Notes (Signed)
Patient arrived via EMS d/t family IVC'd patient. Family states she is not taking medications, hallucinating, and trying to run out of home. Per EMS, family stated that she has been trying to swing at them.
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
## 2020-03-16 ENCOUNTER — Other Ambulatory Visit: Payer: Self-pay

## 2020-03-16 DIAGNOSIS — R4689 Other symptoms and signs involving appearance and behavior: Secondary | ICD-10-CM | POA: Diagnosis not present

## 2020-03-16 LAB — URINALYSIS, ROUTINE W REFLEX MICROSCOPIC
Bilirubin Urine: NEGATIVE
Glucose, UA: NEGATIVE mg/dL
Hgb urine dipstick: NEGATIVE
Ketones, ur: NEGATIVE mg/dL
Nitrite: NEGATIVE
Protein, ur: NEGATIVE mg/dL
Specific Gravity, Urine: 1.011 (ref 1.005–1.030)
pH: 5 (ref 5.0–8.0)

## 2020-03-16 LAB — RAPID URINE DRUG SCREEN, HOSP PERFORMED
Amphetamines: NOT DETECTED
Barbiturates: NOT DETECTED
Benzodiazepines: NOT DETECTED
Cocaine: NOT DETECTED
Opiates: NOT DETECTED
Tetrahydrocannabinol: NOT DETECTED

## 2020-03-16 LAB — RESPIRATORY PANEL BY RT PCR (FLU A&B, COVID)
Influenza A by PCR: NEGATIVE
Influenza B by PCR: NEGATIVE
SARS Coronavirus 2 by RT PCR: NEGATIVE

## 2020-03-16 MED ORDER — MELATONIN 3 MG PO TABS
3.0000 mg | ORAL_TABLET | Freq: Every day | ORAL | Status: DC
  Administered 2020-03-16: 3 mg via ORAL
  Filled 2020-03-16: qty 1

## 2020-03-16 MED ORDER — SERTRALINE HCL 25 MG PO TABS: 25.0000 mg | ORAL_TABLET | Freq: Every day | ORAL | 0 refills | Status: DC

## 2020-03-16 MED ORDER — SERTRALINE HCL 50 MG PO TABS
25.0000 mg | ORAL_TABLET | Freq: Every day | ORAL | Status: DC
  Administered 2020-03-16: 25 mg via ORAL
  Filled 2020-03-16: qty 1

## 2020-03-16 NOTE — TOC Initial Note (Signed)
Transition of Care [facility]) - Initial/Assessment Note    Patient Details  Name: Sarah Stone MRN: [id number] Date of Birth: 10/01/1918  Transition of Care [facility]) CM/SW Contact:    Montine Circle, LCSW Phone Number: 03/16/2020, 11:14 AM  Clinical Narrative:                 [facility] consult received stating that patient's family is seeking respite care for patient. Patient came to the ED under IVC, but has since been cleared. CSW spoke with patient's daughter Sarah Stone [phone]). Sarah Stone reports she has been looking into respite care for patient. Sarah Stone report she takes care of her mom during the day, but is looking for in-home care for the evening. Sarah Stone reports patient does have a walker. She reports patient is able to get up on her own and make it to the bathroom (patient also did this while being in the ED). CSW recommended that if patient needed any equipment in the future she can reach out patient's PCP. Sarah Stone reports she is also looking at getting a referral for a neurologist. CSW provided Sarah Stone with resources for ALF, respite care, and 24 hour care and how to locate these. Sarah Stone reports she will pick patient up and will stay with her while she works on finding more care. Sarah Stone was very thankful.  Geralyn Corwin, LCSW Transitions of Care Department [facility] ED [phone]  Expected Discharge Plan: Home/Self Care Barriers to Discharge: No Barriers Identified   Patient Goals and CMS Choice Patient states their goals for this hospitalization and ongoing recovery are:: Per daughter, to find respite care      Expected Discharge Plan and Services Expected Discharge Plan: Home/Self Care In-house Referral: Clinical Social Work     Living arrangements for the past 2 months: Single Family Home                                      Prior Living Arrangements/Services Living arrangements for the past 2 months: Single Family Home Lives with:: Adult Children Patient  language and need for interpreter reviewed:: Yes Do you feel safe going back to the place where you live?: Yes      Need for Family Participation in Patient Care: Yes (Comment) Care giver support system in place?: Yes (comment) Current home services: DME Criminal Activity/Legal Involvement Pertinent to Current Situation/Hospitalization: No - Comment as needed  Activities of Daily Living Home Assistive Devices/Equipment: Hearing aid ADL Screening (condition at time of admission) Patient's cognitive ability adequate to safely complete daily activities?: No(dementia diagnosis) Is the patient deaf or have difficulty hearing?: Yes(pt has a hearing aid but still has difficult wearing when using) Does the patient have difficulty seeing, even when wearing glasses/contacts?: Yes Does the patient have difficulty concentrating, remembering, or making decisions?: Yes Patient able to express need for assistance with ADLs?: Yes(pt needs assistance due to dementia diagnosis) Does the patient have difficulty dressing or bathing?: Yes(daughter gives patient bathes) Independently performs ADLs?: Yes (appropriate for developmental age) Does the patient have difficulty walking or climbing stairs?: Yes Weakness of Legs: None Weakness of Arms/Hands: None  Permission Sought/Granted Permission sought to share information with : Family Supports Permission granted to share information with : Yes, Verbal Permission Granted  Share Information with NAME: Sarah Stone     Permission granted to share info w Relationship: daughter  Permission granted to share info w Contact Information: [phone]  Emotional Assessment Appearance:: Appears stated age Attitude/Demeanor/Rapport: Unable to Assess Affect (typically observed): Unable to Assess Orientation: : Oriented to Self Alcohol / Substance Use: Not Applicable Psych Involvement: No (comment)  Admission diagnosis:  IVC Patient Active Problem List   Diagnosis  Date Noted  . Small bowel obstruction ([place]) 02/04/2017  . Glaucoma of both eyes 02/18/2016  . Dysarthria due to cerebrovascular accident (CVA) 01/24/2016  . Dysphagia S/P CVA (cerebrovascular accident) 01/24/2016  . Cognitive deficit S/P CVA (cerebrovascular accident) 01/24/2016  . Ischemic stroke of frontal lobe ([place])   . CVA (cerebral infarction) 01/19/2016  . Benign essential HTN 01/19/2016  . Chronic atrial fibrillation ([place]) 01/19/2016  . Difficulty speaking   . Lactic acidosis    PCP:  Sueanne Margarita, DO Pharmacy:   [facility] [address] [place], [place]. [place] [address] Phone: [phone] Fax: [phone]     Social Determinants of Health (SDOH) Interventions    Readmission Risk Interventions No flowsheet data found.

## 2020-03-16 NOTE — Consult Note (Signed)
[facility] Psych ED Discharge  03/16/2020 10:40 AM Sarah Stone  MRN:  [id number] Principal Problem: <principal problem not specified> Discharge Diagnoses: Active Problems:   Cognitive deficit S/P CVA (cerebrovascular accident)   Subjective: Patient assessed by nurse practitioner along with Dr. Jola Babinski.  Patient significantly hard of hearing, patient also appeared confused by teleconsult format.  With assistance from RN able to communicate with patient. Patient alert to self only, per patient record this may be approximately baseline. Patient does not report suicidal or homicidal ideations.  Patient denies auditory and visual hallucinations. Behavioral health staff spoke with patient's daughter.  Patient's daughter request patient be admitted for rehabilitation.  Patient's daughter request medication to assist with mood.  Zoloft 25 mg recommended at this time. Patient denies all crisis criteria.   Total Time spent with patient: 30 minutes  Past Psychiatric History: NA  Past Medical History:  Past Medical History:  Diagnosis Date  . A-fib (HCC)   . Hypertension   . Stroke [facility])     Past Surgical History:  Procedure Laterality Date  . dnc    . INGUINAL HERNIA REPAIR Left 02/24/2015   Procedure: HERNIA REPAIR INGUINAL INCARCERATED;  Surgeon: Glenna Fellows, MD;  Location: [facility] ORS;  Service: General;  Laterality: Left;  With MESH   Family History: No family history on file. Family Psychiatric  History: Unknown Social History:  Social History   Substance and Sexual Activity  Alcohol Use No     Social History   Substance and Sexual Activity  Drug Use No    Social History   Socioeconomic History  . Marital status: Widowed    Spouse name: Not on file  . Number of children: Not on file  . Years of education: Not on file  . Highest education level: Not on file  Occupational History  . Not on file  Tobacco Use  . Smoking status: Never Smoker  . Smokeless tobacco: Never Used   Substance and Sexual Activity  . Alcohol use: No  . Drug use: No  . Sexual activity: Never  Other Topics Concern  . Not on file  Social History Narrative  . Not on file   Social Determinants of Health   Financial Resource Strain:   . Difficulty of Paying Living Expenses:   Food Insecurity:   . Worried About Programme researcher, broadcasting/film/video in the Last Year:   . Barista in the Last Year:   Transportation Needs:   . Freight forwarder (Medical):   Marland Kitchen Lack of Transportation (Non-Medical):   Physical Activity:   . Days of Exercise per Week:   . Minutes of Exercise per Session:   Stress:   . Feeling of Stress :   Social Connections:   . Frequency of Communication with Friends and Family:   . Frequency of Social Gatherings with Friends and Family:   . Attends Religious Services:   . Active Member of Clubs or Organizations:   . Attends Banker Meetings:   Marland Kitchen Marital Status:     Has this patient used any form of tobacco in the last 30 days? (Cigarettes, Smokeless Tobacco, Cigars, and/or Pipes) A prescription for an FDA-approved tobacco cessation medication was offered at discharge and the patient refused  Current Medications: Current Facility-Administered Medications  Medication Dose Route Frequency Provider Last Rate Last Admin  . apixaban (ELIQUIS) tablet 2.5 mg  2.5 mg Oral BID Antony Madura, PA-C   2.5 mg at 03/16/20 0056  . atenolol (  TENORMIN) tablet 25 mg  25 mg Oral Daily Antony Madura, PA-C      . ferrous sulfate tablet 325 mg  325 mg Oral Q breakfast Antony Madura, PA-C   325 mg at 03/16/20 0835  . furosemide (LASIX) tablet 40 mg  40 mg Oral BID Antony Madura, PA-C   40 mg at 03/16/20 0835  . latanoprost (XALATAN) 0.005 % ophthalmic solution 1 drop  1 drop Both Eyes QHS Humes, Tresa Endo, PA-C   1 drop at 03/16/20 0056  . losartan (COZAAR) tablet 50 mg  50 mg Oral Daily Antony Madura, PA-C      . Melatonin TABS 3 mg  3 mg Oral QHS Antony Madura, PA-C   3 mg at 03/16/20  0056  . OLANZapine (ZYPREXA) tablet 5 mg  5 mg Oral QHS PRN Antony Madura, PA-C   5 mg at 03/16/20 0056  . sertraline (ZOLOFT) tablet 25 mg  25 mg Oral Daily Patrcia Dolly, FNP      . timolol (TIMOPTIC) 0.5 % ophthalmic solution 1 drop  1 drop Right Eye BID Antony Madura, PA-C   1 drop at 03/16/20 2094   Current Outpatient Medications  Medication Sig Dispense Refill  . apixaban (ELIQUIS) 2.5 MG TABS tablet Take 1 tablet (2.5 mg total) by mouth 2 (two) times daily.    Marland Kitchen atenolol (TENORMIN) 25 MG tablet Take 25 mg by mouth daily.    . ferrous sulfate 325 (65 FE) MG tablet Take 325 mg by mouth daily with breakfast.    . furosemide (LASIX) 40 MG tablet Take 40 mg by mouth 2 (two) times daily.   0  . latanoprost (XALATAN) 0.005 % ophthalmic solution Place 1 drop into both eyes daily.   0  . losartan (COZAAR) 50 MG tablet Take 1 tablet by mouth daily.  0  . Multiple Vitamins-Minerals (PRESERVISION AREDS PO) Take 1 tablet by mouth in the morning and at bedtime.    . timolol (BETIMOL) 0.5 % ophthalmic solution Place 1 drop into the right eye 2 (two) times daily.      PTA Medications: (Not in a hospital admission)   Musculoskeletal: Strength & Muscle Tone: unable to assess Gait & Station: unable to assess Patient leans: N/A  Psychiatric Specialty Exam: Physical Exam Vitals and nursing note reviewed.  Constitutional:      Appearance: She is well-developed.  HENT:     Head: Normocephalic.     Ears:     Comments: Severely hearing impaired Cardiovascular:     Rate and Rhythm: Normal rate.  Pulmonary:     Effort: Pulmonary effort is normal.  Neurological:     Mental Status: She is alert. Mental status is at baseline.  Psychiatric:        Attention and Perception: Attention normal.        Mood and Affect: Mood normal.        Speech: Speech normal.        Behavior: Behavior normal.        Thought Content: Thought content normal.        Cognition and Memory: Cognition is impaired.         Judgment: Judgment normal.     Review of Systems  Constitutional: Negative.   HENT: Positive for hearing loss.   Eyes: Negative.   Respiratory: Negative.   Cardiovascular: Negative.   Gastrointestinal: Negative.   Genitourinary: Negative.   Musculoskeletal: Negative.   Skin: Negative.   Neurological: Negative.   Psychiatric/Behavioral: Positive  for confusion.    Blood pressure (!) 115/57, pulse (!) 52, temperature 98.4 F (36.9 C), temperature source Oral, resp. rate 16, height 5\' 1"  (1.549 m), weight 45.5 kg, SpO2 96 %.Body mass index is 18.95 kg/m.  General Appearance: Fairly Groomed  Eye Contact:  Telemedicine format appeared confusing for this patient  Speech:  Normal Rate  Volume:  Normal  Mood:  Euthymic  Affect:  Congruent  Thought Process:  Coherent, Goal Directed and Descriptions of Associations: Intact  Orientation:  Other:  oriented to self, likely at baseline  Thought Content:  Logical  Suicidal Thoughts:  No  Homicidal Thoughts:  No  Memory:  Immediate;   Poor Recent;   Poor Remote;   Poor  Judgement:  Impaired  Insight:  Lacking  Psychomotor Activity:  Normal  Concentration:  Concentration: Fair and Attention Span: Fair  Recall:  [employer] of Knowledge:  Good  Language:  Good  Akathisia:  No  Handed:  Right  AIMS (if indicated):     Assets:  Communication Skills Desire for Improvement Financial Resources/Insurance Housing Intimacy Leisure Time Physical Health Resilience Social Support  ADL's:  Impaired  Cognition:  Impaired,  Moderate  Sleep:        Demographic Factors:  Age 32 or older and Caucasian  Loss Factors: NA  Historical Factors: NA  Risk Reduction Factors:   Living with another person, especially a relative, Positive social support, Positive therapeutic relationship and Positive coping skills or problem solving skills  Continued Clinical Symptoms:  Medical Diagnoses and Treatments/Surgeries  Cognitive Features That  Contribute To Risk:  None    Suicide Risk:  Minimal: No identifiable suicidal ideation.  Patients presenting with no risk factors but with morbid ruminations; may be classified as minimal risk based on the severity of the depressive symptoms    Plan Of Care/Follow-up recommendations:  Case discussed with Dr. Mallie Darting. Recommend Zoloft 25 mg daily. Other:  Follow up with outpatient psychiatry and primary care provider  Disposition: Discharge Emmaline Kluver, [place] 03/16/2020, 10:40 AM

## 2020-03-16 NOTE — BH Assessment (Signed)
Attempted to complete patient's TTS assessment but staff stated that she is still sleeping. TTS will attempt to complete patients assessment at a later time.

## 2020-03-16 NOTE — ED Provider Notes (Addendum)
Emergency Medicine Observation Re-evaluation Note  Sarah Stone is a 86 y.o. female, seen on rounds today.  Pt initially presented to the ED for complaints of Altered Mental Status Currently, the patient is resting.  Physical Exam  BP 136/73 (BP Location: Left Arm)   Pulse (!) 55   Temp 98.4 F (36.9 C) (Oral)   Resp 16   Ht 5\' 1"  (1.549 m)   Wt 45.5 kg   SpO2 96%   BMI 18.95 kg/m  Physical Exam  ED Course / MDM  EKG:EKG Interpretation  Date/Time:  Thursday March 15 2020 22:18:37 EDT Ventricular Rate:  61 PR Interval:    QRS Duration: 78 QT Interval:  466 QTC Calculation: 469 R Axis:   -95 Text Interpretation: Atrial fibrillation Right superior axis deviation Anterior infarct , age undetermined Abnormal ECG No significant change since last tracing Confirmed by [id number] [phone]) on 03/15/2020 10:31:26 PM  Clinical Course as of Mar 16 738  Fri Mar 16, 2020  0332 Work up today has been reassuring. No UTI or electrolyte derangements. Head CT without acute intracranial process. Patient medically cleared. Pending TTS recommendations; may require Geri-psych placement for stabilization.   [KH]  0605 Spoke with the patient's daughter, 0606.  She can best be reached on her mobile number [phone].  Explained that patient is pending psychiatric assessment.  Daughter told that TTS will likely reach out for collateral information and/or information on disposition plan.  Daughter encouraged to call back with any questions.   [KH]    Clinical Course User Index [KH] [phone], PA-C   I have reviewed the labs performed to date as well as medications administered while in observation.  Recent changes in the last 24 hours include attempted TTS eval, unsuccessful. Plan  Current plan is for TTS eval. Patient is under full IVC at this time.   Antony Madura, MD 03/16/20 0740  10:30 AM-received a call from psychiatry.  They are resending her IVC.  She is psychiatrically  cleared.  They are placing a consult into TOC regarding possible respite.    03/18/20, MD 03/16/20 1659

## 2020-03-16 NOTE — ED Notes (Signed)
Pt has been cooperative with day-shift staff. Very HOH and is therefore difficult to commuicate with. Walks with a walker with assist x1.

## 2020-03-16 NOTE — ED Notes (Signed)
Pt discharged to her daughter. She was cooperative. Very HOH so daughter informed of discharge instructions and Zoloft being started.

## 2020-03-16 NOTE — BH Assessment (Signed)
[facility] Assessment Progress Note  Per Berneice Heinrich, FNP, this pt does not require psychiatric hospitalization at this time.  Pt presents under IVC initiate by pt's daughter and upheld by EDP April Palumbo, MD, which has been rescinded by Landry Mellow, MD.  No behavioral health resources are indicated for this pt.  A social work consult has been ordered for pt to address her psychosocial needs.  Pt's nurse, Diane, has been notified.  Doylene Canning, MA Triage Specialist [phone]

## 2020-03-16 NOTE — BH Assessment (Addendum)
Assessment Note  Sarah Stone is an 86 y.o. female with history of dementia, per daughter/MPOA Sarah Stone) [phone] or [phone]. Patient presents to [facility], with IVC taken out by family/daughter. Patient with increasingly aggressive behaviors. TTS counselor attempted to assess patient however she was not responsive (no eye contact or acknowledgement of sound). TTS counselor informed that patient is hard of hearing and even with hearing aids has limited ability to hear. Unable to determine if patient has SI, HI, and AVH's. She was calm and cooperative and observed to be laying in her hospital bed watching television.   Obtained collateral information from patient's daughter Sarah Stone): She reports that she is POA and MPOA. She reports an increase in aggressive behaviors over the past week. However, behaviors behaviors have persisted in the past but are typically manageable. The daughter states, "I am usually able to calm her down but it's becoming more challenging". She states that patient sleeps at home alone at night and spends the day with her. Occasionally, patient will call her in the middle of night stating, "I don't know what to do". The daughter allows patient to sleep at home alone because patient will otherwise become aggressive and it's to help her maintain a routine. The daughter is concerned that patient is paranoid. She is accusing her daughter is accusing her stealing purses and also money. Patient is requiring additional support with completing her ADL's. She needs assistance with taking a bath, which her daughter bathes her daily. She is able to prepare a basic breakfast (pour cereal and milk). Patient's ability to hear and see has also declined. She dresses herself but may need some prompting. Patient does not know the time of day or day of the week. Patient walks ok at this time. However, had to learn how to walk all over again because she was overmedicated with Ativan on a  previous hospital admission. The daughter also shares that their is no psych history. No prior inpatient hospitalizations for psych stabilization. Patient's behaviors are non other that an increase in agitation in the past week and potential declines related to her diagnosis of dementia. Patient has not expressed any thoughts to harm herself. However, will threaten family if they will not let her sleep at home.   Diagnosis: Alzheimers with behavioral disturbance.   Past Medical History:  Past Medical History:  Diagnosis Date  . A-fib (HCC)   . Hypertension   . Stroke [facility])     Past Surgical History:  Procedure Laterality Date  . dnc    . INGUINAL HERNIA REPAIR Left 02/24/2015   Procedure: HERNIA REPAIR INGUINAL INCARCERATED;  Surgeon: Glenna Fellows, MD;  Location: [facility];  Service: General;  Laterality: Left;  With MESH    Family History: No family history on file.  Social History:  reports that she has never smoked. She has never used smokeless tobacco. She reports that she does not drink alcohol or use drugs.  Additional Social History:  Alcohol / Drug Use Pain Medications: SEE MAR Prescriptions: SEE MAR Over the Counter: SEE MAR History of alcohol / drug use?: No history of alcohol / drug abuse  CIWA: CIWA-Ar BP: (!) 115/57 Pulse Rate: (!) 52 COWS:    Allergies: No Known Allergies  Home Medications: (Not in a hospital admission)   OB/GYN Status:  No LMP recorded. Patient is postmenopausal.  General Assessment Data Assessment unable to be completed: Yes Reason for not completing assessment: pt unable to particpate, not roused given sleep meds [facility]  [facility]: [facility] TTS Assessment: In system Is this a Tele or Face-to-Face Assessment?: Tele Assessment Is this an Initial Assessment or a Re-assessment for this encounter?: Initial Assessment Patient Accompanied by:: (GPD/IVC'd by family) Language Other than English: No Living Arrangements: (only sleeps at  home;daughter has her during the day) What gender do you identify as?: Female Marital status: Single Maiden name: (n/a) Pregnancy Status: No Can pt return to current living arrangement?: Yes Admission Status: Voluntary Is patient capable of signing voluntary admission?: Yes Referral Source: Self/Family/Friend Insurance type: Passenger transport manager and Administrator, Civil Service)     [place]: Other:(no legal guardian/daughter has MPOA) Name of Psychiatrist: (no psychiatrist ) Name of Therapist: (no therapist )  Education Status Is patient currently in school?: No Is the patient employed, unemployed or receiving disability?: (n/a)  Risk to self with the past 6 months Suicidal Ideation: No(UTA from patient; collateral from daughter provided ) Has patient been a risk to self within the past 6 months prior to admission? : No Suicidal Intent: No Has patient had any suicidal intent within the past 6 months prior to admission? : No Is patient at risk for suicide?: No Suicidal Plan?: No Has patient had any suicidal plan within the past 6 months prior to admission? : No Access to Means: No What has been your use of drugs/alcohol within the last 12 months?: (n/a) Previous Attempts/Gestures: No How many times?: (0) Other Self Harm Risks: (n/a) Triggers for Past Attempts: (no triggers for past attempts ) Intentional Self Injurious Behavior: None Family Suicide History: No Recent stressful life event(s): Other (Comment)(no stress noted ) Persecutory voices/beliefs?: No Depression: No Depression Symptoms: (n/a) Substance abuse history and/or treatment for substance abuse?: No Suicide prevention information given to non-admitted patients: Not applicable  Risk to Others within the past 6 months Homicidal Ideation: No Does patient have any lifetime risk of violence toward others beyond the six months prior to admission? : No Thoughts of Harm to Others: No Current Homicidal Intent: No Current  Homicidal Plan: No Access to Homicidal Means: No Identified Victim: (n/a) History of harm to others?: No Assessment of Violence: None Noted Violent Behavior Description: (patient is calm and cooperative ) Does patient have access to weapons?: No Criminal Charges Pending?: No Does patient have a court date: No Is patient on probation?: No  Psychosis Hallucinations: (pt did not confirn or deny; daughter suspects hallucinations) Delusions: None noted(daughter suspects hallucinations )  Mental Status Report Appearance/Hygiene: In hospital gown Eye Contact: Poor Motor Activity: Freedom of movement Speech: Unable to assess Level of Consciousness: Alert Mood: Preoccupied Affect: Appropriate to circumstance Anxiety Level: None Thought Processes: Circumstantial(diagnosed with dementia) Judgement: Impaired Orientation: Not oriented Obsessive Compulsive Thoughts/Behaviors: None  Cognitive Functioning Concentration: Decreased Memory: Remote Impaired, Recent Impaired Is patient IDD: No Insight: Poor Impulse Control: Poor Appetite: Fair Have you had any weight changes? : No Change Sleep: Decreased Total Hours of Sleep: (wakes up during middle of night at times) Vegetative Symptoms: None  ADLScreening [facility] Assessment Services) Patient's cognitive ability adequate to safely complete daily activities?: No(dementia diagnosis) Patient able to express need for assistance with ADLs?: Yes(pt needs assistance due to dementia diagnosis) Independently performs ADLs?: Yes (appropriate for developmental age)  Prior Inpatient Therapy Prior Inpatient Therapy: No  Prior Outpatient Therapy Prior Outpatient Therapy: No Does patient have an ACCT team?: No Does patient have Intensive In-House Services?  : No Does patient have [facility] services? : No Does patient have P4CC services?: No  ADL  Screening (condition at time of admission) Patient's cognitive ability adequate to safely complete daily  activities?: No(dementia diagnosis) Is the patient deaf or have difficulty hearing?: Yes(pt has a hearing aid but still has difficult wearing when using) Does the patient have difficulty seeing, even when wearing glasses/contacts?: Yes Does the patient have difficulty concentrating, remembering, or making decisions?: Yes Patient able to express need for assistance with ADLs?: Yes(pt needs assistance due to dementia diagnosis) Does the patient have difficulty dressing or bathing?: Yes(daughter gives patient bathes) Independently performs ADLs?: Yes (appropriate for developmental age) Does the patient have difficulty walking or climbing stairs?: Yes Weakness of Legs: None Weakness of Arms/Hands: None  Home Assistive Devices/Equipment Home Assistive Devices/Equipment: Hearing aid  Therapy Consults (therapy consults require a physician order) PT Evaluation Needed: No OT Evalulation Needed: No SLP Evaluation Needed: No Abuse/Neglect Assessment (Assessment to be complete while patient is alone) Physical Abuse: Denies Verbal Abuse: Denies Sexual Abuse: Denies Exploitation of patient/patient's resources: Denies Self-Neglect: Denies Values / Beliefs Cultural Requests During Hospitalization: None Spiritual Requests During Hospitalization: None Consults Spiritual Care Consult Needed: No Transition of Care Team Consult Needed: No Advance Directives (For Healthcare) Does Patient Have a Medical Advance Directive?: (Patient has medical power of attorney) Would patient like information on creating a medical advance directive?: No - Patient declined Nutrition Screen- [facility] Adult/[facility] Patient's home diet: Regular Has the patient recently lost weight without trying?: No Has the patient been eating poorly because of a decreased appetite?: No Malnutrition Screening Tool Score: 0        Disposition: Patient is psych cleared, per Berneice Heinrich, NP. Patient does not meet criteria for inpatient  treatment. SW consulted to speak with her MPOA/daughter regarding resources available to support patient.  Disposition Initial Assessment Completed for this Encounter: Yes  On Site Evaluation by:   Reviewed with Physician:    Melynda Ripple 03/16/2020 10:48 AM

## 2020-03-16 NOTE — BHH Counselor (Signed)
Per nurse, pt unable to participate in assessment at this time, pt given Zyprexa and sleeping. Nurse to call TTS back when pt is roused and able to complete assessment.

## 2020-03-16 NOTE — BHH Counselor (Signed)
At 0050, clinician spoke to [place] at [facility] and noted the TTS cart is in use. Clinician expressed if another TTS consult comes in the que clinician will move on since TTS cart is currently in use.    Redmond Pulling, MS, [facility], [facility] Triage Specialist [phone].

## 2020-03-16 NOTE — ED Notes (Signed)
Atenolol and Cozaar held r/t recent BP and HR per Dr. Charm Barges.

## 2020-03-17 LAB — URINE CULTURE: Culture: 10000 — AB

## 2020-05-23 ENCOUNTER — Emergency Department (HOSPITAL_COMMUNITY): Payer: Medicare Other

## 2020-05-23 ENCOUNTER — Encounter (HOSPITAL_COMMUNITY): Payer: Self-pay | Admitting: Emergency Medicine

## 2020-05-23 ENCOUNTER — Inpatient Hospital Stay (HOSPITAL_COMMUNITY)
Admission: EM | Admit: 2020-05-23 | Discharge: 2020-05-29 | DRG: 871 | Disposition: E | Payer: Medicare Other | Source: Skilled Nursing Facility | Attending: Internal Medicine | Admitting: Internal Medicine

## 2020-05-23 DIAGNOSIS — H9190 Unspecified hearing loss, unspecified ear: Secondary | ICD-10-CM | POA: Diagnosis present

## 2020-05-23 DIAGNOSIS — F0390 Unspecified dementia without behavioral disturbance: Secondary | ICD-10-CM | POA: Diagnosis present

## 2020-05-23 DIAGNOSIS — I110 Hypertensive heart disease with heart failure: Secondary | ICD-10-CM | POA: Diagnosis present

## 2020-05-23 DIAGNOSIS — I5032 Chronic diastolic (congestive) heart failure: Secondary | ICD-10-CM | POA: Diagnosis present

## 2020-05-23 DIAGNOSIS — G9341 Metabolic encephalopathy: Secondary | ICD-10-CM | POA: Diagnosis present

## 2020-05-23 DIAGNOSIS — J189 Pneumonia, unspecified organism: Secondary | ICD-10-CM | POA: Diagnosis present

## 2020-05-23 DIAGNOSIS — R1310 Dysphagia, unspecified: Secondary | ICD-10-CM | POA: Diagnosis present

## 2020-05-23 DIAGNOSIS — R627 Adult failure to thrive: Secondary | ICD-10-CM | POA: Diagnosis present

## 2020-05-23 DIAGNOSIS — I482 Chronic atrial fibrillation, unspecified: Secondary | ICD-10-CM | POA: Diagnosis present

## 2020-05-23 DIAGNOSIS — L89616 Pressure-induced deep tissue damage of right heel: Secondary | ICD-10-CM | POA: Diagnosis present

## 2020-05-23 DIAGNOSIS — Z20822 Contact with and (suspected) exposure to covid-19: Secondary | ICD-10-CM | POA: Diagnosis present

## 2020-05-23 DIAGNOSIS — E876 Hypokalemia: Secondary | ICD-10-CM | POA: Diagnosis present

## 2020-05-23 DIAGNOSIS — R54 Age-related physical debility: Secondary | ICD-10-CM | POA: Diagnosis present

## 2020-05-23 DIAGNOSIS — Z7189 Other specified counseling: Secondary | ICD-10-CM | POA: Diagnosis not present

## 2020-05-23 DIAGNOSIS — Z681 Body mass index (BMI) 19 or less, adult: Secondary | ICD-10-CM

## 2020-05-23 DIAGNOSIS — R64 Cachexia: Secondary | ICD-10-CM | POA: Diagnosis present

## 2020-05-23 DIAGNOSIS — H4089 Other specified glaucoma: Secondary | ICD-10-CM | POA: Diagnosis present

## 2020-05-23 DIAGNOSIS — D509 Iron deficiency anemia, unspecified: Secondary | ICD-10-CM | POA: Diagnosis present

## 2020-05-23 DIAGNOSIS — Z515 Encounter for palliative care: Secondary | ICD-10-CM | POA: Diagnosis not present

## 2020-05-23 DIAGNOSIS — L89611 Pressure ulcer of right heel, stage 1: Secondary | ICD-10-CM | POA: Diagnosis present

## 2020-05-23 DIAGNOSIS — Z79899 Other long term (current) drug therapy: Secondary | ICD-10-CM

## 2020-05-23 DIAGNOSIS — I69322 Dysarthria following cerebral infarction: Secondary | ICD-10-CM

## 2020-05-23 DIAGNOSIS — Z7901 Long term (current) use of anticoagulants: Secondary | ICD-10-CM

## 2020-05-23 DIAGNOSIS — Q8500 Neurofibromatosis, unspecified: Secondary | ICD-10-CM | POA: Diagnosis not present

## 2020-05-23 DIAGNOSIS — Z66 Do not resuscitate: Secondary | ICD-10-CM

## 2020-05-23 DIAGNOSIS — I69391 Dysphagia following cerebral infarction: Secondary | ICD-10-CM | POA: Diagnosis not present

## 2020-05-23 DIAGNOSIS — R680 Hypothermia, not associated with low environmental temperature: Secondary | ICD-10-CM | POA: Diagnosis present

## 2020-05-23 DIAGNOSIS — A419 Sepsis, unspecified organism: Secondary | ICD-10-CM | POA: Diagnosis present

## 2020-05-23 DIAGNOSIS — I69319 Unspecified symptoms and signs involving cognitive functions following cerebral infarction: Secondary | ICD-10-CM | POA: Diagnosis not present

## 2020-05-23 DIAGNOSIS — I69320 Aphasia following cerebral infarction: Secondary | ICD-10-CM | POA: Diagnosis not present

## 2020-05-23 DIAGNOSIS — F329 Major depressive disorder, single episode, unspecified: Secondary | ICD-10-CM | POA: Diagnosis present

## 2020-05-23 LAB — COMPREHENSIVE METABOLIC PANEL
ALT: 21 U/L (ref 0–44)
AST: 28 U/L (ref 15–41)
Albumin: 3 g/dL — ABNORMAL LOW (ref 3.5–5.0)
Alkaline Phosphatase: 122 U/L (ref 38–126)
Anion gap: 15 (ref 5–15)
BUN: 18 mg/dL (ref 8–23)
CO2: 25 mmol/L (ref 22–32)
Calcium: 8.2 mg/dL — ABNORMAL LOW (ref 8.9–10.3)
Chloride: 97 mmol/L — ABNORMAL LOW (ref 98–111)
Creatinine, Ser: 0.73 mg/dL (ref 0.44–1.00)
GFR calc Af Amer: >60 mL/min (ref >60)
GFR calc non Af Amer: >60 mL/min (ref >60)
Glucose, Bld: 120 mg/dL — ABNORMAL HIGH (ref 70–99)
Potassium: 3 mmol/L — ABNORMAL LOW (ref 3.5–5.1)
Sodium: 137 mmol/L (ref 135–145)
Total Bilirubin: 1.3 mg/dL — ABNORMAL HIGH (ref 0.3–1.2)
Total Protein: 7.1 g/dL (ref 6.5–8.1)

## 2020-05-23 LAB — URINALYSIS, ROUTINE W REFLEX MICROSCOPIC
Bilirubin Urine: NEGATIVE
Glucose, UA: NEGATIVE mg/dL
Hgb urine dipstick: NEGATIVE
Ketones, ur: NEGATIVE mg/dL
Leukocytes,Ua: NEGATIVE
Nitrite: NEGATIVE
Protein, ur: NEGATIVE mg/dL
Specific Gravity, Urine: 1.011 (ref 1.005–1.030)
pH: 6 (ref 5.0–8.0)

## 2020-05-23 LAB — SARS CORONAVIRUS 2 BY RT PCR (HOSPITAL ORDER, PERFORMED IN ~~LOC~~ HOSPITAL LAB): SARS Coronavirus 2: NEGATIVE

## 2020-05-23 LAB — CBC
HCT: 40.4 % (ref 36.0–46.0)
Hemoglobin: 13.8 g/dL (ref 12.0–15.0)
MCH: 35.4 pg — ABNORMAL HIGH (ref 26.0–34.0)
MCHC: 34.2 g/dL (ref 30.0–36.0)
MCV: 103.6 fL — ABNORMAL HIGH (ref 80.0–100.0)
Platelets: 317 10*3/uL (ref 150–400)
RBC: 3.9 MIL/uL (ref 3.87–5.11)
RDW: 15.7 % — ABNORMAL HIGH (ref 11.5–15.5)
WBC: 16.3 10*3/uL — ABNORMAL HIGH (ref 4.0–10.5)
nRBC: 0 % (ref 0.0–0.2)

## 2020-05-23 LAB — MAGNESIUM: Magnesium: 1.8 mg/dL (ref 1.7–2.4)

## 2020-05-23 LAB — LACTIC ACID, PLASMA: Lactic Acid, Venous: 2.2 mmol/L (ref 0.5–1.9)

## 2020-05-23 MED ORDER — FUROSEMIDE 40 MG PO TABS
40.0000 mg | ORAL_TABLET | Freq: Every day | ORAL | Status: DC
  Administered 2020-05-24 (×2): 40 mg via ORAL
  Filled 2020-05-23 (×2): qty 1

## 2020-05-23 MED ORDER — QUETIAPINE FUMARATE 25 MG PO TABS
12.5000 mg | ORAL_TABLET | Freq: Every day | ORAL | Status: DC
  Administered 2020-05-23: 12.5 mg via ORAL
  Filled 2020-05-23: qty 1

## 2020-05-23 MED ORDER — APIXABAN 2.5 MG PO TABS
2.5000 mg | ORAL_TABLET | Freq: Two times a day (BID) | ORAL | Status: DC
  Administered 2020-05-23 – 2020-05-25 (×5): 2.5 mg via ORAL
  Filled 2020-05-23 (×6): qty 1

## 2020-05-23 MED ORDER — FERROUS SULFATE 325 (65 FE) MG PO TABS
325.0000 mg | ORAL_TABLET | Freq: Every day | ORAL | Status: DC
  Administered 2020-05-24 – 2020-05-25 (×2): 325 mg via ORAL
  Filled 2020-05-23 (×3): qty 1

## 2020-05-23 MED ORDER — LATANOPROST 0.005 % OP SOLN
1.0000 [drp] | Freq: Every day | OPHTHALMIC | Status: DC
  Administered 2020-05-24 – 2020-05-25 (×2): 1 [drp] via OPHTHALMIC
  Filled 2020-05-23: qty 2.5

## 2020-05-23 MED ORDER — LACTATED RINGERS IV BOLUS (SEPSIS)
500.0000 mL | Freq: Once | INTRAVENOUS | Status: AC
  Administered 2020-05-24: 500 mL via INTRAVENOUS

## 2020-05-23 MED ORDER — ACETAMINOPHEN 325 MG PO TABS: 650.0000 mg | ORAL_TABLET | Freq: Four times a day (QID) | ORAL | Status: DC | PRN

## 2020-05-23 MED ORDER — TIMOLOL MALEATE 0.5 % OP SOLN
1.0000 [drp] | Freq: Two times a day (BID) | OPHTHALMIC | Status: DC
  Administered 2020-05-24 – 2020-05-26 (×5): 1 [drp] via OPHTHALMIC
  Filled 2020-05-23: qty 5

## 2020-05-23 MED ORDER — VANCOMYCIN HCL IN DEXTROSE 1-5 GM/200ML-% IV SOLN: 1000.0000 mg | Freq: Once | INTRAVENOUS | Status: DC

## 2020-05-23 MED ORDER — POLYETHYLENE GLYCOL 3350 17 G PO PACK: 17.0000 g | PACK | Freq: Every day | ORAL | Status: DC | PRN

## 2020-05-23 MED ORDER — ATENOLOL 25 MG PO TABS
25.0000 mg | ORAL_TABLET | Freq: Every day | ORAL | Status: DC
  Administered 2020-05-24 – 2020-05-25 (×2): 25 mg via ORAL
  Filled 2020-05-23 (×3): qty 1

## 2020-05-23 MED ORDER — METRONIDAZOLE IN NACL 5-0.79 MG/ML-% IV SOLN
500.0000 mg | Freq: Once | INTRAVENOUS | Status: DC
  Filled 2020-05-23: qty 100

## 2020-05-23 MED ORDER — ACETAMINOPHEN 650 MG RE SUPP: 650.0000 mg | Freq: Four times a day (QID) | RECTAL | Status: DC | PRN

## 2020-05-23 MED ORDER — SODIUM CHLORIDE 0.9 % IV BOLUS
1000.0000 mL | Freq: Once | INTRAVENOUS | Status: AC
  Administered 2020-05-23: 1000 mL via INTRAVENOUS

## 2020-05-23 MED ORDER — SODIUM CHLORIDE 0.9 % IV SOLN
2.0000 g | Freq: Once | INTRAVENOUS | Status: AC
  Administered 2020-05-23: 2 g via INTRAVENOUS
  Filled 2020-05-23: qty 2

## 2020-05-23 MED ORDER — POTASSIUM CHLORIDE CRYS ER 20 MEQ PO TBCR
40.0000 meq | EXTENDED_RELEASE_TABLET | Freq: Once | ORAL | Status: AC
  Administered 2020-05-23: 40 meq via ORAL
  Filled 2020-05-23: qty 2

## 2020-05-23 MED ORDER — SODIUM CHLORIDE 0.9 % IV SOLN
500.0000 mg | INTRAVENOUS | Status: DC
  Administered 2020-05-23 – 2020-05-25 (×3): 500 mg via INTRAVENOUS
  Filled 2020-05-23 (×4): qty 500

## 2020-05-23 MED ORDER — DORZOLAMIDE HCL 2 % OP SOLN
1.0000 [drp] | Freq: Two times a day (BID) | OPHTHALMIC | Status: DC
  Administered 2020-05-24 – 2020-05-26 (×5): 1 [drp] via OPHTHALMIC
  Filled 2020-05-23: qty 10

## 2020-05-23 MED ORDER — SODIUM CHLORIDE 0.9 % IV SOLN
2.0000 g | INTRAVENOUS | Status: DC
  Administered 2020-05-24 – 2020-05-26 (×3): 2 g via INTRAVENOUS
  Filled 2020-05-23 (×3): qty 2

## 2020-05-23 MED ORDER — ALBUTEROL SULFATE (2.5 MG/3ML) 0.083% IN NEBU: 2.5000 mg | INHALATION_SOLUTION | RESPIRATORY_TRACT | Status: DC | PRN

## 2020-05-23 MED ORDER — POTASSIUM CHLORIDE CRYS ER 20 MEQ PO TBCR: 40.0000 meq | EXTENDED_RELEASE_TABLET | Freq: Once | ORAL | Status: DC

## 2020-05-23 MED ORDER — SERTRALINE HCL 50 MG PO TABS
50.0000 mg | ORAL_TABLET | Freq: Every day | ORAL | Status: DC
  Administered 2020-05-23 – 2020-05-25 (×3): 50 mg via ORAL
  Filled 2020-05-23 (×4): qty 1

## 2020-05-23 NOTE — ED Provider Notes (Signed)
[facility]-EMERGENCY DEPT Provider Note   CSN: [id number] Arrival date & time: 05/16/2020  1207     History Chief Complaint  Patient presents with  . Altered Mental Status    Sarah Stone is a 86 y.o. female. Level 5 caveat HPI 86 yo female presents from SNF wit complaints of ams.  Per RN/EMS report family much did find the patient different from her baseline.  Reports they saw her yesterday and she was at baseline.  She is able to transfer and was awake and alert.  Today they report that she has eaten less easy to arouse and appears to be less responsive.    Patient's family is reported to be in route is not currently at the bedside Reviewed note from March of this year.  At that time, patient presented due to reports that she was paranoid and combative.  At that time she had been living at home.  She was seen and evaluated medically cleared.  She was seen and evaluated by psych and started on Zoloft 25 mg.   Past Medical History:  Diagnosis Date  . A-fib (HCC)   . Hypertension   . Stroke [facility])     Patient Active Problem List   Diagnosis Date Noted  . Small bowel obstruction (HCC) 02/04/2017  . Glaucoma of both eyes 02/18/2016  . Dysarthria due to cerebrovascular accident (CVA) 01/24/2016  . Dysphagia S/P CVA (cerebrovascular accident) 01/24/2016  . Cognitive deficit S/P CVA (cerebrovascular accident) 01/24/2016  . Ischemic stroke of frontal lobe (HCC)   . CVA (cerebral infarction) 01/19/2016  . Benign essential HTN 01/19/2016  . Chronic atrial fibrillation (HCC) 01/19/2016  . Difficulty speaking   . Lactic acidosis     Past Surgical History:  Procedure Laterality Date  . dnc    . INGUINAL HERNIA REPAIR Left 02/24/2015   Procedure: HERNIA REPAIR INGUINAL INCARCERATED;  Surgeon: Glenna Fellows, MD;  Location: [facility];  Service: General;  Laterality: Left;  With MESH     OB History   No obstetric history on file.     History reviewed. No  pertinent family history.  Social History   Tobacco Use  . Smoking status: Never Smoker  . Smokeless tobacco: Never Used  Substance Use Topics  . Alcohol use: No  . Drug use: No    Home Medications Prior to Admission medications   Medication Sig Start Date End Date Taking? Authorizing Provider  apixaban (ELIQUIS) 2.5 MG TABS tablet Take 1 tablet (2.5 mg total) by mouth 2 (two) times daily. 01/23/16   Ghimire, Werner Lean, MD  atenolol (TENORMIN) 25 MG tablet Take 25 mg by mouth daily.    [provider]  ferrous sulfate 325 (65 FE) MG tablet Take 325 mg by mouth daily with breakfast.    [provider]  furosemide (LASIX) 40 MG tablet Take 40 mg by mouth 2 (two) times daily.  04/30/17   [provider]  latanoprost (XALATAN) 0.005 % ophthalmic solution Place 1 drop into both eyes daily.  02/18/15   [provider]  losartan (COZAAR) 50 MG tablet Take 1 tablet by mouth daily. 05/13/17   [provider]  Multiple Vitamins-Minerals (PRESERVISION AREDS PO) Take 1 tablet by mouth in the morning and at bedtime.    [provider]  sertraline (ZOLOFT) 25 MG tablet Take 1 tablet (25 mg total) by mouth daily. 03/17/20   Sarah Dolly, FNP  timolol (BETIMOL) 0.5 % ophthalmic solution Place 1  drop into the right eye 2 (two) times daily.     [provider]    Allergies    Patient has no known allergies.  Review of Systems   Review of Systems  Unable to perform ROS: Other    Physical Exam Updated Vital Signs There were no vitals taken for this visit.  Physical Exam Vitals and nursing note reviewed.  Constitutional:      Appearance: Normal appearance.     Comments: Cachectic and appears age consistent  HENT:     Head: Normocephalic.     Right Ear: External ear normal.     Left Ear: External ear normal.     Nose: Nose normal.     Mouth/Throat:     Mouth: Mucous membranes are dry.  Cardiovascular:     Rate and Rhythm: Normal  rate.     Pulses: Normal pulses.  Pulmonary:     Effort: Pulmonary effort is normal.  Abdominal:     General: Abdomen is flat.     Palpations: Abdomen is soft.  Musculoskeletal:        General: Normal range of motion.  Skin:    General: Skin is warm and dry.     Capillary Refill: Capillary refill takes less than 2 seconds.  Neurological:     Mental Status: She is alert.     ED Results / Procedures / Treatments   Labs (all labs ordered are listed, but only abnormal results are displayed) Labs Reviewed  SARS CORONAVIRUS 2 BY RT PCR (HOSPITAL ORDER, [place] [facility])  CBC  COMPREHENSIVE METABOLIC PANEL  URINALYSIS, ROUTINE W REFLEX MICROSCOPIC    EKG None  Radiology CT Head Wo Contrast  Result Date: 05/07/2020 CLINICAL DATA:  Decreased responsiveness. EXAM: CT HEAD WITHOUT CONTRAST TECHNIQUE: Contiguous axial images were obtained from the base of the skull through the vertex without intravenous contrast. COMPARISON:  03/15/2020 FINDINGS: Brain: There is no evidence of acute infarct, intracranial hemorrhage, mass, midline shift, or extra-axial fluid collection. A small to moderate-sized chronic left MCA infarct is again seen involving the lateral frontal lobe, insula, and basal ganglia. A chronic lacunar infarct involving the posterior right basal ganglia/internal capsule is also unchanged. Hypodensities elsewhere in the cerebral white matter bilaterally are unchanged and nonspecific but compatible with moderate chronic small vessel ischemic disease. Cerebral atrophy is unchanged. Vascular: Calcified atherosclerosis at the skull base. No hyperdense vessel. Skull: No fracture or suspicious osseous lesion. Sinuses/Orbits: Visualized paranasal sinuses and mastoid air cells are clear. Bilateral cataract extraction. Other: None. IMPRESSION: 1. No evidence of acute intracranial abnormality. 2. Moderate chronic small vessel ischemic disease. Chronic left MCA infarct.  Electronically Signed   By: Logan Bores M.D.   On: 04/30/2020 14:16   DG Chest Portable 1 View  Result Date: 05/07/2020 CLINICAL DATA:  Decreased alertness. EXAM: PORTABLE CHEST 1 VIEW COMPARISON:  Chest radiograph 05/20/2017 FINDINGS: Monitoring leads overlie the patient. Patient is mildly rotated. Stable enlarged cardiac and mediastinal contours. No large area pulmonary consolidation. No pleural effusion or pneumothorax. Thoracic spine degenerative changes. IMPRESSION: Improved bilateral interstitial pulmonary opacities. Electronically Signed   By: Lovey Newcomer M.D.   On: 05/14/2020 14:29    Procedures .Critical Care Performed by: Pattricia Boss, MD Authorized by: Pattricia Boss, MD   Critical care provider statement:    Critical care time (minutes):  45   Critical care end time:  05/25/2020 5:03 PM   Critical care was time spent personally by me  on the following activities:  Discussions with consultants, evaluation of patient's response to treatment, examination of patient, ordering and performing treatments and interventions, ordering and review of laboratory studies, ordering and review of radiographic studies, pulse oximetry, re-evaluation of patient's condition, obtaining history from patient or surrogate and review of old charts   (including critical care time)  Medications Ordered in ED Medications - No data to display  ED Course  I have reviewed the triage vital signs and the nursing notes.  Pertinent labs & imaging results that were available during my care of the patient were reviewed by me and considered in my medical decision making (see chart for details).  Clinical Course as of May 23 1702  Wed 05-29-2020  1659 Elevated lactic acid noted, IV fluids and broad-spectrum antibiotics started   [DR]  1659 Hypokalemia noted and will order potassium repletion   Potassium(!): 3.0 [DR]  1659 Leukocytosis consistent with infection noted  WBC(!): 16.3 [DR]  1700 Covid test  negative   [DR]  1701 Chest x-Gurtej Noyola reviewed and noted to have improving   [DR]  1701 Interstitial opacities.   [DR]    Clinical Course User Index [DR] Margarita Grizzle, MD   MDM Rules/Calculators/A&P                      1:35 PM Discussed with Denny Peon on for palliative care  Work-up here reveals leukocytosis, elevated lactic acid, and hypothermia. IV fluids and broad-spectrum antibiotics ordered. Discussed with Dr. Henrene Hawking who will see for admission  1:35 PM Final Clinical Impression(s) / ED Diagnoses Final diagnoses:  Sepsis, due to unspecified organism, unspecified whether acute organ dysfunction present [facility])    Rx / DC Orders ED Discharge Orders    None       Margarita Grizzle, MD 05-29-20 [phone]

## 2020-05-23 NOTE — Progress Notes (Signed)
Pharmacy Note   A consult was received from an ED physician for vancomycin and cefepime  per pharmacy dosing.     The patient's profile has been reviewed for ht/wt/allergies/indication/available labs.   A one time order has been placed for  vancomycin 1000 mg IV x1 and cefepime 2 gr IV x1    Further antibiotics/pharmacy consults should be ordered by admitting physician if indicated.                       Thank you,   Adalberto Cole, PharmD, BCPS 05/06/2020 5:02 PM

## 2020-05-23 NOTE — ED Notes (Signed)
Date and time results received: Jun 19, 2020 4:27 PM  (use smartphrase ".now" to insert current time)  Test: Lactic Critical Value: 2.2  Name of Provider Notified: Ray  Orders Received? Or Actions Taken?: Orders Received - See Orders for details

## 2020-05-23 NOTE — ED Notes (Signed)
Phlebotomy at bedside.

## 2020-05-23 NOTE — ED Notes (Signed)
Warm blankets applied to patient.  

## 2020-05-23 NOTE — ED Notes (Signed)
Attempted to get two sets of cultures but was only able to obtain 1 with multiple attempts. One set sent down to lab, and antibiotics started in efforts not to delay pt care.

## 2020-05-23 NOTE — ED Triage Notes (Addendum)
Per EMS, patient from [facility], family saw patient yesterday and reports today patient has increased difficulty arousing compared to yesterday. Slower response to verbal stimuli. Hx stroke and dementia.  20g L FA

## 2020-05-23 NOTE — H&P (Signed)
History and Physical        Hospital Admission Note Date: May 26, 2020  Patient name: Sarah Stone Medical record number: [id number] Date of birth: 03-22-1918 Age: 86 y.o. Gender: female  PCP: Charlane Ferretti, DO  Patient coming from: [facility] Lives with: at SNF At baseline, ambulates: with wheelchair  Chief Complaint    Chief Complaint  Patient presents with  . Altered Mental Status      HPI:   This is a 86 year old female with a history of atrial fibrillation, CVA with residual aphasia, hypertension who presented from SNF with altered mental status since this AM and productive cough x 1 day.  History obtained from daughter at bedside as patient was unable to provide history currently.  She was reportedly doing well yesterday with the exception of a productive cough and this a.m. 1 family visited her at the SNF she was awake but not responsive very lethargic prompting ED visit.  ED labs and course: WBC 16.3, lactic acid 2.2, K3.0.  UA negative.  CT head without acute abnormality and with chronic left MCA infarct, CXR: Improved bilateral interstitial pulmonary opacities.  Given 1 L NS bolus and broad-spectrum antibiotics. Palliative care consulted from ED  ED vitals:  Vitals:   05/27/2020 1645 May 26, 2020 1800  BP: 135/66 139/87  Pulse:    Resp: 14 17  Temp:    SpO2:       Review of Systems:  Review of Systems  Unable to perform ROS: Dementia    Medical/Social/Family History   Past Medical History: Past Medical History:  Diagnosis Date  . A-fib (HCC)   . Hypertension   . Stroke [facility])     Past Surgical History:  Procedure Laterality Date  . dnc    . INGUINAL HERNIA REPAIR Left 02/24/2015   Procedure: HERNIA REPAIR INGUINAL INCARCERATED;  Surgeon: Glenna Fellows, MD;  Location: [facility]S;  Service: General;  Laterality: Left;  With MESH     Medications: Prior to Admission medications   Medication Sig Start Date End Date Taking? Authorizing Provider  apixaban (ELIQUIS) 2.5 MG TABS tablet Take 1 tablet (2.5 mg total) by mouth 2 (two) times daily. 01/23/16  Yes Ghimire, Werner Lean, MD  atenolol (TENORMIN) 25 MG tablet Take 25 mg by mouth daily.   Yes [provider]  dorzolamide (TRUSOPT) 2 % ophthalmic solution Place 1 drop into the right eye 2 (two) times daily.   Yes [provider]  ferrous sulfate 325 (65 FE) MG tablet Take 325 mg by mouth daily with breakfast.   Yes [provider]  furosemide (LASIX) 40 MG tablet Take 40 mg by mouth daily.  04/30/17  Yes [provider]  latanoprost (XALATAN) 0.005 % ophthalmic solution Place 1 drop into both eyes at bedtime.  02/18/15  Yes [provider]  QUEtiapine (SEROQUEL) 25 MG tablet Take 12.5 mg by mouth at bedtime. 05/15/20  Yes [provider]  sertraline (ZOLOFT) 50 MG tablet Take 50 mg by mouth daily. 03/21/20  Yes [provider]  timolol (TIMOPTIC) 0.5 % ophthalmic solution Place 1 drop into both eyes 2 (two) times daily. 05/12/20  Yes [provider]  sertraline (ZOLOFT) 25  MG tablet Take 1 tablet (25 mg total) by mouth daily. Patient not taking: Reported on 06-13-20 03/17/20   Emmaline Kluver, FNP    Allergies:  No Known Allergies  Social History:  reports that she has never smoked. She has never used smokeless tobacco. She reports that she does not drink alcohol or use drugs.  Family History: History reviewed. No pertinent family history.   Objective   Physical Exam: Blood pressure 139/87, pulse (!) 57, temperature (!) 96.7 F (35.9 C), temperature source Rectal, resp. rate 17, SpO2 93 %.  Physical Exam Vitals and nursing note reviewed. Exam conducted with a chaperone present.  Constitutional:      General: She is not in acute distress.    Appearance: She is not toxic-appearing.     Comments:  Frail elderly female  Eyes:     Conjunctiva/sclera: Conjunctivae normal.  Cardiovascular:     Rate and Rhythm: Normal rate. Rhythm irregular.  Pulmonary:     Effort: No respiratory distress.     Breath sounds: Normal breath sounds. No wheezing or rales.     Comments: Productive cough during exam Abdominal:     General: There is no distension.     Tenderness: There is no abdominal tenderness.  Musculoskeletal:        General: No swelling or tenderness.     Comments: Right heel stage I pressure ulcer (see picture)  Skin:    Coloration: Skin is not jaundiced.     Comments: Neurofibromatosis diffusely  Neurological:     Mental Status: She is alert.     Comments: Patient has aphasia at baseline unable to obtain orientation but she is awake and follows commands  Psychiatric:        Behavior: Behavior normal.       LABS on Admission: I have personally reviewed all the labs and imaging below    Basic Metabolic Panel: Recent Labs  Lab 06-13-20 1222  NA 137  K 3.0*  CL 97*  CO2 25  GLUCOSE 120*  BUN 18  CREATININE 0.73  CALCIUM 8.2*  MG 1.8   Liver Function Tests: Recent Labs  Lab Jun 13, 2020 1222  AST 28  ALT 21  ALKPHOS 122  BILITOT 1.3*  PROT 7.1  ALBUMIN 3.0*   No results for input(s): LIPASE, AMYLASE in the last 168 hours. No results for input(s): AMMONIA in the last 168 hours. CBC: Recent Labs  Lab 13-Jun-2020 1222  WBC 16.3*  HGB 13.8  HCT 40.4  MCV 103.6*  PLT 317   Cardiac Enzymes: No results for input(s): CKTOTAL, CKMB, CKMBINDEX, TROPONINI in the last 168 hours. BNP: Invalid input(s): POCBNP CBG: No results for input(s): GLUCAP in the last 168 hours.  Radiological Exams on Admission:  CT Head Wo Contrast  Result Date: 06-13-20 CLINICAL DATA:  Decreased responsiveness. EXAM: CT HEAD WITHOUT CONTRAST TECHNIQUE: Contiguous axial images were obtained from the base of the skull through the vertex without intravenous contrast. COMPARISON:   03/15/2020 FINDINGS: Brain: There is no evidence of acute infarct, intracranial hemorrhage, mass, midline shift, or extra-axial fluid collection. A small to moderate-sized chronic left MCA infarct is again seen involving the lateral frontal lobe, insula, and basal ganglia. A chronic lacunar infarct involving the posterior right basal ganglia/internal capsule is also unchanged. Hypodensities elsewhere in the cerebral white matter bilaterally are unchanged and nonspecific but compatible with moderate chronic small vessel ischemic disease. Cerebral atrophy is unchanged. Vascular: Calcified atherosclerosis at the skull base. No hyperdense vessel.  Skull: No fracture or suspicious osseous lesion. Sinuses/Orbits: Visualized paranasal sinuses and mastoid air cells are clear. Bilateral cataract extraction. Other: None. IMPRESSION: 1. No evidence of acute intracranial abnormality. 2. Moderate chronic small vessel ischemic disease. Chronic left MCA infarct. Electronically Signed   By: Sebastian Ache M.D.   On: 2020/06/12 14:16   DG Chest Portable 1 View  Result Date: 06/12/2020 CLINICAL DATA:  Decreased alertness. EXAM: PORTABLE CHEST 1 VIEW COMPARISON:  Chest radiograph 05/20/2017 FINDINGS: Monitoring leads overlie the patient. Patient is mildly rotated. Stable enlarged cardiac and mediastinal contours. No large area pulmonary consolidation. No pleural effusion or pneumothorax. Thoracic spine degenerative changes. IMPRESSION: Improved bilateral interstitial pulmonary opacities. Electronically Signed   By: Annia Belt M.D.   On: 06-12-20 14:29      EKG: Not done in ED   A & P   Active Problems:   Sepsis (HCC)   1. Acute metabolic encephalopathy secondary to Sepsis, likely secondary to CAP a. WBC 16, afebrile, lactic acid elevated at 2.2.  Covid negative b. Received 1 L NS bolus in ED will give additional 500 cc LR bolus which will be abount 30 cc/kg. Hold lasix today c. CXR unremarkable although she does  have a new productive cough during exam d. Treated with ceftriaxone and azithromycin.  Will check MRSA nares and add vancomycin if positive e. Repeat lactic acid level f. Follow-up blood cultures x2 g. Palliative care consult  2. Failure to thrive, multifactorial: sepsis, dementia a. Palliative b. Nutrition consult  3. Atrial fibrillation, rate controlled a. Continue home beta-blocker b. Continue Eliquis c. Telemetry  4. Right heel stage I pressure ulcer, POA a. Wound care  5. Depression a. Continue SSRI  6. Dementia with history of sundowning a. Continue Seroquel nightly b. Palliative care consulted c. Delirium precautions  7. Hypokalemia a. Replete PO  8. History of CVA with residual aphasia, appears at baseline  9. History of diastolic heart failure a. Continue Lasix tomorrow  10. History of iron deficiency anemia on supplement   DVT prophylaxis: eliquis   Code Status: DNR  Diet: heart healthy Family Communication: Admission, patients condition and plan of care including tests being ordered have been discussed with the patient who indicates understanding and agrees with the plan and Code Status. Patient's daughter was updated  Disposition Plan: The appropriate patient status for this patient is INPATIENT. Inpatient status is judged to be reasonable and necessary in order to provide the required intensity of service to ensure the patient's safety. The patient's presenting symptoms, physical exam findings, and initial radiographic and laboratory data in the context of their chronic comorbidities is felt to place them at high risk for further clinical deterioration. Furthermore, it is not anticipated that the patient will be medically stable for discharge from the hospital within 2 midnights of admission. The following factors support the patient status of inpatient.   " The patient's presenting symptoms include cough, encephalopathy. " The worrisome physical exam findings  include productive cough, right heel pressure ulcer. " The initial radiographic and laboratory data are worrisome because of leukocytosis, lactic acidosis. " The chronic co-morbidities include dementia, CVA, afib.   * I certify that at the point of admission it is my clinical judgment that the patient will require inpatient hospital care spanning beyond 2 midnights from the point of admission due to high intensity of service, high risk for further deterioration and high frequency of surveillance required.*   Status is: Inpatient  Remains inpatient appropriate because:Unsafe  d/c plan and IV treatments appropriate due to intensity of illness or inability to take PO   Dispo: The patient is from: SNF              Anticipated d/c is to: SNF              Anticipated d/c date is: 2 days              Patient currently is not medically stable to d/c.     Consultants  . palliative  Procedures  . none  Time Spent on Admission: 66 minutes    Jae Dire, DO [facility] Pager [phone] 15-Jun-2020, 6:20 PM

## 2020-05-23 NOTE — ED Notes (Signed)
Unsuccessful in and out cath attempt x1. Patient placed on purewick.

## 2020-05-24 ENCOUNTER — Other Ambulatory Visit: Payer: Self-pay

## 2020-05-24 DIAGNOSIS — A419 Sepsis, unspecified organism: Principal | ICD-10-CM

## 2020-05-24 DIAGNOSIS — Z66 Do not resuscitate: Secondary | ICD-10-CM

## 2020-05-24 DIAGNOSIS — Z515 Encounter for palliative care: Secondary | ICD-10-CM

## 2020-05-24 DIAGNOSIS — Z7189 Other specified counseling: Secondary | ICD-10-CM

## 2020-05-24 LAB — BASIC METABOLIC PANEL
Anion gap: 13 (ref 5–15)
BUN: 19 mg/dL (ref 8–23)
CO2: 24 mmol/L (ref 22–32)
Calcium: 7.7 mg/dL — ABNORMAL LOW (ref 8.9–10.3)
Chloride: 102 mmol/L (ref 98–111)
Creatinine, Ser: 0.59 mg/dL (ref 0.44–1.00)
GFR calc Af Amer: >60 mL/min (ref >60)
GFR calc non Af Amer: >60 mL/min (ref >60)
Glucose, Bld: 106 mg/dL — ABNORMAL HIGH (ref 70–99)
Potassium: 2.8 mmol/L — ABNORMAL LOW (ref 3.5–5.1)
Sodium: 139 mmol/L (ref 135–145)

## 2020-05-24 LAB — CBC
HCT: 31.6 % — ABNORMAL LOW (ref 36.0–46.0)
Hemoglobin: 10.5 g/dL — ABNORMAL LOW (ref 12.0–15.0)
MCH: 33.9 pg (ref 26.0–34.0)
MCHC: 33.2 g/dL (ref 30.0–36.0)
MCV: 101.9 fL — ABNORMAL HIGH (ref 80.0–100.0)
Platelets: 306 10*3/uL (ref 150–400)
RBC: 3.1 MIL/uL — ABNORMAL LOW (ref 3.87–5.11)
RDW: 14.6 % (ref 11.5–15.5)
WBC: 15.2 10*3/uL — ABNORMAL HIGH (ref 4.0–10.5)
nRBC: 0 % (ref 0.0–0.2)

## 2020-05-24 LAB — MRSA PCR SCREENING: MRSA by PCR: NEGATIVE

## 2020-05-24 LAB — CORTISOL-AM, BLOOD: Cortisol - AM: 24.1 ug/dL — ABNORMAL HIGH (ref 6.7–22.6)

## 2020-05-24 LAB — PROTIME-INR
INR: 2.5 — ABNORMAL HIGH (ref 0.8–1.2)
Prothrombin Time: 26.5 seconds — ABNORMAL HIGH (ref 11.4–15.2)

## 2020-05-24 LAB — LACTIC ACID, PLASMA: Lactic Acid, Venous: 1.7 mmol/L (ref 0.5–1.9)

## 2020-05-24 LAB — PROCALCITONIN: Procalcitonin: 0.26 ng/mL

## 2020-05-24 LAB — MAGNESIUM: Magnesium: 1.6 mg/dL — ABNORMAL LOW (ref 1.7–2.4)

## 2020-05-24 LAB — TSH: TSH: 5.277 u[IU]/mL — ABNORMAL HIGH (ref 0.350–4.500)

## 2020-05-24 MED ORDER — LACTATED RINGERS IV SOLN: INTRAVENOUS | Status: DC

## 2020-05-24 MED ORDER — QUETIAPINE FUMARATE 25 MG PO TABS
25.0000 mg | ORAL_TABLET | Freq: Every day | ORAL | Status: DC
  Administered 2020-05-24: 25 mg via ORAL
  Filled 2020-05-24 (×3): qty 1

## 2020-05-24 MED ORDER — POTASSIUM CHLORIDE 10 MEQ/100ML IV SOLN
10.0000 meq | INTRAVENOUS | Status: AC
  Administered 2020-05-24 (×2): 10 meq via INTRAVENOUS
  Filled 2020-05-24 (×2): qty 100

## 2020-05-24 MED ORDER — MAGNESIUM SULFATE IN D5W 1-5 GM/100ML-% IV SOLN
1.0000 g | Freq: Once | INTRAVENOUS | Status: AC
  Administered 2020-05-24: 1 g via INTRAVENOUS
  Filled 2020-05-24: qty 100

## 2020-05-24 MED ORDER — ORAL CARE MOUTH RINSE
15.0000 mL | Freq: Two times a day (BID) | OROMUCOSAL | Status: DC
  Administered 2020-05-24 – 2020-05-26 (×7): 15 mL via OROMUCOSAL

## 2020-05-24 MED ORDER — SODIUM CHLORIDE 0.9% FLUSH: 3.0000 mL | Freq: Two times a day (BID) | INTRAVENOUS | Status: DC

## 2020-05-24 MED ORDER — POTASSIUM CHLORIDE CRYS ER 20 MEQ PO TBCR
40.0000 meq | EXTENDED_RELEASE_TABLET | Freq: Once | ORAL | Status: AC
  Administered 2020-05-24: 40 meq via ORAL
  Filled 2020-05-24: qty 2

## 2020-05-24 NOTE — Evaluation (Signed)
SLP Cancellation Note  Patient Details Name: Sarah Stone MRN: [id number] DOB: 11/13/1918   Cancelled treatment:       Reason Eval/Treat Not Completed: Fatigue/lethargy limiting ability to participate(RN reports pt is lethargic at this time but advised will contact SLP when pt awakens adequately for po/eval.  Thanks.)   Chales Abrahams 05/24/2020, 9:29 AM   Rolena Infante, MS [facility] SLP Acute Rehab Services Office [phone]

## 2020-05-24 NOTE — Evaluation (Signed)
Physical Therapy Evaluation Patient Details Name: Sarah Stone MRN: [id number] DOB: 1918/03/26 Today's Date: 05/24/2020   History of Present Illness  86 y.o. female  with past medical history of stroke, hypertension, a.fib, and dementia admitted on 05/01/2020 with sepsis secondary to CAP, acute encephalopathy, and failure to thrive.  Clinical Impression  Pt admitted with above diagnosis.  Pt currently with functional limitations due to the deficits listed below (see PT Problem List). Pt will benefit from skilled PT to increase their independence and safety with mobility to allow discharge to the venue listed below.  Pt recently moved to ALF and was actively receiving HHPT.  Pt typically using w/c for mobility at facility for safety however was ambulatory prior to April 2021.  Daughter agreeable to assist pt as able with progressing mobility.   Follow Up Recommendations Supervision/Assistance - 24 hour;Home health PT(dependent on plans for hospice, pt was actively receiving PT at ALF per daughter)    Equipment Recommendations  None recommended by PT    Recommendations for Other Services       Precautions / Restrictions Precautions Precautions: Fall Precaution Comments: expressive aphasia, HOH      Mobility  Bed Mobility Overal bed mobility: Needs Assistance Bed Mobility: Supine to Sit;Sit to Supine     Supine to sit: Max assist Sit to supine: Mod assist   General bed mobility comments: verbal cues for technique, assist for upper and lower body to EOB, assist for lower body onto bed for return to supine  Transfers                 General transfer comment: pt declined; daughter states pt has not performed standing since Monday  Ambulation/Gait                Stairs            Wheelchair Mobility    Modified Rankin (Stroke Patients Only)       Balance Overall balance assessment: Needs assistance Sitting-balance support: No upper extremity  supported;Feet supported Sitting balance-Leahy Scale: Fair Sitting balance - Comments: able to sit EOB for approx 4 minutes                                     Pertinent Vitals/Pain Pain Assessment: Faces Faces Pain Scale: No hurt Pain Intervention(s): Monitored during session;Repositioned    Home Living Family/patient expects to be discharged to:: Assisted living               Home Equipment: Walker - 2 wheels;Wheelchair - manual Additional Comments: pt moved to ALF April 20th    Prior Function Level of Independence: Independent with assistive device(s)         Comments: pt was ambulatory (should have been using RW per daughter) prior to going to ALF, ALF has had pt using w/c for safety     Hand Dominance        Extremity/Trunk Assessment        Lower Extremity Assessment Lower Extremity Assessment: Generalized weakness    Cervical / Trunk Assessment Cervical / Trunk Assessment: Kyphotic  Communication   Communication: Expressive difficulties;HOH  Cognition Arousal/Alertness: Awake/alert Behavior During Therapy: WFL for tasks assessed/performed Overall Cognitive Status: Difficult to assess  General Comments: expressive aphasia making communication difficult however daughter reports pt sleeping more over past month as well as attempts to express her needs but states the wrong words      General Comments      Exercises     Assessment/Plan    PT Assessment Patient needs continued PT services  PT Problem List Decreased balance;Decreased knowledge of use of DME;Decreased activity tolerance;Decreased strength;Decreased mobility       PT Treatment Interventions Gait training;DME instruction;Therapeutic activities;Patient/family education;Therapeutic exercise;Wheelchair mobility training;Functional mobility training;Balance training    PT Goals (Current goals can be found in the Care Plan  section)  Acute Rehab PT Goals Patient Stated Goal: pt would like to return home PT Goal Formulation: With patient/family Time For Goal Achievement: 06/07/20 Potential to Achieve Goals: Fair    Frequency Min 2X/week   Barriers to discharge        Co-evaluation               AM-PAC PT "6 Clicks" Mobility  Outcome Measure Help needed turning from your back to your side while in a flat bed without using bedrails?: A Lot Help needed moving from lying on your back to sitting on the side of a flat bed without using bedrails?: A Lot Help needed moving to and from a bed to a chair (including a wheelchair)?: A Lot Help needed standing up from a chair using your arms (e.g., wheelchair or bedside chair)?: Total Help needed to walk in hospital room?: Total Help needed climbing 3-5 steps with a railing? : Total 6 Click Score: 9    End of Session   Activity Tolerance: Patient tolerated treatment well Patient left: in bed;with call bell/phone within reach;with bed alarm set;with family/visitor present Nurse Communication: Mobility status PT Visit Diagnosis: Other abnormalities of gait and mobility (R26.89);Muscle weakness (generalized) (M62.81)    Time: 3546-5681 PT Time Calculation (min) (ACUTE ONLY): 18 min   Charges:   PT Evaluation $PT Eval Low Complexity: 1 Low        Kati PT, DPT Acute Rehabilitation Services Office: [phone]   Sarajane Jews 05/24/2020, 3:38 PM

## 2020-05-24 NOTE — TOC Initial Note (Signed)
Transition of Care [facility]) - Initial/Assessment Note    Patient Details  Name: Sarah Stone MRN: [id number] Date of Birth: 05-04-1918  [facility] CM/SW Contact:    Lanier Clam, RN Phone Number: 05/24/2020, 3:43 PM  Clinical Narrative: From  [facility]-patient w/c bound, unable to understand  Communication. [facility] to provide services @ [facility]-dtr sandra agree with d/c plan.fl2 faxed w/confirmation.DNR signed in shadow chart. covid needed 48hrs of d/c.                  Expected Discharge Plan: Assisted Living Barriers to Discharge: Continued Medical Work up   Patient Goals and CMS Choice Patient states their goals for this hospitalization and ongoing recovery are:: [facility] back to [facility] w/Hospice services. CMS Medicare.gov Compare Post Acute Care list provided to:: Patient Represenative (must comment) Choice offered to / list presented to : [facility] POA / Guardian  Expected Discharge Plan and Services Expected Discharge Plan: Assisted Living   Discharge Planning Services: CM Consult Post Acute Care Choice: Hospice(ALF-memory care)                                        Prior Living Arrangements/Services   Lives with:: Facility Resident Patient language and need for interpreter reviewed:: Yes Do you feel safe going back to the place where you live?: Yes      Need for Family Participation in Patient Care: No (Comment) Care giver support system in place?: Yes (comment)   Criminal Activity/Legal Involvement Pertinent to Current Situation/Hospitalization: No - Comment as needed  Activities of Daily Living Home Assistive Devices/Equipment: Wheelchair, Environmental consultant (specify type), Dentures (specify type)(lower partial plate) ADL Screening (condition at time of admission) Patient's cognitive ability adequate to safely complete daily activities?: No Is the patient deaf or have difficulty hearing?:  Yes(hearing aide in right ear) Does the patient have difficulty seeing, even when wearing glasses/contacts?: Yes(glaucoma in both eyes) Does the patient have difficulty concentrating, remembering, or making decisions?: Yes Patient able to express need for assistance with ADLs?: No Does the patient have difficulty dressing or bathing?: Yes Independently performs ADLs?: No Communication: Independent Dressing (OT): Needs assistance Is this a change from baseline?: Pre-admission baseline Grooming: Needs assistance Is this a change from baseline?: Pre-admission baseline Feeding: Needs assistance Is this a change from baseline?: Pre-admission baseline Bathing: Needs assistance Is this a change from baseline?: Pre-admission baseline Toileting: Needs assistance Is this a change from baseline?: Pre-admission baseline In/Out Bed: Needs assistance Is this a change from baseline?: Pre-admission baseline Walks in Home: Dependent Is this a change from baseline?: Pre-admission baseline Does the patient have difficulty walking or climbing stairs?: Yes Weakness of Legs: Both Weakness of Arms/Hands: Both  Permission Sought/Granted Permission sought to share information with : Case Manager Permission granted to share information with : Yes, Verbal Permission Granted  Share Information with NAME: Case manager  Permission granted to share info w AGENCY: [facility]     Permission granted to share info w Contact Information: [name] [phone]  Emotional Assessment Appearance:: Appears stated age Attitude/Demeanor/Rapport: Gracious Affect (typically observed): Accepting Orientation: : (Communicates-Non sensical) Alcohol / Substance Use: Not Applicable Psych Involvement: No (comment)  Admission diagnosis:  Sepsis (HCC) [A41.9] Sepsis, due to unspecified organism, unspecified whether acute organ dysfunction present [facility]) [A41.9] Patient Active Problem List   Diagnosis Date Noted  .  Goals of care, counseling/discussion   . Palliative care by specialist   . DNR (do not resuscitate)   . Sepsis ([place]) 05/27/2020  . Small bowel obstruction ([place]) 02/04/2017  . Glaucoma of both eyes 02/18/2016  . Dysarthria due to cerebrovascular accident (CVA) 01/24/2016  . Dysphagia S/P CVA (cerebrovascular accident) 01/24/2016  . Cognitive deficit S/P CVA (cerebrovascular accident) 01/24/2016  . Ischemic stroke of frontal lobe ([place])   . CVA (cerebral infarction) 01/19/2016  . Benign essential HTN 01/19/2016  . Chronic atrial fibrillation ([place]) 01/19/2016  . Difficulty speaking   . Lactic acidosis    PCP:  Sueanne Margarita, DO Pharmacy:   [facility] [address] [place], [place] [address] Phone: [phone] Fax: [place], [address] AT [address] [place] [address] Phone: [phone] Fax: [phone]     Social Determinants of Health (SDOH) Interventions    Readmission Risk Interventions No flowsheet data found.

## 2020-05-24 NOTE — ED Notes (Signed)
Report called for pt transfer to the floor  

## 2020-05-24 NOTE — Progress Notes (Signed)
Initial Nutrition Assessment  RD working remotely.   DOCUMENTATION CODES:   Underweight  INTERVENTION:  - will order Magic Cup TID with meals, each supplement provides 290 kcal and 9 grams of protein. - will perform NFPE at follow-up.   NUTRITION DIAGNOSIS:   Inadequate oral intake related to acute illness, lethargy/confusion as evidenced by per patient/family report.  GOAL:   Patient will meet greater than or equal to 90% of their needs  MONITOR:   PO intake, Labs, Weight trends, Skin  REASON FOR ASSESSMENT:   Malnutrition Screening Tool, Consult Assessment of nutrition requirement/status  ASSESSMENT:   86 year old female medical history of afib, CVA with residual aphasia, and HTN. She presented from SNF with AMS and productive cough x1 day.  Patient noted to be disoriented x4. SLP note from a few minutes ago reviewed. Note states moderate aspiration risk and that patient may do best with items such as pudding and ice cream.   Patient has not been seen by a [place] RD since 02/25/2015. Weight today is 89 lb and weight on 03/16/20 was 100 lb. This indicates 11 lb weight loss (11% body weight) in the past 2 months. Significant for time frame. Unable to state malnutrition without completing NPE, but patient very likely meets criteria for severe malnutrition.  Patient seen by Palliative Care earlier today. Patient is DNR and daughter is interested in patient returning to SNF. Palliative Care note states prognosis is likely <6 months.   Per notes: - acute metabolic encephalopathy 2/2 sepsis - FTT - hx of dementia   Labs reviewed; K: 2.8 mmol/l, Ca: 7.7 mg/dl, Mg: 1.6 mg/dl, Phos not checked. Medications reviewed; 325 mg ferrous sulfate/day, 40 mg oral lasix/day, 1 g IV Mg sulfate x1 run 5/27, 10 mEq IV KCl x2 runs 5/27, 40 mEq Klor-Con x1 dose 5/26 and x1 dose 5/27.    NUTRITION - FOCUSED PHYSICAL EXAM:  unable to complete at this time.  Diet Order:   Diet Order             Diet Heart Room service appropriate? Yes; Fluid consistency: Thin  Diet effective now              EDUCATION NEEDS:   No education needs have been identified at this time  Skin:  Skin Assessment: Skin Integrity Issues: Skin Integrity Issues:: DTI DTI: R heel  Last BM:  PTA/unknown  Height:   Ht Readings from Last 1 Encounters:  03/16/20 5\' 1"  (1.549 m)    Weight:   Wt Readings from Last 1 Encounters:  05/24/20 40.3 kg    Estimated Nutritional Needs:  Kcal:  1000-1200 kcal Protein:  45-55 grams Fluid:  >/= 1.4 L/day     05/11/2020, MS, RD, LDN, CNSC Inpatient Clinical Dietitian RD pager # available in AMION  After hours/weekend pager # available in [facility]

## 2020-05-24 NOTE — Evaluation (Signed)
Clinical/Bedside Swallow Evaluation Patient Details  Name: Sarah Stone MRN: [id number] Date of Birth: 03-05-1918  Today's Date: 05/24/2020 Time: SLP Start Time (ACUTE ONLY): 1250 SLP Stop Time (ACUTE ONLY): 1315 SLP Time Calculation (min) (ACUTE ONLY): 25 min  Past Medical History:  Past Medical History:  Diagnosis Date  . A-fib (HCC)   . Hypertension   . Stroke [facility])    Past Surgical History:  Past Surgical History:  Procedure Laterality Date  . dnc    . INGUINAL HERNIA REPAIR Left 02/24/2015   Procedure: HERNIA REPAIR INGUINAL INCARCERATED;  Surgeon: Glenna Fellows, MD;  Location: [facility] ORS;  Service: General;  Laterality: Left;  With MESH   HPI:  86  yo female adm to [facility] with AMS, found to have pna.  Pt PMH + for CVA with resultant aphasia, progressive cognitive decline. She stays at [facility] and her daughter reports recent poor intake of foods and minimal intake of liquids. Pt had been feeding herself but daughter states recently she has been being fed as she does not want po. Pt has been seen by palliative group and hospice will follow up via phone. Swallow eval ordered. Daughter has a master's degree in nutrition and her spouse conducts studies re dementia and exercise and thus they are very aware of dementia/dysphagia.  Daughter advises she has allowed pt to have what she wants to eat.  Pt's voice is hoarse, she does not allow oral care per daughter and her neck is hyperextended in bed.  SLP repositioned her for optimal positioning for po intake.   Assessment / Plan / Recommendation Clinical Impression  Patient's daughter Sarah Stone present during session.  Pt xerostomic - but allowed SLP to brush her teeth for maximal comfort.  Voice is hoarse and pt repeats "thank you" to this SLP. She did not follow directions = suspect due to both aphasia and progressive cognitive decline. She accepted only a minimal intake of icecream, juice with hand over hand assist.  Swallow was very  delayed if she elicited a swallow at all.     Laryngeal elevation appreciated visually however after minimal intake, pt's breathing appeared mildly congested which may indicate laryngeal retention of secretions and/or boluses.  Pt did NOT follow directions to swallow or cough and became sleepy - closing eyes during session.  Daughter advises her attention waxes/wanes.    Delayed cough noted with HOB lowered slightly to improve pt's comfort.     Daughter Sarah Stone in agreement with comfort diet = Educated her to dysphagia with cognitive deficits, possible dietary modifications that may be helpful to maximize comfort.  Given pt's lack of intake recently and dysphagia, she will likely not meet nutritional and hydration needs.  All education completed = No SlP follow up indicated. Thanks.   SLP Visit Diagnosis: Dysphagia, oropharyngeal phase (R13.12)    Aspiration Risk  Moderate aspiration risk;Risk for inadequate nutrition/hydration    Diet Recommendation Thin liquid(diet as tolerated, puddings, icecream, etc may be tolerated best)   Liquid Administration via: Cup;Straw;Tsp Medication Administration: Crushed with puree Supervision: Comment(hand over hand assist, feed only when fully alert and accepting) Compensations: Small sips/bites;Slow rate;Other (Comment)(oral suction set up to use prn, feed pt only when fully alert, assure pt swallows before giving more) Postural Changes: Remain upright for at least 30 minutes after po intake;Seated upright at 90 degrees    Other  Recommendations Oral Care Recommendations: Oral care before and after PO Other Recommendations: Have oral suction available   Follow up Recommendations  None      Frequency and Duration   n/a         Prognosis   n/a     Swallow Study   General Date of Onset: 05/24/20 HPI: 86  yo female adm to [facility] with AMS, found to have pna.  Pt PMH + for CVA with resultant aphasia, progressive cognitive decline. She stays at  [facility] and her daughter reports recent poor intake of foods and minimal intake of liquids. Pt had been feeding herself but daughter states recently she has been being fed as she does not want po. Pt has been seen by palliative group and hospice will follow up via phone. Swallow eval ordered. Daughter has a master's degree in nutrition and her spouse conducts studies re dementia and exercise and thus they are very aware of dementia/dysphagia.  Daughter advises she has allowed pt to have what she wants to eat.  Pt's voice is hoarse, she does not allow oral care per daughter and her neck is hyperextended in bed.  SLP repositioned her for optimal positioning for po intake. Type of Study: Bedside Swallow Evaluation Previous Swallow Assessment: none Diet Prior to this Study: Regular;Thin liquids Temperature Spikes Noted: No Respiratory Status: Room air History of Recent Intubation: No Behavior/Cognition: Lethargic/Drowsy(become sleepy easily during evaluation) Oral Cavity Assessment: Dried secretions;Dry Oral Care Completed by SLP: Other (Comment)(yes with toothbrush and paste, water) Oral Cavity - Dentition: Adequate natural dentition Self-Feeding Abilities: Needs assist(hand over hand assist provided to pt) Patient Positioning: Other (comment)(positioned upright in bed with head in neutral position) Baseline Vocal Quality: Hoarse Volitional Cough: Cognitively unable to elicit Volitional Swallow: Unable to elicit    Oral/Motor/Sensory Function Overall Oral Motor/Sensory Function: (pt did not follow directions for oral motor exam, able to seal lips on spoon, did not open mouth to view adequately)   Ice Chips Ice chips: Not tested   Thin Liquid Thin Liquid: Impaired Presentation: Spoon Oral Phase Impairments: Poor awareness of bolus;Reduced lingual movement/coordination Oral Phase Functional Implications: Prolonged oral transit;Oral holding(clinically appeared with prolonged oral transiting,  decreased awareness) Pharyngeal  Phase Impairments: Suspected delayed Swallow    Nectar Thick Nectar Thick Liquid: Impaired Presentation: Cup;Self Fed;Straw Oral Phase Impairments: Poor awareness of bolus;Reduced lingual movement/coordination Oral phase functional implications: Oral holding Pharyngeal Phase Impairments: Suspected delayed Swallow   Honey Thick Honey Thick Liquid: Not tested   Puree Puree: Impaired Presentation: Spoon Oral Phase Impairments: Reduced labial seal;Reduced lingual movement/coordination;Poor awareness of bolus Oral Phase Functional Implications: Oral holding Pharyngeal Phase Impairments: Suspected delayed Swallow;Cough - Delayed Other Comments: icecream   Solid     Solid: Not tested      Macario Golds 05/24/2020,2:56 PM    Sarah Lime, MS [place] Office [phone]

## 2020-05-24 NOTE — Progress Notes (Addendum)
Patent examiner [facility]) Hospital Liaison: RN note     Notified by Transition of Care Manger Lanier Clam, RN of patient/family request for [facility] services at [facility] after discharge. Chart and patient information reviewed by [facility] physician. Hospice eligibility confirmed.    Writer spoke with daughter, Dois Davenport to initiate education related to hospice philosophy, services and team approach to care. Dois Davenport  verbalized understanding of information given.   Please send signed and completed DNR form home with patient/family. Patient will need prescriptions for discharge comfort medications.      DME needs have been discussed, patient currently has the following equipment in the home: none.  Patient/family requests the following DME for delivery to the home: Hospital bed, 3N1 and W/C. [facility] equipment manager has been notified and will contact DME provider to arrange delivery to [facility].               [facility] aware of the above. Please notify [facility] when patient is ready to leave the unit at discharge. (Call [phone] or [phone] after 5pm.) [facility] information and contact numbers given to  [facility].       Please call with any hospice related questions.      Thank you for this referral.     Elsie Saas, RN, [facility] (listed on AMION under [facility])   [phone]

## 2020-05-24 NOTE — Consult Note (Addendum)
Consultation Note Date: 05/24/2020   Patient Name: Sarah Stone  DOB: March 31, 1918  MRN: [id number]  Age / Sex: 86 y.o., female  PCP: Sueanne Margarita, DO Referring Physician: Harold Hedge, MD  Reason for Consultation: Establishing goals of care and Psychosocial/spiritual support  HPI/Patient Profile: 86 y.o. female  with past medical history of stroke, hypertension, a.fib, and dementia admitted on 05/15/2020 with sepsis secondary to CAP, acute encephalopathy, and failure to thrive.   Clinical Assessment and Goals of Care: I have reviewed medical records including EPIC notes, labs and imaging, received report from primary RN, assessed the patient and then met with daughter/Sandra Karper HCPOA  to discuss diagnosis prognosis, GOC, EOL wishes, disposition, and options.  Patient was lying in bed awake and alert. No shortness of breath or non-verbal pain cues were noted. The patient was attempting to speak, however, language was not appropriate for conversation. She kept asking "why" - the daughter stated it is because the patient does not want to be in the hospital. Katharine Look expressed no acute concerns.  I introduced Palliative Medicine as specialized medical care for people living with serious illness. It focuses on providing relief from the symptoms and stress of a serious illness. The goal is to improve quality of life for both the patient and the family.  As far as functional status, Katharine Look states the patient was living independently until the end of March/early April. At this time, the patient went to live with Katharine Look due to a noticeable decline. Ms. Hauter was able to ambulate on her own with occasional use of a walker. Toward the end of April, Katharine Look states that Ms. Cremeans fell, with no acute physical injuries, but at this point started using a wheelchair. Katharine Look also has seen a drastic cognitive decline during this time - expressing that the  patient has been more agitated and confused, going "in and out." At the end of April, the patient was placed at [facility] in their memory care service. As far nutritional status, Katharine Look states the patient's appetite has been poor and has had poor PO intake.   When asked how the patient has been with expressing herself, Katharine Look explained that it was already hard for the patient due to being hard of hearing and her expressive aphasia; however, she has also seen a decline in this over the last two months as well.   We discussed patient's current illness and what it means in the larger context of patient's on-going co-morbidities.  I attempted to elicit values and goals of care important to the patient and family. Sandra's goal, if appropriate, is to discharge Ms. Eischen back to ALF with hospice. Her main concern was whether Medicare would cover hospice services - all questions were discussed and answered. Also offered for the hospice liaison to contact her - Katharine Look stated she would be appreciative to get more information.  Discussed with patient/family the importance of continued conversation with family and the medical providers regarding overall plan of care and treatment options, ensuring decisions are within the context of the patient's values and GOCs.    Questions and concerns were addressed. The family was encouraged to call with questions or concerns.    Primary Decision Maker HCPOA - Kelli Churn - daughter    SUMMARY OF RECOMMENDATIONS   -Continue current medical treatment -Continue DNR -Daughter's goal is for patient to get discharged back to ALF with hospice -case manager and Hospice liaison notified of request for hospice when discharged - daughter would like  hospital bed for patient - will shadow  Code Status/Advance Care Planning:  DNR   Palliative Prophylaxis:   Aspiration, Bowel Regimen, Delirium Protocol, Frequent Pain Assessment, Oral Care and Turn  Reposition  Additional Recommendations (Limitations, Scope, Preferences):  Full Scope Treatment  Psycho-social/Spiritual:   Additional Recommendations: Education on Hospice   Created space and opportunity for patient and daughter to express thoughts and feelings regarding patient's current medical situation.  Prognosis:   < 6 months d/t dementia FAST 7C  Discharge Planning: [place] with Hospice      Primary Diagnoses: Present on Admission: . Sepsis ([place])   I have reviewed the medical record, interviewed the patient and family, and examined the patient. The following aspects are pertinent.  Past Medical History:  Diagnosis Date  . A-fib ([place])   . Hypertension   . Stroke [facility])    Social History   Socioeconomic History  . Marital status: Widowed    Spouse name: Not on file  . Number of children: Not on file  . Years of education: Not on file  . Highest education level: Not on file  Occupational History  . Not on file  Tobacco Use  . Smoking status: Never Smoker  . Smokeless tobacco: Never Used  Substance and Sexual Activity  . Alcohol use: No  . Drug use: No  . Sexual activity: Never  Other Topics Concern  . Not on file  Social History Narrative  . Not on file   Social Determinants of Health   Financial Resource Strain:   . Difficulty of Paying Living Expenses:   Food Insecurity:   . Worried About Charity fundraiser in the Last Year:   . Arboriculturist in the Last Year:   Transportation Needs:   . Film/video editor (Medical):   Marland Kitchen Lack of Transportation (Non-Medical):   Physical Activity:   . Days of Exercise per Week:   . Minutes of Exercise per Session:   Stress:   . Feeling of Stress :   Social Connections:   . Frequency of Communication with Friends and Family:   . Frequency of Social Gatherings with Friends and Family:   . Attends Religious Services:   . Active Member of Clubs or Organizations:   . Attends Theatre manager Meetings:   Marland Kitchen Marital Status:    History reviewed. No pertinent family history. Scheduled Meds: . apixaban  2.5 mg Oral BID  . atenolol  25 mg Oral Daily  . dorzolamide  1 drop Right Eye BID  . ferrous sulfate  325 mg Oral Q breakfast  . furosemide  40 mg Oral Daily  . latanoprost  1 drop Both Eyes QHS  . mouth rinse  15 mL Mouth Rinse BID  . potassium chloride  40 mEq Oral Once  . QUEtiapine  12.5 mg Oral QHS  . sertraline  50 mg Oral Daily  . sodium chloride flush  3 mL Intravenous Q12H  . timolol  1 drop Both Eyes BID   Continuous Infusions: . azithromycin Stopped (05/24/20 0300)  . cefTRIAXone (ROCEPHIN)  IV 2 g (05/24/20 0932)  . magnesium sulfate bolus IVPB    . potassium chloride 10 mEq (05/24/20 1018)   PRN Meds:.acetaminophen **OR** acetaminophen, albuterol, polyethylene glycol No Known Allergies Review of Systems  Unable to perform ROS due to cognitive status  Physical Exam Pulmonary:     Effort: Pulmonary effort is normal. No respiratory distress.  Abdominal:  General: Abdomen is flat.     Palpations: Abdomen is soft.     Tenderness: There is no abdominal tenderness.  Skin:    General: Skin is warm and dry.  Neurological:     Mental Status: She is alert.  Psychiatric:        Cognition and Memory: Cognition is impaired.        Judgment: Judgment is inappropriate.     Vital Signs: BP (!) 105/92 (BP Location: Right Arm)   Pulse 67   Temp 97.6 F (36.4 C) (Axillary)   Resp 14   Wt 40.3 kg   SpO2 100% Comment: Dinamap reading- incorrect first time   BMI 16.79 kg/m  Pain Scale: PAINAD       SpO2: SpO2: 100 %(Dinamap reading- incorrect first time ) O2 Device:SpO2: 100 %(Dinamap reading- incorrect first time ) O2 Flow Rate: .   IO: Intake/output summary:   Intake/Output Summary (Last 24 hours) at 05/24/2020 1108 Last data filed at 05/24/2020 0600 Gross per 24 hour  Intake 1850 ml  Output --  Net 1850 ml    LBM: Last BM Date:  (UTA) Baseline Weight: Weight: 40.3 kg Most recent weight: Weight: 40.3 kg     Palliative Assessment/Data: PPS 20%    Discussed case with primary RN, Dr. Neysa Bonito, Social Work, and hospice liason  Time Total: 50 min  Greater than 50%  of this time was spent counseling and coordinating care related to the above assessment and plan.  Amber M. [facility] FNP-BC  [place], DNP, AGNP-C Palliative Medicine Team [phone] Pager: [phone]

## 2020-05-24 NOTE — Progress Notes (Signed)
PROGRESS NOTE    Sarah Stone    Code Status: DNR  ZOX:[id number] DOB: 1918-08-05 DOA: Jun 04, 2020 LOS: 1 days  PCP: Sueanne Margarita, DO CC:  Chief Complaint  Patient presents with  . Altered Mental Status       Hospital Summary   This is a 86 year old female with a history of atrial fibrillation, CVA with residual aphasia, hypertension who presented from SNF with altered mental status since this AM and productive cough x 1 day.  History obtained from daughter at bedside as patient was unable to provide history currently.  She was reportedly doing well yesterday with the exception of a productive cough and this a.m. 1 family visited her at the SNF she was awake but not responsive very lethargic prompting ED visit.  ED labs and course: WBC 16.3, lactic acid 2.2, K3.0.  UA negative.  CT head without acute abnormality and with chronic left MCA infarct, CXR: Improved bilateral interstitial pulmonary opacities.  Given 1 L NS bolus and broad-spectrum antibiotics. Palliative care consulted from ED  Evaluated by palliative care, plan to have hospice services follow patient once discharged. A & P   Active Problems:   Sepsis ([place])   Goals of care, counseling/discussion   Palliative care by specialist   DNR (do not resuscitate)   1. Acute metabolic encephalopathy secondary to Sepsis, likely secondary to CAP a. Sepsis resolved b. White count downtrending, lactic acidosis resolved c. Continue CAP therapy and IV fluids d. Palliative care consulted: Plan for services to follow-up once patient is discharged  2. Failure to thrive, multifactorial: sepsis, dementia a. Palliative as above b. Nutrition consult  3. Atrial fibrillation, rate controlled a. Continue home beta-blocker b. Continue Eliquis c. Telemetry  4. Right heel stage I pressure ulcer, POA a. Wound care  5. Depression a. Continue SSRI  6. Dementia with history of sundowning a. Continue Seroquel nightly, increase to  25 mg b. Palliative care consulted c. Delirium precautions  7. Hypokalemia a. Replete PO and IV b. Lactated Ringer's  8. Hypomagnesemia a. Replete  8. History of CVA with residual aphasia, appears at baseline  9. History of diastolic heart failure a. Hold Lasix for now  10. History of iron deficiency anemia on supplement  DVT prophylaxis: Eliquis Family Communication: Patient's daughter at bedside has been updated  Disposition Plan:  Status is: Inpatient  Remains inpatient appropriate because:Persistent severe electrolyte disturbances and IV treatments appropriate due to intensity of illness or inability to take PO   Dispo: The patient is from: SNF              Anticipated d/c is to: SNF              Anticipated d/c date is: 3 days              Patient currently is not medically stable to d/c.           Pressure injury documentation   Pressure Injury 05/24/20 Heel Right Deep Tissue Pressure Injury - Purple or maroon localized area of discolored intact skin or blood-filled blister due to damage of underlying soft tissue from pressure and/or shear. (Active)  05/24/20 0245  Location: Heel  Location Orientation: Right  Staging: Deep Tissue Pressure Injury - Purple or maroon localized area of discolored intact skin or blood-filled blister due to damage of underlying soft tissue from pressure and/or shear.  Wound Description (Comments):   Present on Admission: Yes     Consultants  Palliative  care   Procedures  None  Antibiotics   Anti-infectives (From admission, onward)   Start     Dose/Rate Route Frequency Ordered Stop   05/24/20 1000  cefTRIAXone (ROCEPHIN) 2 g in sodium chloride 0.9 % 100 mL IVPB     2 g 200 mL/hr over 30 Minutes Intravenous Every 24 hours 15-Jan-2020 1811     15-Jan-2020 2000  azithromycin (ZITHROMAX) 500 mg in sodium chloride 0.9 % 250 mL IVPB     500 mg 250 mL/hr over 60 Minutes Intravenous Every 24 hours 15-Jan-2020 1811     15-Jan-2020 1700   ceFEPIme (MAXIPIME) 2 g in sodium chloride 0.9 % 100 mL IVPB     2 g 200 mL/hr over 30 Minutes Intravenous  Once 15-Jan-2020 1646 15-Jan-2020 2145   15-Jan-2020 1700  metroNIDAZOLE (FLAGYL) IVPB 500 mg  Status:  Discontinued     500 mg 100 mL/hr over 60 Minutes Intravenous  Once 15-Jan-2020 1646 15-Jan-2020 1811   15-Jan-2020 1700  vancomycin (VANCOCIN) IVPB 1000 mg/200 mL premix  Status:  Discontinued     1,000 mg 200 mL/hr over 60 Minutes Intravenous  Once 15-Jan-2020 1646 15-Jan-2020 1811        Subjective   Patient unable to provide history due to underlying dementia and mental status.  Daughter at bedside.  No overnight events  Objective   Vitals:   05/24/20 0150 05/24/20 0619 05/24/20 1004 05/24/20 1345  BP:  (!) 94/53 (!) 105/92 (!) 101/52  Pulse:  64 67 98  Resp:  17 14 14   Temp:  98.1 F (36.7 C) 97.6 F (36.4 C) 97.7 F (36.5 C)  TempSrc:   Axillary Axillary  SpO2:  92% 100% 93%  Weight: 40.3 kg       Intake/Output Summary (Last 24 hours) at 05/24/2020 1809 Last data filed at 05/24/2020 1755 Gross per 24 hour  Intake 1850 ml  Output 450 ml  Net 1400 ml   Filed Weights   05/24/20 0150  Weight: 40.3 kg    Examination:  Physical Exam Vitals and nursing note reviewed. Exam conducted with a chaperone present.  Constitutional:      General: She is not in acute distress.    Appearance: She is not toxic-appearing.  HENT:     Head: Normocephalic.     Mouth/Throat:     Mouth: Mucous membranes are moist.  Eyes:     Conjunctiva/sclera: Conjunctivae normal.  Cardiovascular:     Rate and Rhythm: Normal rate. Rhythm irregular.  Pulmonary:     Effort: Pulmonary effort is normal. No respiratory distress.  Musculoskeletal:        General: No swelling.  Neurological:     Mental Status: She is alert. She is disoriented.  Psychiatric:        Behavior: Behavior normal.     Data Reviewed: I have personally reviewed following labs and imaging studies  CBC: Recent Labs  Lab  15-Jan-2020 1222 05/24/20 0516  WBC 16.3* 15.2*  HGB 13.8 10.5*  HCT 40.4 31.6*  MCV 103.6* 101.9*  PLT 317 306   Basic Metabolic Panel: Recent Labs  Lab 15-Jan-2020 1222 05/24/20 0516  NA 137 139  K 3.0* 2.8*  CL 97* 102  CO2 25 24  GLUCOSE 120* 106*  BUN 18 19  CREATININE 0.73 0.59  CALCIUM 8.2* 7.7*  MG 1.8 1.6*   GFR: Estimated Creatinine Clearance: 23.8 mL/min (by C-G formula based on SCr of 0.59 mg/dL). Liver Function Tests: Recent Labs  Lab  05/17/2020 1222  AST 28  ALT 21  ALKPHOS 122  BILITOT 1.3*  PROT 7.1  ALBUMIN 3.0*   No results for input(s): LIPASE, AMYLASE in the last 168 hours. No results for input(s): AMMONIA in the last 168 hours. Coagulation Profile: Recent Labs  Lab 05/24/20 0516  INR 2.5*   Cardiac Enzymes: No results for input(s): CKTOTAL, CKMB, CKMBINDEX, TROPONINI in the last 168 hours. BNP (last 3 results) No results for input(s): PROBNP in the last 8760 hours. HbA1C: No results for input(s): HGBA1C in the last 72 hours. CBG: No results for input(s): GLUCAP in the last 168 hours. Lipid Profile: No results for input(s): CHOL, HDL, LDLCALC, TRIG, CHOLHDL, LDLDIRECT in the last 72 hours. Thyroid Function Tests: Recent Labs    05/24/20 0516  TSH 5.277*   Anemia Panel: No results for input(s): VITAMINB12, FOLATE, FERRITIN, TIBC, IRON, RETICCTPCT in the last 72 hours. Sepsis Labs: Recent Labs  Lab 05/14/2020 1517 05/24/20 0516  PROCALCITON  --  0.26  LATICACIDVEN 2.2* 1.7    Recent Results (from the past 240 hour(s))  SARS Coronavirus 2 by RT PCR (hospital order, performed in [facility] hospital lab) Nasopharyngeal Nasopharyngeal Swab     Status: None   Collection Time: 05/01/2020 12:49 PM   Specimen: Nasopharyngeal Swab  Result Value Ref Range Status   SARS Coronavirus 2 NEGATIVE NEGATIVE Final    Comment: (NOTE) SARS-CoV-2 target nucleic acids are NOT DETECTED. The SARS-CoV-2 RNA is generally detectable in upper and  lower respiratory specimens during the acute phase of infection. The lowest concentration of SARS-CoV-2 viral copies this assay can detect is 250 copies / mL. A negative result does not preclude SARS-CoV-2 infection and should not be used as the sole basis for treatment or other patient management decisions.  A negative result may occur with improper specimen collection / handling, submission of specimen other than nasopharyngeal swab, presence of viral mutation(s) within the areas targeted by this assay, and inadequate number of viral copies (<250 copies / mL). A negative result must be combined with clinical observations, patient history, and epidemiological information. Fact Sheet for Patients:   BoilerBrush.com.cy Fact Sheet for Healthcare Providers: https://pope.com/ This test is not yet approved or cleared  by the [place] FDA and has been authorized for detection and/or diagnosis of SARS-CoV-2 by FDA under an Emergency Use Authorization (EUA).  This EUA will remain in effect (meaning this test can be used) for the duration of the COVID-19 declaration under Section 564(b)(1) of the Act, 21 U.S.C. section 360bbb-3(b)(1), unless the authorization is terminated or revoked sooner. Performed at [facility], [address], [place], [place] [address]   Blood culture (routine x 2)     Status: None (Preliminary result)   Collection Time: 05/27/2020  3:16 PM   Specimen: BLOOD  Result Value Ref Range Status   Specimen Description   Final    BLOOD RIGHT ANTECUBITAL Performed at [facility], [address] [place], [place] [address]    Special Requests   Final    BOTTLES DRAWN AEROBIC ONLY Blood Culture results may not be optimal due to an inadequate volume of blood received in culture bottles Performed at [facility], [address], [place], [place] [address]    Culture    Final    NO GROWTH < 24 HOURS Performed at [facility], [address], [place], [place] [address]    Report Status PENDING  Incomplete  Blood culture (routine x 2)     Status: None (Preliminary result)   Collection Time: 05/12/2020  3:24 PM   Specimen: BLOOD RIGHT FOREARM  Result Value Ref Range Status   Specimen Description   Final    BLOOD RIGHT FOREARM Performed at [facility], [address] [place], [place] [address]    Special Requests   Final    BOTTLES DRAWN AEROBIC ONLY Blood Culture results may not be optimal due to an inadequate volume of blood received in culture bottles Performed at [facility], [address], [place], [place] [address]    Culture   Final    NO GROWTH < 24 HOURS Performed at [facility], [address], [place], [place] [address]    Report Status PENDING  Incomplete  MRSA PCR Screening     Status: None   Collection Time: 05/13/2020 11:02 PM   Specimen: Nasal Mucosa; Nasopharyngeal  Result Value Ref Range Status   MRSA by PCR NEGATIVE NEGATIVE Final    Comment:        The GeneXpert MRSA Assay (FDA approved for NASAL specimens only), is one component of a comprehensive MRSA colonization surveillance program. It is not intended to diagnose MRSA infection nor to guide or monitor treatment for MRSA infections. Performed at [facility], [address], [place], [place] [address]          Radiology Studies: CT Head Wo Contrast  Result Date: 05/15/2020 CLINICAL DATA:  Decreased responsiveness. EXAM: CT HEAD WITHOUT CONTRAST TECHNIQUE: Contiguous axial images were obtained from the base of the skull through the vertex without intravenous contrast. COMPARISON:  03/15/2020 FINDINGS: Brain: There is no evidence of acute infarct, intracranial hemorrhage, mass, midline shift, or extra-axial fluid collection. A small to moderate-sized chronic left MCA infarct is again seen involving  the lateral frontal lobe, insula, and basal ganglia. A chronic lacunar infarct involving the posterior right basal ganglia/internal capsule is also unchanged. Hypodensities elsewhere in the cerebral white matter bilaterally are unchanged and nonspecific but compatible with moderate chronic small vessel ischemic disease. Cerebral atrophy is unchanged. Vascular: Calcified atherosclerosis at the skull base. No hyperdense vessel. Skull: No fracture or suspicious osseous lesion. Sinuses/Orbits: Visualized paranasal sinuses and mastoid air cells are clear. Bilateral cataract extraction. Other: None. IMPRESSION: 1. No evidence of acute intracranial abnormality. 2. Moderate chronic small vessel ischemic disease. Chronic left MCA infarct. Electronically Signed   By: Sebastian Ache M.D.   On: 05/02/2020 14:16   DG Chest Portable 1 View  Result Date: 05/28/2020 CLINICAL DATA:  Decreased alertness. EXAM: PORTABLE CHEST 1 VIEW COMPARISON:  Chest radiograph 05/20/2017 FINDINGS: Monitoring leads overlie the patient. Patient is mildly rotated. Stable enlarged cardiac and mediastinal contours. No large area pulmonary consolidation. No pleural effusion or pneumothorax. Thoracic spine degenerative changes. IMPRESSION: Improved bilateral interstitial pulmonary opacities. Electronically Signed   By: Annia Belt M.D.   On: 05/04/2020 14:29        Scheduled Meds: . apixaban  2.5 mg Oral BID  . atenolol  25 mg Oral Daily  . dorzolamide  1 drop Right Eye BID  . ferrous sulfate  325 mg Oral Q breakfast  . furosemide  40 mg Oral Daily  . latanoprost  1 drop Both Eyes QHS  . mouth rinse  15 mL Mouth Rinse BID  . QUEtiapine  12.5 mg Oral QHS  . sertraline  50 mg Oral Daily  . sodium chloride flush  3 mL Intravenous Q12H  . timolol  1 drop Both Eyes BID   Continuous Infusions: . azithromycin Stopped (05/24/20 0300)  . cefTRIAXone (ROCEPHIN)  IV 2 g (05/24/20 0932)     Time spent: 26 minutes with over 50% of the time  coordinating the patient's care    Jae Dire, DO [facility] Pager [phone]  Call night coverage person covering after 7pm

## 2020-05-24 NOTE — [place] FL2 (Addendum)
[place] MEDICAID FL2 LEVEL OF CARE SCREENING TOOL     IDENTIFICATION  Patient Name: Sarah Stone Birthdate: Sep 02, 1918 Sex: female Admission Date (Current Location): 2020-05-29  [facility] [place] and [place] Number:  Herbalist and Address:  [facility],  [address] [place], [place]      Provider Number: [id number]  Attending Physician Name and Address:  Harold Hedge, MD  Relative Name and Phone Number:  Kelli Churn dtr [phone]    Current Level of Care: Hospital Memory Care/Hospice Prior Approval Number:    Date Approved/Denied:   PASRR Number:    Discharge Plan: Memory Care/Hospice   Current Diagnoses: Patient Active Problem List   Diagnosis Date Noted  . Goals of care, counseling/discussion   . Palliative care by specialist   . DNR (do not resuscitate)   . Sepsis ([place]) May 29, 2020  . Small bowel obstruction ([place]) 02/04/2017  . Glaucoma of both eyes 02/18/2016  . Dysarthria due to cerebrovascular accident (CVA) 01/24/2016  . Dysphagia S/P CVA (cerebrovascular accident) 01/24/2016  . Cognitive deficit S/P CVA (cerebrovascular accident) 01/24/2016  . Ischemic stroke of frontal lobe ([place])   . CVA (cerebral infarction) 01/19/2016  . Benign essential HTN 01/19/2016  . Chronic atrial fibrillation ([place]) 01/19/2016  . Difficulty speaking   . Lactic acidosis     Orientation RESPIRATION BLADDER Height & Weight        Normal Incontinent Weight: 40.3 kg Height:     BEHAVIORAL SYMPTOMS/MOOD NEUROLOGICAL BOWEL NUTRITION STATUS      Incontinent Diet NAS  AMBULATORY STATUS COMMUNICATION OF NEEDS Skin   Total Care Does not communicate(non sensical) PU Stage and Appropriate Care(Unstageable R heel wound-foam dsg)                       Personal Care Assistance Level of Assistance              Functional Limitations Info  Sight, Hearing, Speech Sight Info: Adequate Hearing Info: Impaired(Bilateral hearing aids) Speech Info:  Impaired(non sensical)    SPECIAL CARE FACTORS FREQUENCY  (Total care-asst w/feeding,w/c bound.)                    Contractures Contractures Info: Not present    Additional Factors Info  Code Status, Allergies, Psychotropic Code Status Info: DNR Allergies Info: NKA Psychotropic Info: Seroquel;zoloft         Current Medications (05/24/2020):  This is the current hospital active medication list Current Facility-Administered Medications  Medication Dose Route Frequency Provider Last Rate Last Admin  . acetaminophen (TYLENOL) tablet 650 mg  650 mg Oral Q6H PRN Harold Hedge, MD       Or  . acetaminophen (TYLENOL) suppository 650 mg  650 mg Rectal Q6H PRN Harold Hedge, MD      . albuterol (PROVENTIL) (2.5 MG/3ML) 0.083% nebulizer solution 2.5 mg  2.5 mg Nebulization Q2H PRN Harold Hedge, MD      . apixaban Arne [place]) tablet 2.5 mg  2.5 mg Oral BID Harold Hedge, MD   2.5 mg at 05/24/20 1105  . atenolol (TENORMIN) tablet 25 mg  25 mg Oral Daily Harold Hedge, MD   25 mg at 05/24/20 1105  . azithromycin (ZITHROMAX) 500 mg in sodium chloride 0.9 % 250 mL IVPB  500 mg Intravenous Q24H Harold Hedge, MD   Stopped at 05/24/20 0300  . cefTRIAXone (ROCEPHIN) 2 g in sodium chloride 0.9 % 100  mL IVPB  2 g Intravenous Q24H Jae Dire, MD 200 mL/hr at 05/24/20 0932 2 g at 05/24/20 0932  . dorzolamide (TRUSOPT) 2 % ophthalmic solution 1 drop  1 drop Right Eye BID Jae Dire, MD   1 drop at 05/24/20 1133  . ferrous sulfate tablet 325 mg  325 mg Oral Q breakfast Jae Dire, MD   325 mg at 05/24/20 1105  . furosemide (LASIX) tablet 40 mg  40 mg Oral Daily Jae Dire, MD   40 mg at 05/24/20 1106  . latanoprost (XALATAN) 0.005 % ophthalmic solution 1 drop  1 drop Both Eyes QHS Jae Dire, MD      . MEDLINE mouth rinse  15 mL Mouth Rinse BID Jae Dire, MD   15 mL at 05/24/20 1127  . polyethylene glycol (MIRALAX / GLYCOLAX) packet 17 g  17 g Oral Daily PRN Jae Dire, MD      . QUEtiapine (SEROQUEL) tablet 12.5 mg  12.5 mg Oral QHS Jae Dire, MD   12.5 mg at 05/19/2020 2259  . sertraline (ZOLOFT) tablet 50 mg  50 mg Oral Daily Jae Dire, MD   50 mg at 05/24/20 1106  . sodium chloride flush (NS) 0.9 % injection 3 mL  3 mL Intravenous Q12H Jae Dire, MD      . timolol (TIMOPTIC) 0.5 % ophthalmic solution 1 drop  1 drop Both Eyes BID Jae Dire, MD   1 drop at 05/24/20 1133     Discharge Medications: Please see discharge summary for a list of discharge medications.  Relevant Imaging Results:  Relevant Lab Results:   Additional Information SSN [id number]  Lanier Clam, RN

## 2020-05-25 LAB — BASIC METABOLIC PANEL
Anion gap: 9 (ref 5–15)
Anion gap: 11 (ref 5–15)
BUN: 14 mg/dL (ref 8–23)
BUN: 13 mg/dL (ref 8–23)
CO2: 27 mmol/L (ref 22–32)
CO2: 26 mmol/L (ref 22–32)
Calcium: 7.8 mg/dL — ABNORMAL LOW (ref 8.9–10.3)
Calcium: 7.8 mg/dL — ABNORMAL LOW (ref 8.9–10.3)
Chloride: 102 mmol/L (ref 98–111)
Chloride: 103 mmol/L (ref 98–111)
Creatinine, Ser: 0.54 mg/dL (ref 0.44–1.00)
Creatinine, Ser: 0.54 mg/dL (ref 0.44–1.00)
GFR calc Af Amer: >60 mL/min (ref >60)
GFR calc Af Amer: >60 mL/min (ref >60)
GFR calc non Af Amer: >60 mL/min (ref >60)
GFR calc non Af Amer: >60 mL/min (ref >60)
Glucose, Bld: 79 mg/dL (ref 70–99)
Glucose, Bld: 76 mg/dL (ref 70–99)
Potassium: 2.8 mmol/L — ABNORMAL LOW (ref 3.5–5.1)
Potassium: 3.4 mmol/L — ABNORMAL LOW (ref 3.5–5.1)
Sodium: 138 mmol/L (ref 135–145)
Sodium: 140 mmol/L (ref 135–145)

## 2020-05-25 LAB — CBC
HCT: 31.7 % — ABNORMAL LOW (ref 36.0–46.0)
Hemoglobin: 10.5 g/dL — ABNORMAL LOW (ref 12.0–15.0)
MCH: 33 pg (ref 26.0–34.0)
MCHC: 33.1 g/dL (ref 30.0–36.0)
MCV: 99.7 fL (ref 80.0–100.0)
Platelets: 302 10*3/uL (ref 150–400)
RBC: 3.18 MIL/uL — ABNORMAL LOW (ref 3.87–5.11)
RDW: 13.2 % (ref 11.5–15.5)
WBC: 9.8 10*3/uL (ref 4.0–10.5)
nRBC: 0 % (ref 0.0–0.2)

## 2020-05-25 LAB — MAGNESIUM: Magnesium: 1.8 mg/dL (ref 1.7–2.4)

## 2020-05-25 MED ORDER — CEFPODOXIME PROXETIL 100 MG PO TABS: 100.0000 mg | ORAL_TABLET | Freq: Two times a day (BID) | ORAL | 0 refills | Status: AC

## 2020-05-25 MED ORDER — POTASSIUM CHLORIDE 10 MEQ/100ML IV SOLN
10.0000 meq | INTRAVENOUS | Status: AC
  Administered 2020-05-25 (×2): 10 meq via INTRAVENOUS
  Filled 2020-05-25 (×2): qty 100

## 2020-05-25 MED ORDER — AZITHROMYCIN 250 MG PO TABS
250.0000 mg | ORAL_TABLET | Freq: Every day | ORAL | 0 refills | Status: AC
Start: 2020-05-26 — End: 2020-05-28

## 2020-05-25 MED ORDER — POTASSIUM CHLORIDE CRYS ER 20 MEQ PO TBCR
40.0000 meq | EXTENDED_RELEASE_TABLET | Freq: Once | ORAL | Status: AC
  Administered 2020-05-25: 40 meq via ORAL
  Filled 2020-05-25: qty 2

## 2020-05-25 NOTE — TOC Progression Note (Signed)
Transition of Care [facility]) - Progression Note    Patient Details  Name: Sarah Stone MRN: [id number] Date of Birth: [birth year]  Transition of Care [facility]) CM/SW Contact  Damyah Gugel, Olegario Messier, RN Phone Number: 05/25/2020, 12:03 PM  Clinical Narrative: Sherron Monday to [facility] Memory [facility] rep Imani-about d/c plans-Hospice-authora care rep Victorino Dike assisting w/dme-hospital bed to be delivered tomorrow, & meds since pharmacy @ [facility] will be closed weekend, & Monday.PTAR needed @ d/c-DNR signed in shadow chart.      Expected Discharge Plan: Memory Care Barriers to Discharge: Continued Medical Work up  Expected Discharge Plan and Services Expected Discharge Plan: Memory Care   Discharge Planning Services: CM Consult Post Acute Care Choice: Hospice(ALF-memory care)                             HH Arranged: RN [facility] Agency: [facility] Date [facility] Agency Contacted: 05/25/20 Time HH Agency Contacted: 1203 Representative spoke with at [facility] Agency: Wallis Bamberg   Social Determinants of Health (SDOH) Interventions    Readmission Risk Interventions No flowsheet data found.

## 2020-05-25 NOTE — Progress Notes (Signed)
Civil engineer, contracting   DME ordered to be delivered today.  Pt has paper scripts for abx on chart due to facility stating they could not accept it electronically. Meds are filled by facility.  Plan to d/c Saturday morning back to [facility].  Wallis Bamberg RN, BSN, CCRN [facility] Liaison

## 2020-05-25 NOTE — Care Management Important Message (Signed)
Important Message  Patient Details IM Letter given to Lanier Clam RN to present to the Patient Name: Sarah Stone MRN: [id number] Date of Birth: December 10, 1918   Medicare Important Message Given:  Yes     Caren Macadam 05/25/2020, 10:12 AM

## 2020-05-25 NOTE — Progress Notes (Signed)
PROGRESS NOTE    Sarah Stone    Code Status: DNR  IWP:[id number] DOB: 11/29/1918 DOA: 05/03/2020 LOS: 2 days  PCP: Charlane Ferretti, DO CC:  Chief Complaint  Patient presents with  . Altered Mental Status       Hospital Summary   This is a 86 year old female with a history of atrial fibrillation, CVA with residual aphasia, hypertension who presented from SNF with altered mental status since this AM and productive cough x 1 day.  History obtained from daughter at bedside as patient was unable to provide history currently.  She was reportedly doing well yesterday with the exception of a productive cough and this a.m. 1 family visited her at the SNF she was awake but not responsive very lethargic prompting ED visit.  ED labs and course: WBC 16.3, lactic acid 2.2, K3.0.  UA negative.  CT head without acute abnormality and with chronic left MCA infarct, CXR: Improved bilateral interstitial pulmonary opacities.  Given 1 L NS bolus and broad-spectrum antibiotics. Palliative care consulted from ED  Evaluated by palliative care, plan to have hospice services follow patient once discharged.  A & P   Active Problems:   Sepsis (HCC)   Goals of care, counseling/discussion   Palliative care by specialist   DNR (do not resuscitate)   1. Acute metabolic encephalopathy due to Sepsis secondary to CAP a. Sepsis resolved, mentation improved b. Continue CAP therapy with ceftriaxone and azithromycin c. Can likely discharge tomorrow on oral antibiotics to facility with hospice  2. Failure to thrive, multifactorial: sepsis, dementia a. Plan as above b. Nutrition consult  3. Atrial fibrillation, rate controlled a. Continue home beta-blocker b. Continue Eliquis c. Discontinue telemetry  4. Right heel stage I pressure ulcer, POA a. Wound care  5. Depression a. Continue SSRI  6. Dementia with history of sundowning a. Continue Seroquel nightly at 25 mg b. Palliative care on  board c. Delirium precautions  7. Hypokalemia a. resolved  8. Hypomagnesemia a. Resolved  8. History of CVA with residual aphasia, appears at baseline  9. History of diastolic heart failure a. Hold Lasix for now  10. History of iron deficiency anemia on supplement  DVT prophylaxis: Eliquis Family Communication: Patient's daughter at bedside has been updated today Disposition Plan:  Status is: Inpatient  Remains inpatient appropriate because:IV treatments appropriate due to intensity of illness or inability to take PO and Awaiting hospice bed delivery to facility plan for discharge tomorrow   Dispo: The patient is from: SNF              Anticipated d/c is to: SNF              Anticipated d/c date is: 1 day              Patient currently is medically stable to d/c.           Pressure injury documentation   Pressure Injury 05/24/20 Heel Right Deep Tissue Pressure Injury - Purple or maroon localized area of discolored intact skin or blood-filled blister due to damage of underlying soft tissue from pressure and/or shear. (Active)  05/24/20 0245  Location: Heel  Location Orientation: Right  Staging: Deep Tissue Pressure Injury - Purple or maroon localized area of discolored intact skin or blood-filled blister due to damage of underlying soft tissue from pressure and/or shear.  Wound Description (Comments):   Present on Admission: Yes     Consultants  Palliative care   Procedures  None  Antibiotics   Anti-infectives (From admission, onward)   Start     Dose/Rate Route Frequency Ordered Stop   05/20/2020 0000  cefpodoxime (VANTIN) 100 MG tablet     100 mg Oral 2 times daily 05/25/20 1257 05/28/20 2359   05/18/2020 0000  azithromycin (ZITHROMAX) 250 MG tablet     250 mg Oral Daily 05/25/20 1257 05/28/20 2359   05/24/20 1000  cefTRIAXone (ROCEPHIN) 2 g in sodium chloride 0.9 % 100 mL IVPB     2 g 200 mL/hr over 30 Minutes Intravenous Every 24 hours 2020/11/04 1811      2020/11/04 2000  azithromycin (ZITHROMAX) 500 mg in sodium chloride 0.9 % 250 mL IVPB     500 mg 250 mL/hr over 60 Minutes Intravenous Every 24 hours 2020/11/04 1811     2020/11/04 1700  ceFEPIme (MAXIPIME) 2 g in sodium chloride 0.9 % 100 mL IVPB     2 g 200 mL/hr over 30 Minutes Intravenous  Once 2020/11/04 1646 2020/11/04 2145   2020/11/04 1700  metroNIDAZOLE (FLAGYL) IVPB 500 mg  Status:  Discontinued     500 mg 100 mL/hr over 60 Minutes Intravenous  Once 2020/11/04 1646 2020/11/04 1811   2020/11/04 1700  vancomycin (VANCOCIN) IVPB 1000 mg/200 mL premix  Status:  Discontinued     1,000 mg 200 mL/hr over 60 Minutes Intravenous  Once 2020/11/04 1646 2020/11/04 1811        Subjective   Patient sleeping and resting comfortably.  Daughter at bedside states patient has not been eating much.  No overnight events.  Objective   Vitals:   05/24/20 1345 05/24/20 1956 05/25/20 0457 05/25/20 0800  BP: (!) 101/52 124/72 97/63   Pulse: 98 69 65   Resp: 14  18   Temp: 97.7 F (36.5 C) 98.1 F (36.7 C) 98.1 F (36.7 C)   TempSrc: Axillary     SpO2: 93% (!) 87%    Weight:      Height:    5\' 4"  (1.626 m)    Intake/Output Summary (Last 24 hours) at 05/25/2020 1313 Last data filed at 05/25/2020 0918 Gross per 24 hour  Intake 966.61 ml  Output 450 ml  Net 516.61 ml   Filed Weights   05/24/20 0150  Weight: 40.3 kg    Examination:  Physical Exam Vitals and nursing note reviewed.  Constitutional:      General: She is not in acute distress.    Appearance: She is not toxic-appearing.     Comments: Arousable to verbal stimuli and responding to commands  HENT:     Head: Normocephalic.  Cardiovascular:     Rate and Rhythm: Normal rate. Rhythm irregular.  Pulmonary:     Effort: Pulmonary effort is normal. No respiratory distress.  Abdominal:     General: There is no distension.     Tenderness: There is no abdominal tenderness.  Musculoskeletal:        General: No swelling.     Right lower leg:  No edema.     Left lower leg: No edema.  Neurological:     Comments: Expressive aphasia  Psychiatric:        Mood and Affect: Mood normal.        Behavior: Behavior normal.     Data Reviewed: I have personally reviewed following labs and imaging studies  CBC: Recent Labs  Lab 2020/11/04 1222 05/24/20 0516 05/25/20 0505  WBC 16.3* 15.2* 9.8  HGB 13.8 10.5* 10.5*  HCT 40.4  31.6* 31.7*  MCV 103.6* 101.9* 99.7  PLT 317 306 626   Basic Metabolic Panel: Recent Labs  Lab 05/28/20 1222 05/24/20 0516 05/25/20 0505 05/25/20 1102  NA 137 139 138 140  K 3.0* 2.8* 2.8* 3.4*  CL 97* 102 102 103  CO2 25 24 27 26   GLUCOSE 120* 106* 79 76  BUN 18 19 14 13   CREATININE 0.73 0.59 0.54 0.54  CALCIUM 8.2* 7.7* 7.8* 7.8*  MG 1.8 1.6* 1.8  --    GFR: Estimated Creatinine Clearance: 23.8 mL/min (by C-G formula based on SCr of 0.54 mg/dL). Liver Function Tests: Recent Labs  Lab 05-28-20 1222  AST 28  ALT 21  ALKPHOS 122  BILITOT 1.3*  PROT 7.1  ALBUMIN 3.0*   No results for input(s): LIPASE, AMYLASE in the last 168 hours. No results for input(s): AMMONIA in the last 168 hours. Coagulation Profile: Recent Labs  Lab 05/24/20 0516  INR 2.5*   Cardiac Enzymes: No results for input(s): CKTOTAL, CKMB, CKMBINDEX, TROPONINI in the last 168 hours. BNP (last 3 results) No results for input(s): PROBNP in the last 8760 hours. HbA1C: No results for input(s): HGBA1C in the last 72 hours. CBG: No results for input(s): GLUCAP in the last 168 hours. Lipid Profile: No results for input(s): CHOL, HDL, LDLCALC, TRIG, CHOLHDL, LDLDIRECT in the last 72 hours. Thyroid Function Tests: Recent Labs    05/24/20 0516  TSH 5.277*   Anemia Panel: No results for input(s): VITAMINB12, FOLATE, FERRITIN, TIBC, IRON, RETICCTPCT in the last 72 hours. Sepsis Labs: Recent Labs  Lab 2020/05/28 1517 05/24/20 0516  PROCALCITON  --  0.26  LATICACIDVEN 2.2* 1.7    Recent Results (from the past 240  hour(s))  SARS Coronavirus 2 by RT PCR (hospital order, performed in [facility] hospital lab) Nasopharyngeal Nasopharyngeal Swab     Status: None   Collection Time: 28-May-2020 12:49 PM   Specimen: Nasopharyngeal Swab  Result Value Ref Range Status   SARS Coronavirus 2 NEGATIVE NEGATIVE Final    Comment: (NOTE) SARS-CoV-2 target nucleic acids are NOT DETECTED. The SARS-CoV-2 RNA is generally detectable in upper and lower respiratory specimens during the acute phase of infection. The lowest concentration of SARS-CoV-2 viral copies this assay can detect is 250 copies / mL. A negative result does not preclude SARS-CoV-2 infection and should not be used as the sole basis for treatment or other patient management decisions.  A negative result may occur with improper specimen collection / handling, submission of specimen other than nasopharyngeal swab, presence of viral mutation(s) within the areas targeted by this assay, and inadequate number of viral copies (<250 copies / mL). A negative result must be combined with clinical observations, patient history, and epidemiological information. Fact Sheet for Patients:   StrictlyIdeas.no Fact Sheet for Healthcare Providers: BankingDealers.co.za This test is not yet approved or cleared  by the [place] FDA and has been authorized for detection and/or diagnosis of SARS-CoV-2 by FDA under an Emergency Use Authorization (EUA).  This EUA will remain in effect (meaning this test can be used) for the duration of the COVID-19 declaration under Section 564(b)(1) of the Act, 21 U.S.C. section 360bbb-3(b)(1), unless the authorization is terminated or revoked sooner. Performed at [facility], [place] [address] [place], [place] [address]   Blood culture (routine x 2)     Status: None (Preliminary result)   Collection Time: 2020-05-28  3:16 PM   Specimen: BLOOD  Result Value Ref Range Status  Specimen Description   Final    BLOOD RIGHT ANTECUBITAL Performed at [facility], [address], [place], [place] [address]    Special Requests   Final    BOTTLES DRAWN AEROBIC ONLY Blood Culture results may not be optimal due to an inadequate volume of blood received in culture bottles Performed at [facility], [address], [place], [place] [address]    Culture   Final    NO GROWTH 2 DAYS Performed at [facility], [address] [place], [place] [address]    Report Status PENDING  Incomplete  Blood culture (routine x 2)     Status: None (Preliminary result)   Collection Time: 2020/05/27  3:24 PM   Specimen: BLOOD RIGHT FOREARM  Result Value Ref Range Status   Specimen Description   Final    BLOOD RIGHT FOREARM Performed at [facility], [address], [place], [place] [address]    Special Requests   Final    BOTTLES DRAWN AEROBIC ONLY Blood Culture results may not be optimal due to an inadequate volume of blood received in culture bottles Performed at [facility], [address] [place], [place] [address]    Culture   Final    NO GROWTH 2 DAYS Performed at [facility], [address] [place], [place] [address]    Report Status PENDING  Incomplete  MRSA PCR Screening     Status: None   Collection Time: May 27, 2020 11:02 PM   Specimen: Nasal Mucosa; Nasopharyngeal  Result Value Ref Range Status   MRSA by PCR NEGATIVE NEGATIVE Final    Comment:        The GeneXpert MRSA Assay (FDA approved for NASAL specimens only), is one component of a comprehensive MRSA colonization surveillance program. It is not intended to diagnose MRSA infection nor to guide or monitor treatment for MRSA infections. Performed at [facility], [address], [place], [place] [address]          Radiology Studies: CT Head Wo Contrast  Result Date: 05-27-20 CLINICAL DATA:   Decreased responsiveness. EXAM: CT HEAD WITHOUT CONTRAST TECHNIQUE: Contiguous axial images were obtained from the base of the skull through the vertex without intravenous contrast. COMPARISON:  03/15/2020 FINDINGS: Brain: There is no evidence of acute infarct, intracranial hemorrhage, mass, midline shift, or extra-axial fluid collection. A small to moderate-sized chronic left MCA infarct is again seen involving the lateral frontal lobe, insula, and basal ganglia. A chronic lacunar infarct involving the posterior right basal ganglia/internal capsule is also unchanged. Hypodensities elsewhere in the cerebral white matter bilaterally are unchanged and nonspecific but compatible with moderate chronic small vessel ischemic disease. Cerebral atrophy is unchanged. Vascular: Calcified atherosclerosis at the skull base. No hyperdense vessel. Skull: No fracture or suspicious osseous lesion. Sinuses/Orbits: Visualized paranasal sinuses and mastoid air cells are clear. Bilateral cataract extraction. Other: None. IMPRESSION: 1. No evidence of acute intracranial abnormality. 2. Moderate chronic small vessel ischemic disease. Chronic left MCA infarct. Electronically Signed   By: Sebastian Ache M.D.   On: 05/27/2020 14:16   DG Chest Portable 1 View  Result Date: 05-27-2020 CLINICAL DATA:  Decreased alertness. EXAM: PORTABLE CHEST 1 VIEW COMPARISON:  Chest radiograph 05/20/2017 FINDINGS: Monitoring leads overlie the patient. Patient is mildly rotated. Stable enlarged cardiac and mediastinal contours. No large area pulmonary consolidation. No pleural effusion or pneumothorax. Thoracic spine degenerative changes. IMPRESSION: Improved bilateral interstitial pulmonary opacities. Electronically  Signed   By: Annia Belt M.D.   On: 2020-05-30 14:29        Scheduled Meds: . apixaban  2.5 mg Oral BID  . atenolol  25 mg Oral Daily  . dorzolamide  1 drop Right Eye BID  . ferrous sulfate  325 mg Oral Q breakfast  . latanoprost  1  drop Both Eyes QHS  . mouth rinse  15 mL Mouth Rinse BID  . QUEtiapine  25 mg Oral QHS  . sertraline  50 mg Oral Daily  . sodium chloride flush  3 mL Intravenous Q12H  . timolol  1 drop Both Eyes BID   Continuous Infusions: . azithromycin 500 mg (05/24/20 2102)  . cefTRIAXone (ROCEPHIN)  IV 2 g (05/25/20 5638)     Time spent: 25 minutes with over 50% of the time coordinating the patient's care    Jae Dire, DO [facility] Pager [phone]  Call night coverage person covering after 7pm

## 2020-05-26 LAB — SARS CORONAVIRUS 2 (TAT 6-24 HRS): SARS Coronavirus 2: NEGATIVE

## 2020-05-26 MED ORDER — LORAZEPAM 2 MG/ML PO CONC: 1.0000 mg | ORAL | Status: DC | PRN

## 2020-05-26 MED ORDER — SCOPOLAMINE 1 MG/3DAYS TD PT72
1.0000 | MEDICATED_PATCH | TRANSDERMAL | Status: DC
  Administered 2020-05-26: 1.5 mg via TRANSDERMAL
  Filled 2020-05-26: qty 1

## 2020-05-26 MED ORDER — LORAZEPAM 2 MG/ML IJ SOLN: 1.0000 mg | INTRAMUSCULAR | Status: DC | PRN

## 2020-05-26 MED ORDER — MORPHINE SULFATE (PF) 2 MG/ML IV SOLN
1.0000 mg | Freq: Once | INTRAVENOUS | Status: AC
  Administered 2020-05-26: 1 mg via INTRAVENOUS
  Filled 2020-05-26: qty 1

## 2020-05-26 MED ORDER — LORAZEPAM 1 MG PO TABS: 1.0000 mg | ORAL_TABLET | ORAL | Status: DC | PRN

## 2020-05-26 MED ORDER — MORPHINE BOLUS VIA INFUSION
1.0000 mg | INTRAVENOUS | Status: DC | PRN
  Filled 2020-05-26: qty 1

## 2020-05-26 MED ORDER — SCOPOLAMINE 1 MG/3DAYS TD PT72: 1.0000 | MEDICATED_PATCH | TRANSDERMAL | 12 refills | Status: AC

## 2020-05-26 MED ORDER — MORPHINE 100MG IN NS 100ML (1MG/ML) PREMIX INFUSION
1.0000 mg/h | INTRAVENOUS | Status: DC
  Administered 2020-05-26: 1 mg/h via INTRAVENOUS
  Filled 2020-05-26: qty 100

## 2020-05-27 NOTE — Progress Notes (Addendum)
Pt passed away. 2 RNs (Louie Flenner and Karie Schwalbe) pronounced time of death 05-May-2214. On call MD notified. Robb Matar MD came to [place] to fill out death certificate. Pt daughter notified. Daughter asked if it was OK to come say goodbye before staff took pt downstairs. Financial controller. Family states they would like to use [employer], maintenance (IT) on [address] in [place].  Family also requesting copies of death certificates. Per [facility], we can not provide copies of death certificates, RN notified family of this. Families questions answered. Post mortem care provided. RN unable to remove upper dentures.  Bed placement notified. Transporter transferred pt to morgue.   Trinda Pascal RN

## 2020-05-27 NOTE — Progress Notes (Signed)
This RN wasted 94 mg of IV morphine from pts IV morphine drip. Waste witnessed by charge nurse Karie Schwalbe RN, medication disposed of in stericycle in 4E med room. Unable to document waste in pyxis, due to fact that med did not come from the pyxis.   Rolly Salter Yarel Kilcrease RN

## 2020-05-28 LAB — CULTURE, BLOOD (ROUTINE X 2)
Culture: NO GROWTH
Culture: NO GROWTH

## 2020-05-29 NOTE — Progress Notes (Signed)
PROGRESS NOTE    Sarah Stone    Code Status: DNR  [name]:[id number] DOB: 12/28/1918 DOA: 04/30/2020 LOS: 3 days  PCP: Sarah Ferretti, DO CC:  Chief Complaint  Patient presents with  . Altered Mental Status       Hospital Summary   This is a 86 year old female with a history of atrial fibrillation, CVA with residual aphasia, hypertension who presented from SNF with altered mental status since this AM and productive cough x 1 day.  History obtained from daughter at bedside as patient was unable to provide history currently.  She was reportedly doing well yesterday with the exception of a productive cough and this a.m. 1 family visited her at the SNF she was awake but not responsive very lethargic prompting ED visit.  ED labs and course: WBC 16.3, lactic acid 2.2, K3.0.  UA negative.  CT head without acute abnormality and with chronic left MCA infarct, CXR: Improved bilateral interstitial pulmonary opacities.  Given 1 L NS bolus and broad-spectrum antibiotics. Palliative care consulted from ED  Evaluated by palliative care, plan to have hospice services follow patient once discharged.  5/29: patient was getting ready for discharge however had worsening secretions and seemed to be having discomfort. Family requested patient to remain inpatient and transition to comfort measures. Comfort measures started.   A & P   Active Problems:   Sepsis (HCC)   Goals of care, counseling/discussion   Palliative care by specialist   DNR (do not resuscitate)   Acute metabolic encephalopathy due to Sepsis secondary to CAP a. Completed 4/5 days ceftriaxone/azithromycin b. Was doing well up until today with increased secretions and discomfort, discharge was cancelled c. Stop IV antibiotics and transition to comfort measures  Failure to thrive, multifactorial: sepsis, dementia  Atrial fibrillation  Right heel stage I pressure ulcer, POA  Depression  Dementia with history of  sundowning  Hypokalemia  Hypomagnesemia  History of CVA with residual aphasia, appears at baseline  History of diastolic heart failure  History of iron deficiency anemia on supplement  DVT prophylaxis: none Family Communication: Patient's daughter at bedside has been updated today Disposition Plan:  Status is: Inpatient  Remains inpatient appropriate because:IV treatments appropriate due to intensity of illness or inability to take PO and Awaiting hospice bed delivery to facility plan for discharge tomorrow   Dispo: The patient is from: SNF              Anticipated d/c is to: hospital death              Anticipated d/c date is: 2 days              Patient currently is not medically stable to d/c.     Pressure injury documentation   Pressure Injury 05/24/20 Heel Right Deep Tissue Pressure Injury - Purple or maroon localized area of discolored intact skin or blood-filled blister due to damage of underlying soft tissue from pressure and/or shear. (Active)  05/24/20 0245  Location: Heel  Location Orientation: Right  Staging: Deep Tissue Pressure Injury - Purple or maroon localized area of discolored intact skin or blood-filled blister due to damage of underlying soft tissue from pressure and/or shear.  Wound Description (Comments):   Present on Admission: Yes     Consultants  Palliative care   Procedures  None  Antibiotics   Anti-infectives (From admission, onward)   Start     Dose/Rate Route Frequency Ordered Stop   06/01/2020 0000  cefpodoxime (  VANTIN) 100 MG tablet     100 mg Oral 2 times daily 05/25/20 1257 05/28/20 2359   05/20/2020 0000  azithromycin (ZITHROMAX) 250 MG tablet     250 mg Oral Daily 05/25/20 1257 05/28/20 2359   05/24/20 1000  cefTRIAXone (ROCEPHIN) 2 g in sodium chloride 0.9 % 100 mL IVPB  Status:  Discontinued     2 g 200 mL/hr over 30 Minutes Intravenous Every 24 hours 05/27/2020 1811 04/30/2020 1411   May 27, 2020 2000  azithromycin (ZITHROMAX) 500  mg in sodium chloride 0.9 % 250 mL IVPB  Status:  Discontinued     500 mg 250 mL/hr over 60 Minutes Intravenous Every 24 hours May 27, 2020 1811 05/10/2020 1411   2020/05/27 1700  ceFEPIme (MAXIPIME) 2 g in sodium chloride 0.9 % 100 mL IVPB     2 g 200 mL/hr over 30 Minutes Intravenous  Once 05/27/2020 1646 05/27/2020 2145   2020/05/27 1700  metroNIDAZOLE (FLAGYL) IVPB 500 mg  Status:  Discontinued     500 mg 100 mL/hr over 60 Minutes Intravenous  Once 2020-05-27 1646 05/27/20 1811   May 27, 2020 1700  vancomycin (VANCOCIN) IVPB 1000 mg/200 mL premix  Status:  Discontinued     1,000 mg 200 mL/hr over 60 Minutes Intravenous  Once 05/27/20 1646 2020/05/27 1811        Subjective   Patient was doing well this morning and getting ready for discharge but throughout the day started having increasing respiratory secretions. Scopolamine patch was started but patient did not have much response with this. Also began showing signs of discomfort and was given a dose of morphine. Daughter mentioned that the patient would not be seen by hospice at her SNF until Tuesday, after the holiday weekend, and was concerned that the patient would be uncomfortable and pass away prior to their arrival. Goals of care discussion with daughter at bedside resulted in agreement to hold off on discharge and change to comfort measures.   Objective   Vitals:   05/25/20 1350 05/25/20 2010 05/27/2020 0444 04/28/2020 1351  BP:  130/80 114/68 113/89  Pulse:  (!) 104 75   Resp:  20 20   Temp:  98 F (36.7 C) 99.8 F (37.7 C)   TempSrc:  Oral Oral   SpO2: 93% 93% 95%   Weight:      Height:        Intake/Output Summary (Last 24 hours) at 05/06/2020 1445 Last data filed at 05/07/2020 1122 Gross per 24 hour  Intake 450 ml  Output 200 ml  Net 250 ml   Filed Weights   05/24/20 0150  Weight: 40.3 kg    Examination:  Physical Exam Vitals and nursing note reviewed. Exam conducted with a chaperone present.  Constitutional:      Comments:  Frail elderly female  Eyes:     Conjunctiva/sclera: Conjunctivae normal.  Cardiovascular:     Rate and Rhythm: Normal rate and regular rhythm.  Pulmonary:     Breath sounds: No wheezing.     Comments: Audible respiratory secretions Abdominal:     General: There is no distension.  Musculoskeletal:        General: No swelling or deformity.  Skin:    Coloration: Skin is not jaundiced or pale.  Neurological:     Comments: somnolent     Data Reviewed: I have personally reviewed following labs and imaging studies  CBC: Recent Labs  Lab May 27, 2020 1222 05/24/20 0516 05/25/20 0505  WBC 16.3* 15.2* 9.8  HGB  13.8 10.5* 10.5*  HCT 40.4 31.6* 31.7*  MCV 103.6* 101.9* 99.7  PLT 317 306 302   Basic Metabolic Panel: Recent Labs  Lab 06/16/20 1222 05/24/20 0516 05/25/20 0505 05/25/20 1102  NA 137 139 138 140  K 3.0* 2.8* 2.8* 3.4*  CL 97* 102 102 103  CO2 25 24 27 26   GLUCOSE 120* 106* 79 76  BUN 18 19 14 13   CREATININE 0.73 0.59 0.54 0.54  CALCIUM 8.2* 7.7* 7.8* 7.8*  MG 1.8 1.6* 1.8  --    GFR: Estimated Creatinine Clearance: 23.8 mL/min (by C-G formula based on SCr of 0.54 mg/dL). Liver Function Tests: Recent Labs  Lab 06-16-2020 1222  AST 28  ALT 21  ALKPHOS 122  BILITOT 1.3*  PROT 7.1  ALBUMIN 3.0*   No results for input(s): LIPASE, AMYLASE in the last 168 hours. No results for input(s): AMMONIA in the last 168 hours. Coagulation Profile: Recent Labs  Lab 05/24/20 0516  INR 2.5*   Cardiac Enzymes: No results for input(s): CKTOTAL, CKMB, CKMBINDEX, TROPONINI in the last 168 hours. BNP (last 3 results) No results for input(s): PROBNP in the last 8760 hours. HbA1C: No results for input(s): HGBA1C in the last 72 hours. CBG: No results for input(s): GLUCAP in the last 168 hours. Lipid Profile: No results for input(s): CHOL, HDL, LDLCALC, TRIG, CHOLHDL, LDLDIRECT in the last 72 hours. Thyroid Function Tests: Recent Labs    05/24/20 0516  TSH 5.277*    Anemia Panel: No results for input(s): VITAMINB12, FOLATE, FERRITIN, TIBC, IRON, RETICCTPCT in the last 72 hours. Sepsis Labs: Recent Labs  Lab 06/16/20 1517 05/24/20 0516  PROCALCITON  --  0.26  LATICACIDVEN 2.2* 1.7    Recent Results (from the past 240 hour(s))  SARS Coronavirus 2 by RT PCR (hospital order, performed in [facility] hospital lab) Nasopharyngeal Nasopharyngeal Swab     Status: None   Collection Time: Jun 16, 2020 12:49 PM   Specimen: Nasopharyngeal Swab  Result Value Ref Range Status   SARS Coronavirus 2 NEGATIVE NEGATIVE Final    Comment: (NOTE) SARS-CoV-2 target nucleic acids are NOT DETECTED. The SARS-CoV-2 RNA is generally detectable in upper and lower respiratory specimens during the acute phase of infection. The lowest concentration of SARS-CoV-2 viral copies this assay can detect is 250 copies / mL. A negative result does not preclude SARS-CoV-2 infection and should not be used as the sole basis for treatment or other patient management decisions.  A negative result may occur with improper specimen collection / handling, submission of specimen other than nasopharyngeal swab, presence of viral mutation(s) within the areas targeted by this assay, and inadequate number of viral copies (<250 copies / mL). A negative result must be combined with clinical observations, patient history, and epidemiological information. Fact Sheet for Patients:   [facility] Fact Sheet for Healthcare Providers: 05/25/20 This test is not yet approved or cleared  by the [employer] FDA and has been authorized for detection and/or diagnosis of SARS-CoV-2 by FDA under an Emergency Use Authorization (EUA).  This EUA will remain in effect (meaning this test can be used) for the duration of the COVID-19 declaration under Section 564(b)(1) of the Act, 21 U.S.C. section 360bbb-3(b)(1), unless the authorization is terminated  or revoked sooner. Performed at [facility], [address], [place], [place]   Blood culture (routine x 2)     Status: None (Preliminary result)   Collection Time: 16-Jun-2020  3:16 PM   Specimen: BLOOD  Result Value Ref Range Status   Specimen Description   Final    BLOOD RIGHT ANTECUBITAL Performed at [facility], [address] [place], [place] [address]    Special Requests   Final    BOTTLES DRAWN AEROBIC ONLY Blood Culture results may not be optimal due to an inadequate volume of blood received in culture bottles Performed at [facility], [address] [place], [place] [address]    Culture   Final    NO GROWTH 3 DAYS Performed at [facility], [address] [place], [place] [address]    Report Status PENDING  Incomplete  Blood culture (routine x 2)     Status: None (Preliminary result)   Collection Time: 05/06/2020  3:24 PM   Specimen: BLOOD RIGHT FOREARM  Result Value Ref Range Status   Specimen Description   Final    BLOOD RIGHT FOREARM Performed at [facility], [address] [place], [place] [address]    Special Requests   Final    BOTTLES DRAWN AEROBIC ONLY Blood Culture results may not be optimal due to an inadequate volume of blood received in culture bottles Performed at [facility], [address] [place], [place] [address]    Culture   Final    NO GROWTH 3 DAYS Performed at [facility], [address] [place], [place] [address]    Report Status PENDING  Incomplete  MRSA PCR Screening     Status: None   Collection Time: 05/01/2020 11:02 PM   Specimen: Nasal Mucosa; Nasopharyngeal  Result Value Ref Range Status   MRSA by PCR NEGATIVE NEGATIVE Final    Comment:        The GeneXpert MRSA Assay (FDA approved for NASAL specimens only), is one component of a comprehensive MRSA colonization surveillance program. It is not intended to  diagnose MRSA infection nor to guide or monitor treatment for MRSA infections. Performed at [facility], [address] [place], [place] [address]   SARS CORONAVIRUS 2 (TAT 6-24 HRS) Nasopharyngeal Nasopharyngeal Swab     Status: None   Collection Time: 05/25/20 11:27 AM   Specimen: Nasopharyngeal Swab  Result Value Ref Range Status   SARS Coronavirus 2 NEGATIVE NEGATIVE Final    Comment: (NOTE) SARS-CoV-2 target nucleic acids are NOT DETECTED. The SARS-CoV-2 RNA is generally detectable in upper and lower respiratory specimens during the acute phase of infection. Negative results do not preclude SARS-CoV-2 infection, do not rule out co-infections with other pathogens, and should not be used as the sole basis for treatment or other patient management decisions. Negative results must be combined with clinical observations, patient history, and epidemiological information. The expected result is Negative. Fact Sheet for Patients: HairSlick.nohttps://www.fda.gov/media/138098/download Fact Sheet for Healthcare Providers: quierodirigir.comhttps://www.fda.gov/media/138095/download This test is not yet approved or cleared by the [place] FDA and  has been authorized for detection and/or diagnosis of SARS-CoV-2 by FDA under an Emergency Use Authorization (EUA). This EUA will remain  in effect (meaning this test can be used) for the duration of the COVID-19 declaration under Section 56 4(b)(1) of the Act, 21 U.S.C. section 360bbb-3(b)(1), unless the authorization is terminated or revoked sooner. Performed at [facility], [address] [place], [place] [address]          Radiology Studies: No results found.      Scheduled Meds: . mouth rinse  15 mL Mouth Rinse BID  .  scopolamine  1 patch Transdermal Q72H  . sodium chloride flush  3 mL Intravenous Q12H   Continuous Infusions: . morphine       Time spent: 50 minutes with over 50% of the time coordinating the  patient's care    Harold Hedge, DO [facility] Pager [phone]  Call night coverage person covering after 7pm

## 2020-05-29 NOTE — Significant Event (Signed)
Prior to being transferred to facility, the patient continued to have worsening respiratory secretions and appeared in discomfort. After further discussion with the daughter at bedside, she mentioned that the patient will not be seen by Hospice services at her facility until Tuesday and she is concerned her mother will pass away prior to obtaining these services. She is concerned about her comfort and is requesting to switch to comfort measures which at this point I believe is appropriate.  Will discontinue discharge and transition to comfort measures only.  Whitney Post, DO Full note to follow

## 2020-05-29 NOTE — Discharge Summary (Deleted)
Physician Discharge Summary  Sarah BaumannRose Stone ZOX:[id number] DOB: 02/25/1918   PCP: Sarah FerrettiSkakle, Sarah Stone  Admit date: 05/19/2020 Discharge date: 2020-09-07 Length of Stay: 3 days   Code Status: DNR  Admitted From: SNF Discharged to:  [facility] with Hospice services Discharge Condition: poor prognosis  Recommendations for Outpatient Follow-up   1. Has one more day of PO antibiotics, she is afebrile on room air with resolved leukocytosis and received 4/5 days IV antibiotics. Ok to hold PO antibiotics if she is not tolerating PO intake 2. Hospice services at facility for symptomatic management  Hospital Summary  This is a 86 year old female with a history of atrial fibrillation, CVA with residual aphasia, hypertension who presented from SNF with altered mental status since this AM and productive cough x 1 day.History obtained from daughter at bedside as patient was unable to provide history currently. She was reportedly doing well yesterday with the exception of a productive cough and this a.m. 1 family visited her at the SNF she was awake but not responsive very lethargic prompting ED visit.  ED labs and course: WBC 16.3, lactic acid 2.2, K3.0. UA negative. CT head without acute abnormality and withchronic left MCA infarct, CXR: Improved bilateral interstitial pulmonary opacities. Given 1 L NS bolus and broad-spectrum antibiotics.Palliative care consulted from ED  Evaluated by palliative care  She has since had resolution of her fever and leukocytosis. Discharged on 5/29 with PO antibiotics to complete course for pneumonia treatment and scopolamine patch for secretions. Hospice Services to follow.   A & P   Active Problems:   Sepsis (HCC)   Goals of care, counseling/discussion   Palliative care by specialist   DNR (Stone not resuscitate)    Acute metabolic encephalopathy due toSepsis secondary to CAP resolved a. Completed 4/5 days  Ceftriaxone/Azithromycin b. Discharged with Vantin and Azithromycin PO to complete treatment. As the patient has had resolution of her fevers, leukocytosis, encephalopathy and with normal vital signs and since she is now being transitioned to Hospice, if she is unable to tolerate PO intake then her PO antibiotics can be held. She is intermittently tolerating PO intake.   Failure to thrive, multifactorial: sepsis, dementia a. Plan as above b. Nutrition consulted  Atrial fibrillation, rate controlled a. Continue home beta-blocker b. Continue Eliquis  Right heel stage I pressure ulcer, POA a. Wound care  Depression a. Continue SSRI  Dementia with history of sundowning a. Continue Seroquel nightly at 25 mg  Hypokalemia a. resolved  Hypomagnesemia a. Resolved  History of CVA with residual aphasia, appears at baseline  History of diastolic heart failure a. Hold Lasix for now due to hypokalemia and euvolemic  History of iron deficiency anemia on supplement    Consultants  . palliative  Procedures  . none  Antibiotics   Anti-infectives (From admission, onward)   Start     Dose/Rate Route Frequency Ordered Stop   Jan 28, 2020 0000  cefpodoxime (VANTIN) 100 MG tablet     100 mg Oral 2 times daily 05/25/20 1257 05/28/20 2359   Jan 28, 2020 0000  azithromycin (ZITHROMAX) 250 MG tablet     250 mg Oral Daily 05/25/20 1257 05/28/20 2359   05/24/20 1000  cefTRIAXone (ROCEPHIN) 2 g in sodium chloride 0.9 % 100 mL IVPB     2 g 200 mL/hr over 30 Minutes Intravenous Every 24 hours 04/28/2020 1811     05/14/2020 2000  azithromycin (ZITHROMAX) 500 mg in sodium chloride 0.9 % 250 mL IVPB  500 mg 250 mL/hr over 60 Minutes Intravenous Every 24 hours 05/06/2020 1811     05/09/2020 1700  ceFEPIme (MAXIPIME) 2 g in sodium chloride 0.9 % 100 mL IVPB     2 g 200 mL/hr over 30 Minutes Intravenous  Once 05/19/2020 1646 05/25/2020 2145   05/21/2020 1700  metroNIDAZOLE (FLAGYL) IVPB 500 mg  Status:   Discontinued     500 mg 100 mL/hr over 60 Minutes Intravenous  Once 05/04/2020 1646 05/19/2020 1811   04/29/2020 1700  vancomycin (VANCOCIN) IVPB 1000 mg/200 mL premix  Status:  Discontinued     1,000 mg 200 mL/hr over 60 Minutes Intravenous  Once 05/02/2020 1646 05/06/2020 1811       Subjective  Patient is a poor historian given her underlying dementia. Daughter at bedside says patient has improved and plan is anticipating discharge to facility today. No overnight events  Objective   Discharge Exam: Vitals:   05/25/20 2010 2020/05/31 0444  BP: 130/80 114/68  Pulse: (!) 104 75  Resp: 20 20  Temp: 98 F (36.7 C) 99.8 F (37.7 C)  SpO2: 93% 95%   Vitals:   05/25/20 1318 05/25/20 1350 05/25/20 2010 2020-05-31 0444  BP: (!) 143/75  130/80 114/68  Pulse: 71  (!) 104 75  Resp: 20  20 20   Temp: 98.2 F (36.8 C)  98 F (36.7 C) 99.8 F (37.7 C)  TempSrc: Oral  Oral Oral  SpO2:  93% 93% 95%  Weight:      Height:        Physical Exam Vitals and nursing note reviewed. Exam conducted with a chaperone present.  Constitutional:      General: She is not in acute distress.    Appearance: Normal appearance.     Comments: Frail elderly female  HENT:     Head: Normocephalic and atraumatic.  Eyes:     Conjunctiva/sclera: Conjunctivae normal.  Cardiovascular:     Rate and Rhythm: Normal rate and regular rhythm.  Pulmonary:     Effort: Pulmonary effort is normal.     Comments: Respiratory secretions Abdominal:     General: Abdomen is flat.     Palpations: Abdomen is soft.  Musculoskeletal:        General: No swelling or tenderness.  Skin:    Coloration: Skin is not jaundiced or pale.  Neurological:     Mental Status: She is alert. Mental status is at baseline.       The results of significant diagnostics from this hospitalization (including imaging, microbiology, ancillary and laboratory) are listed below for reference.     Microbiology: Recent Results (from the past 240  hour(s))  SARS Coronavirus 2 by RT PCR (hospital order, performed in [facility] hospital lab) Nasopharyngeal Nasopharyngeal Swab     Status: None   Collection Time: 05/21/2020 12:49 PM   Specimen: Nasopharyngeal Swab  Result Value Ref Range Status   SARS Coronavirus 2 NEGATIVE NEGATIVE Final    Comment: (NOTE) SARS-CoV-2 target nucleic acids are NOT DETECTED. The SARS-CoV-2 RNA is generally detectable in upper and lower respiratory specimens during the acute phase of infection. The lowest concentration of SARS-CoV-2 viral copies this assay can detect is 250 copies / mL. A negative result does not preclude SARS-CoV-2 infection and should not be used as the sole basis for treatment or other patient management decisions.  A negative result may occur with improper specimen collection / handling, submission of specimen other than nasopharyngeal swab, presence of viral mutation(s) within  the areas targeted by this assay, and inadequate number of viral copies (<250 copies / mL). A negative result must be combined with clinical observations, patient history, and epidemiological information. Fact Sheet for Patients:   StrictlyIdeas.no Fact Sheet for Healthcare Providers: BankingDealers.co.za This test is not yet approved or cleared  by the [place] FDA and has been authorized for detection and/or diagnosis of SARS-CoV-2 by FDA under an Emergency Use Authorization (EUA).  This EUA will remain in effect (meaning this test can be used) for the duration of the COVID-19 declaration under Section 564(b)(1) of the Act, 21 U.S.C. section 360bbb-3(b)(1), unless the authorization is terminated or revoked sooner. Performed at [facility], [place] [address], [place], [place] [address]   Blood culture (routine x 2)     Status: None (Preliminary result)   Collection Time: 09-Jun-2020  3:16 PM   Specimen: BLOOD  Result Value Ref Range Status    Specimen Description   Final    BLOOD RIGHT ANTECUBITAL Performed at [facility] [address] [place], [place] [address]    Special Requests   Final    BOTTLES DRAWN AEROBIC ONLY Blood Culture results may not be optimal due to an inadequate volume of blood received in culture bottles Performed at [facility] [address], [place], [place] [address]    Culture   Final    NO GROWTH 3 DAYS Performed at [facility], [place] [address] [place], [place] [address]    Report Status PENDING  Incomplete  Blood culture (routine x 2)     Status: None (Preliminary result)   Collection Time: 2020-06-09  3:24 PM   Specimen: BLOOD RIGHT FOREARM  Result Value Ref Range Status   Specimen Description   Final    BLOOD RIGHT FOREARM Performed at [place] [address] [place], [place] [address]    Special Requests   Final    BOTTLES DRAWN AEROBIC ONLY Blood Culture results may not be optimal due to an inadequate volume of blood received in culture bottles Performed at [place] [address] [place], [place] [address]    Culture   Final    NO GROWTH 3 DAYS Performed at [facility], [place] [address], [place],  [address]    Report Status PENDING  Incomplete  MRSA PCR Screening     Status: None   Collection Time: 09-Jun-2020 11:02 PM   Specimen: Nasal Mucosa; Nasopharyngeal  Result Value Ref Range Status   MRSA by PCR NEGATIVE NEGATIVE Final    Comment:        The GeneXpert MRSA Assay (FDA approved for NASAL specimens only), is one component of a comprehensive MRSA colonization surveillance program. It is not intended to diagnose MRSA infection nor to guide or monitor treatment for MRSA infections. Performed at [facility], [place] [address] [place], [place] [address]   SARS CORONAVIRUS 2 (TAT 6-24 HRS) Nasopharyngeal Nasopharyngeal Swab     Status: None    Collection Time: 05/25/20 11:27 AM   Specimen: Nasopharyngeal Swab  Result Value Ref Range Status   SARS Coronavirus 2 NEGATIVE NEGATIVE Final    Comment: (NOTE) SARS-CoV-2 target nucleic acids are NOT DETECTED. The SARS-CoV-2 RNA is generally detectable in upper and lower respiratory specimens during the acute phase of infection. Negative results Stone not preclude SARS-CoV-2 infection, Stone not rule out co-infections with other pathogens, and should not be used  as the sole basis for treatment or other patient management decisions. Negative results must be combined with clinical observations, patient history, and epidemiological information. The expected result is Negative. Fact Sheet for Patients: HairSlick.no Fact Sheet for Healthcare Providers: quierodirigir.com This test is not yet approved or cleared by the [place] FDA and  has been authorized for detection and/or diagnosis of SARS-CoV-2 by FDA under an Emergency Use Authorization (EUA). This EUA will remain  in effect (meaning this test can be used) for the duration of the COVID-19 declaration under Section 56 4(b)(1) of the Act, 21 U.S.C. section 360bbb-3(b)(1), unless the authorization is terminated or revoked sooner. Performed at [facility], [address] [place], [place] [address]      Labs: BNP (last 3 results) No results for input(s): BNP in the last 8760 hours. Basic Metabolic Panel: Recent Labs  Lab 2020-06-02 1222 05/24/20 0516 05/25/20 0505 05/25/20 1102  NA 137 139 138 140  K 3.0* 2.8* 2.8* 3.4*  CL 97* 102 102 103  CO2 25 24 27 26   GLUCOSE 120* 106* 79 76  BUN 18 19 14 13   CREATININE 0.73 0.59 0.54 0.54  CALCIUM 8.2* 7.7* 7.8* 7.8*  MG 1.8 1.6* 1.8  --    Liver Function Tests: Recent Labs  Lab 2020/06/02 1222  AST 28  ALT 21  ALKPHOS 122  BILITOT 1.3*  PROT 7.1  ALBUMIN 3.0*   No results for input(s): LIPASE, AMYLASE in the last  168 hours. No results for input(s): AMMONIA in the last 168 hours. CBC: Recent Labs  Lab 06/02/2020 1222 05/24/20 0516 05/25/20 0505  WBC 16.3* 15.2* 9.8  HGB 13.8 10.5* 10.5*  HCT 40.4 31.6* 31.7*  MCV 103.6* 101.9* 99.7  PLT 317 306 302   Cardiac Enzymes: No results for input(s): CKTOTAL, CKMB, CKMBINDEX, TROPONINI in the last 168 hours. BNP: Invalid input(s): POCBNP CBG: No results for input(s): GLUCAP in the last 168 hours. D-Dimer No results for input(s): DDIMER in the last 72 hours. Hgb A1c No results for input(s): HGBA1C in the last 72 hours. Lipid Profile No results for input(s): CHOL, HDL, LDLCALC, TRIG, CHOLHDL, LDLDIRECT in the last 72 hours. Thyroid function studies Recent Labs    05/24/20 0516  TSH 5.277*   Anemia work up No results for input(s): VITAMINB12, FOLATE, FERRITIN, TIBC, IRON, RETICCTPCT in the last 72 hours. Urinalysis    Component Value Date/Time   COLORURINE YELLOW 06-02-2020 1456   APPEARANCEUR CLEAR 2020-06-02 1456   LABSPEC 1.011 06-02-20 1456   PHURINE 6.0 06-02-20 1456   GLUCOSEU NEGATIVE Jun 02, 2020 1456   HGBUR NEGATIVE 2020/06/02 1456   BILIRUBINUR NEGATIVE Jun 02, 2020 1456   KETONESUR NEGATIVE June 02, 2020 1456   PROTEINUR NEGATIVE 06/02/2020 1456   UROBILINOGEN 0.2 02/24/2015 0202   NITRITE NEGATIVE 02-Jun-2020 1456   LEUKOCYTESUR NEGATIVE 02-Jun-2020 1456   Sepsis Labs Invalid input(s): PROCALCITONIN,  WBC,  LACTICIDVEN Microbiology Recent Results (from the past 240 hour(s))  SARS Coronavirus 2 by RT PCR (hospital order, performed in [facility] hospital lab) Nasopharyngeal Nasopharyngeal Swab     Status: None   Collection Time: 06-02-20 12:49 PM   Specimen: Nasopharyngeal Swab  Result Value Ref Range Status   SARS Coronavirus 2 NEGATIVE NEGATIVE Final    Comment: (NOTE) SARS-CoV-2 target nucleic acids are NOT DETECTED. The SARS-CoV-2 RNA is generally detectable in upper and lower respiratory specimens during the acute  phase of infection. The lowest concentration of SARS-CoV-2 viral copies this assay can detect is  250 copies / mL. A negative result does not preclude SARS-CoV-2 infection and should not be used as the sole basis for treatment or other patient management decisions.  A negative result may occur with improper specimen collection / handling, submission of specimen other than nasopharyngeal swab, presence of viral mutation(s) within the areas targeted by this assay, and inadequate number of viral copies (<250 copies / mL). A negative result must be combined with clinical observations, patient history, and epidemiological information. Fact Sheet for Patients:   BoilerBrush.com.cy Fact Sheet for Healthcare Providers: https://pope.com/ This test is not yet approved or cleared  by the [place] FDA and has been authorized for detection and/or diagnosis of SARS-CoV-2 by FDA under an Emergency Use Authorization (EUA).  This EUA will remain in effect (meaning this test can be used) for the duration of the COVID-19 declaration under Section 564(b)(1) of the Act, 21 U.S.C. section 360bbb-3(b)(1), unless the authorization is terminated or revoked sooner. Performed at [facility], [address] [place], [place] [address]   Blood culture (routine x 2)     Status: None (Preliminary result)   Collection Time: 05/09/2020  3:16 PM   Specimen: BLOOD  Result Value Ref Range Status   Specimen Description   Final    BLOOD RIGHT ANTECUBITAL Performed at [facility], [address], [place], [place] [address]    Special Requests   Final    BOTTLES DRAWN AEROBIC ONLY Blood Culture results may not be optimal due to an inadequate volume of blood received in culture bottles Performed at [facility], [address], [place], [place] [address]    Culture   Final    NO GROWTH 3 DAYS Performed at [facility], [address], [place], [place] [address]    Report Status PENDING  Incomplete  Blood culture (routine x 2)     Status: None (Preliminary result)   Collection Time: 05/12/2020  3:24 PM   Specimen: BLOOD RIGHT FOREARM  Result Value Ref Range Status   Specimen Description   Final    BLOOD RIGHT FOREARM Performed at [facility], [address] [place], [place] [address]    Special Requests   Final    BOTTLES DRAWN AEROBIC ONLY Blood Culture results may not be optimal due to an inadequate volume of blood received in culture bottles Performed at [facility], [address], [place], [place] [address]    Culture   Final    NO GROWTH 3 DAYS Performed at [facility], [address], [place], [place] [address]    Report Status PENDING  Incomplete  MRSA PCR Screening     Status: None   Collection Time: 05/27/2020 11:02 PM   Specimen: Nasal Mucosa; Nasopharyngeal  Result Value Ref Range Status   MRSA by PCR NEGATIVE NEGATIVE Final    Comment:        The GeneXpert MRSA Assay (FDA approved for NASAL specimens only), is one component of a comprehensive MRSA colonization surveillance program. It is not intended to diagnose MRSA infection nor to guide or monitor treatment for MRSA infections. Performed at [facility], [address] [place], [place] [address]   SARS CORONAVIRUS 2 (TAT 6-24 HRS) Nasopharyngeal Nasopharyngeal Swab     Status: None   Collection Time: 05/25/20 11:27 AM   Specimen: Nasopharyngeal Swab  Result Value Ref Range Status   SARS Coronavirus 2 NEGATIVE NEGATIVE  Final    Comment: (NOTE) SARS-CoV-2 target nucleic acids are NOT DETECTED. The SARS-CoV-2 RNA is generally detectable in upper and lower respiratory specimens during the acute phase of infection. Negative results Stone not preclude SARS-CoV-2 infection, Stone not rule out co-infections with other pathogens, and should not be used as  the sole basis for treatment or other patient management decisions. Negative results must be combined with clinical observations, patient history, and epidemiological information. The expected result is Negative. Fact Sheet for Patients: HairSlick.no Fact Sheet for Healthcare Providers: quierodirigir.com This test is not yet approved or cleared by the [place] FDA and  has been authorized for detection and/or diagnosis of SARS-CoV-2 by FDA under an Emergency Use Authorization (EUA). This EUA will remain  in effect (meaning this test can be used) for the duration of the COVID-19 declaration under Section 56 4(b)(1) of the Act, 21 U.S.C. section 360bbb-3(b)(1), unless the authorization is terminated or revoked sooner. Performed at [facility], [address] [place], [place] [address]     Discharge Instructions     Discharge Instructions    Diet - low sodium heart healthy   Complete by: As directed    Diet - low sodium heart healthy   Complete by: As directed    Discharge instructions   Complete by: As directed    - Take Cefpodoxime 100 mg twice daily for the next two days - Take Azithromycin 250 mg daily for the next two days - Follow up with Hospice services at your facility - stop taking Lasix for now. If you have recurrent swelling in your legs you can use compression stockings or discuss with your doctor   Discharge instructions   Complete by: As directed    - Continue Azithromycin and Cefpodoxime to complete treatment for pneumonia - use scopolamine patch as prescribed for secretions - stop taking Lasix - Follow up with Hospice services at your facility   Increase activity slowly   Complete by: As directed    Increase activity slowly   Complete by: As directed      Allergies as of 06-24-2020   No Known Allergies     Medication List    STOP taking these medications   furosemide 40 MG  tablet Commonly known as: LASIX     TAKE these medications   apixaban 2.5 MG Tabs tablet Commonly known as: ELIQUIS Take 1 tablet (2.5 mg total) by mouth 2 (two) times daily.   atenolol 25 MG tablet Commonly known as: TENORMIN Take 25 mg by mouth daily.   azithromycin 250 MG tablet Commonly known as: ZITHROMAX Take 1 tablet (250 mg total) by mouth daily for 2 days.   cefpodoxime 100 MG tablet Commonly known as: VANTIN Take 1 tablet (100 mg total) by mouth 2 (two) times daily for 2 days.   dorzolamide 2 % ophthalmic solution Commonly known as: TRUSOPT Place 1 drop into the right eye 2 (two) times daily.   ferrous sulfate 325 (65 FE) MG tablet Take 325 mg by mouth daily with breakfast.   latanoprost 0.005 % ophthalmic solution Commonly known as: XALATAN Place 1 drop into both eyes at bedtime.   QUEtiapine 25 MG tablet Commonly known as: SEROQUEL Take 12.5 mg by mouth at bedtime.   scopolamine 1 MG/3DAYS Commonly known as: TRANSDERM-SCOP Place 1 patch (1.5 mg total) onto the skin every 3 (three) days.   sertraline 50 MG tablet Commonly known as: ZOLOFT Take 50 mg by mouth daily. What  changed: Another medication with the same name was removed. Continue taking this medication, and follow the directions you see here.   timolol 0.5 % ophthalmic solution Commonly known as: TIMOPTIC Place 1 drop into both eyes 2 (two) times daily.       No Known Allergies  Dispo: The patient is from: SNF              Anticipated d/c is to: SNF              Anticipated d/c date is: today              Patient currently is medically stable to d/c.        Time coordinating discharge: Over 30 minutes   SIGNED:   Jae Dire, D.O. [facility] Pager: [phone]  06/17/2020, 11:10 AM

## 2020-05-29 NOTE — TOC Progression Note (Signed)
Transition of Care [facility]) - Progression Note    Patient Details  Name: Sarah Stone MRN: [id number] Date of Birth: 06/12/1918  Transition [facility] CM/SW Contact  [address], [place], [place] Phone Number: 06-09-20, 11:03 AM  Clinical Narrative:    Patient to discharge to [facility] care. Patient to be transported by EMS and will be going in to room C-10. Report to be called in to [place] at [phone]. Fl2 to be faxed before discharge.    Jansen Goodpasture, LCSW Transitions of Care [phone]   Expected Discharge Plan: Memory Care Barriers to Discharge: Continued Medical Work up  Expected Discharge Plan and Services Expected Discharge Plan: Memory Care   Discharge Planning Services: CM Consult Post Acute Care Choice: Hospice(ALF-memory care)                             HH Arranged: RN [facility] Agency: [facility] Date [facility] Agency Contacted: 05/25/20 Time HH Agency Contacted: 1203 Representative spoke with at [facility] Agency: [facility] Bamberg   Social Determinants of Health (SDOH) Interventions    Readmission Risk Interventions No flowsheet data found.

## 2020-05-29 NOTE — TOC Progression Note (Addendum)
Transition of Care [facility]) - Progression Note    Patient Details  Name: Sarah Stone MRN: [id number] Date of Birth: 1918/10/17  Transition of Care [facility]) CM/SW Contact  Brenly Trawick [place], [place] Phone Number: 04/30/2020, 2:09 PM  Clinical Narrative:    Discharge cancelled for today-per RN family moving more towards comfort measures. [facility] contacted as well as [facility] Bam[facility], Hospice Liaison  and informed of cancelled discharge.   Tishanna Dunford [address] [place] Transition of Care [phone]   Expected Discharge Plan: Memory Care Barriers to Discharge: Continued Medical Work up  Expected Discharge Plan and Services Expected Discharge Plan: Memory Care   Discharge Planning Services: CM Consult Post Acute Care Choice: Hospice(ALF-memory care)   Expected Discharge Date: 05/03/2020                         HH Arranged: RN [facility] Agency: [facility] Date HH Agency Contacted: 05/25/20 Time HH Agency Contacted: 1203 Representative spoke with at [facility] Agency: Wallis Bamberg   Social Determinants of Health (SDOH) Interventions    Readmission Risk Interventions No flowsheet data found.

## 2020-05-29 DEATH — deceased

## 2020-06-05 NOTE — Discharge Summary (Signed)
Death Summary  Sarah Stone BTD:[id number] DOB: 1918/09/14 DOA: 06-12-2020  PCP: Charlane Ferretti, DO PCP/Office notified: no  Admit date: June 12, 2020 Date of Death: 06-25-20  Final Diagnoses:  Active Problems:   Sepsis (HCC)   Goals of care, counseling/discussion   Palliative care by specialist   DNR (do not resuscitate)  Hospital Course:  This is a 86 year old female with a history of atrial fibrillation, CVA with residual aphasia, hypertension who presented from SNF with altered mental status since this AM and productive cough x 1 day.History obtained from daughter at bedside as patient was unable to provide history currently. She was reportedly doing well yesterday with the exception of a productive cough and this a.m. 1 family visited her at the SNF she was awake but not responsive very lethargic prompting ED visit.  ED labs and course: WBC 16.3, lactic acid 2.2, K3.0. UA negative. CT head without acute abnormality and withchronic left MCA infarct, CXR: Improved bilateral interstitial pulmonary opacities. Given 1 L NS bolus and broad-spectrum antibiotics.Palliative care consulted from ED  Evaluated by palliative care, plan to have hospice services follow patient once discharged.  5/29: patient was getting ready for discharge however had worsening secretions and seemed to be having discomfort. Family requested patient to remain inpatient and transition to comfort measures. Comfort measures started.  Patient pronounced deceased at 2214-04-29 on 2020/06/16 and death certificate filled out by Dr. Robb Matar.    Acute metabolic encephalopathy due toSepsis secondary to CAP a. Completed 4/5 days ceftriaxone/azithromycin  Failure to thrive, multifactorial: sepsis, dementia  Atrial fibrillation  Right heel stage I pressure ulcer, POA  Depression  Dementia with history of sundowning  Hypokalemia  Hypomagnesemia  History of CVA with residual aphasia, appears at  baseline  History of diastolic heart failure  History of iron deficiency anemia on supplement  Signed:  Jae Dire  [facility] Jun 25, 2020, 10:28 AM

## 2020-10-26 IMAGING — DX DG CHEST 1V PORT
2 series · 2 of 2 positions shown · non-contrast
Comparison: Chest radiograph 05/20/2017

CLINICAL DATA: Decreased alertness.

EXAM:
PORTABLE CHEST 1 VIEW

[chest ap (1 of 2)]
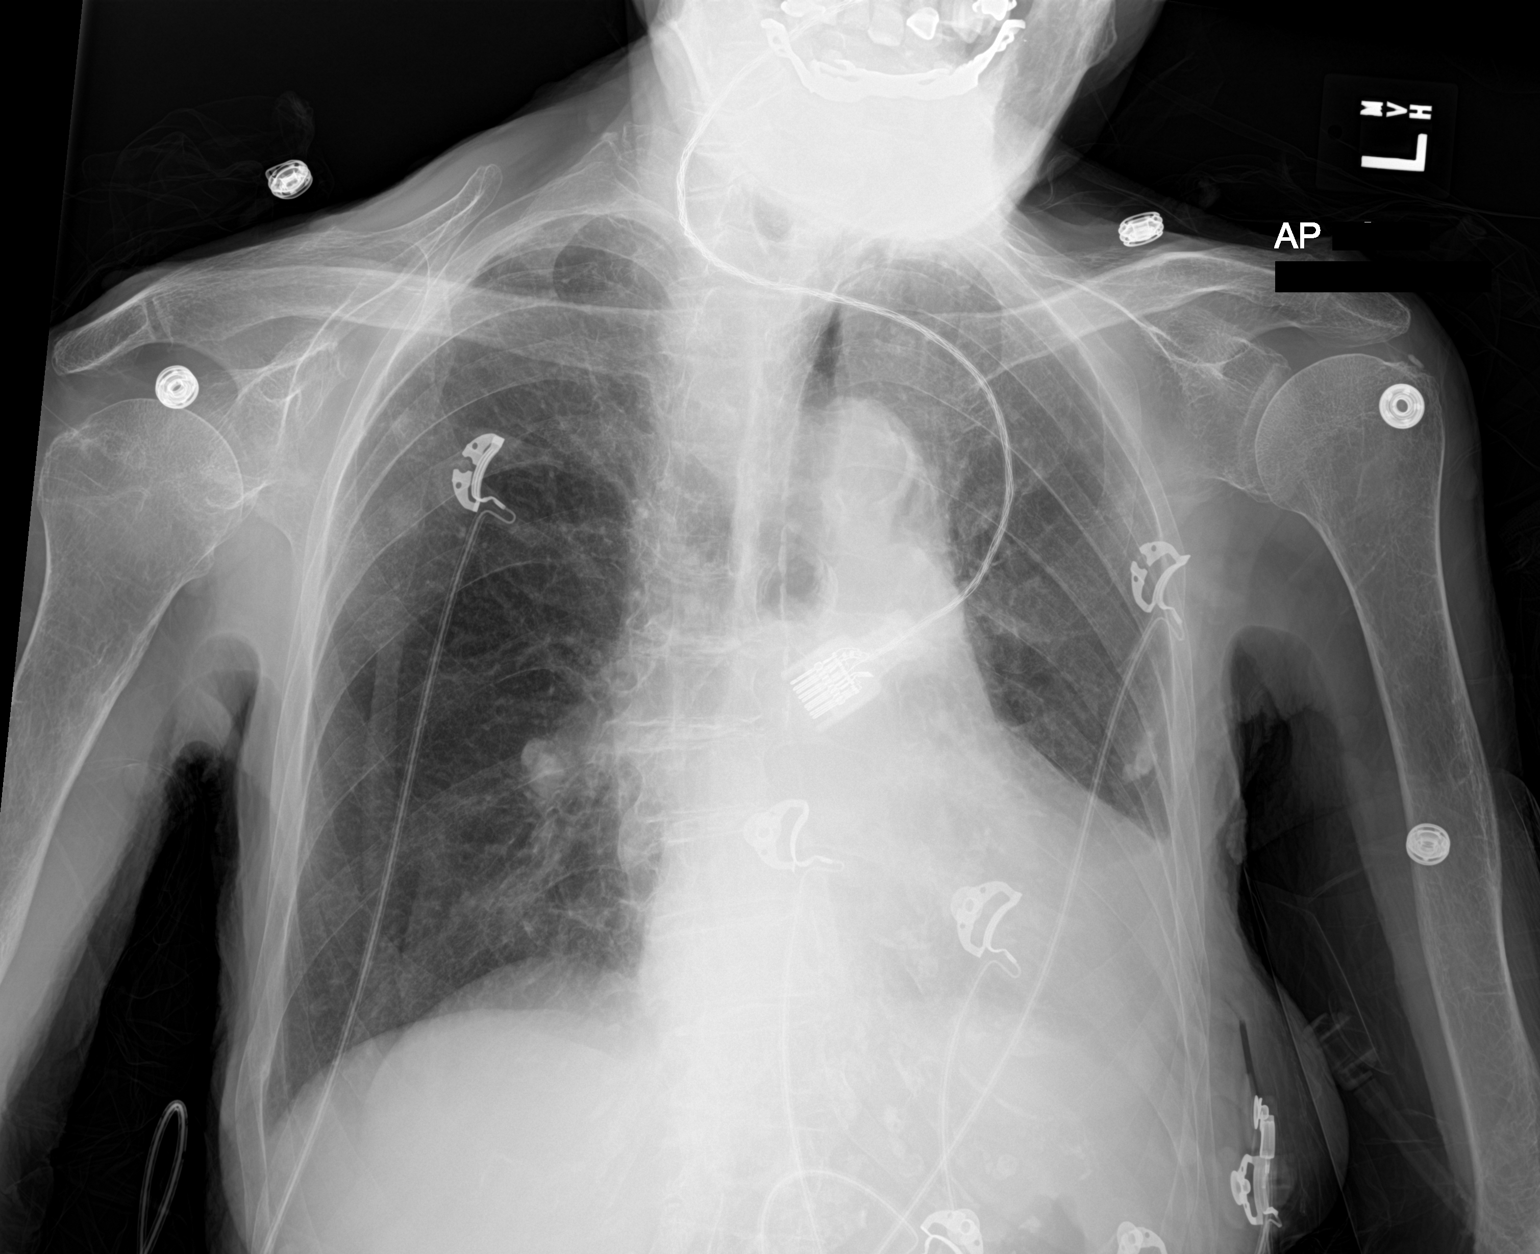

[chest ap (2 of 2)]
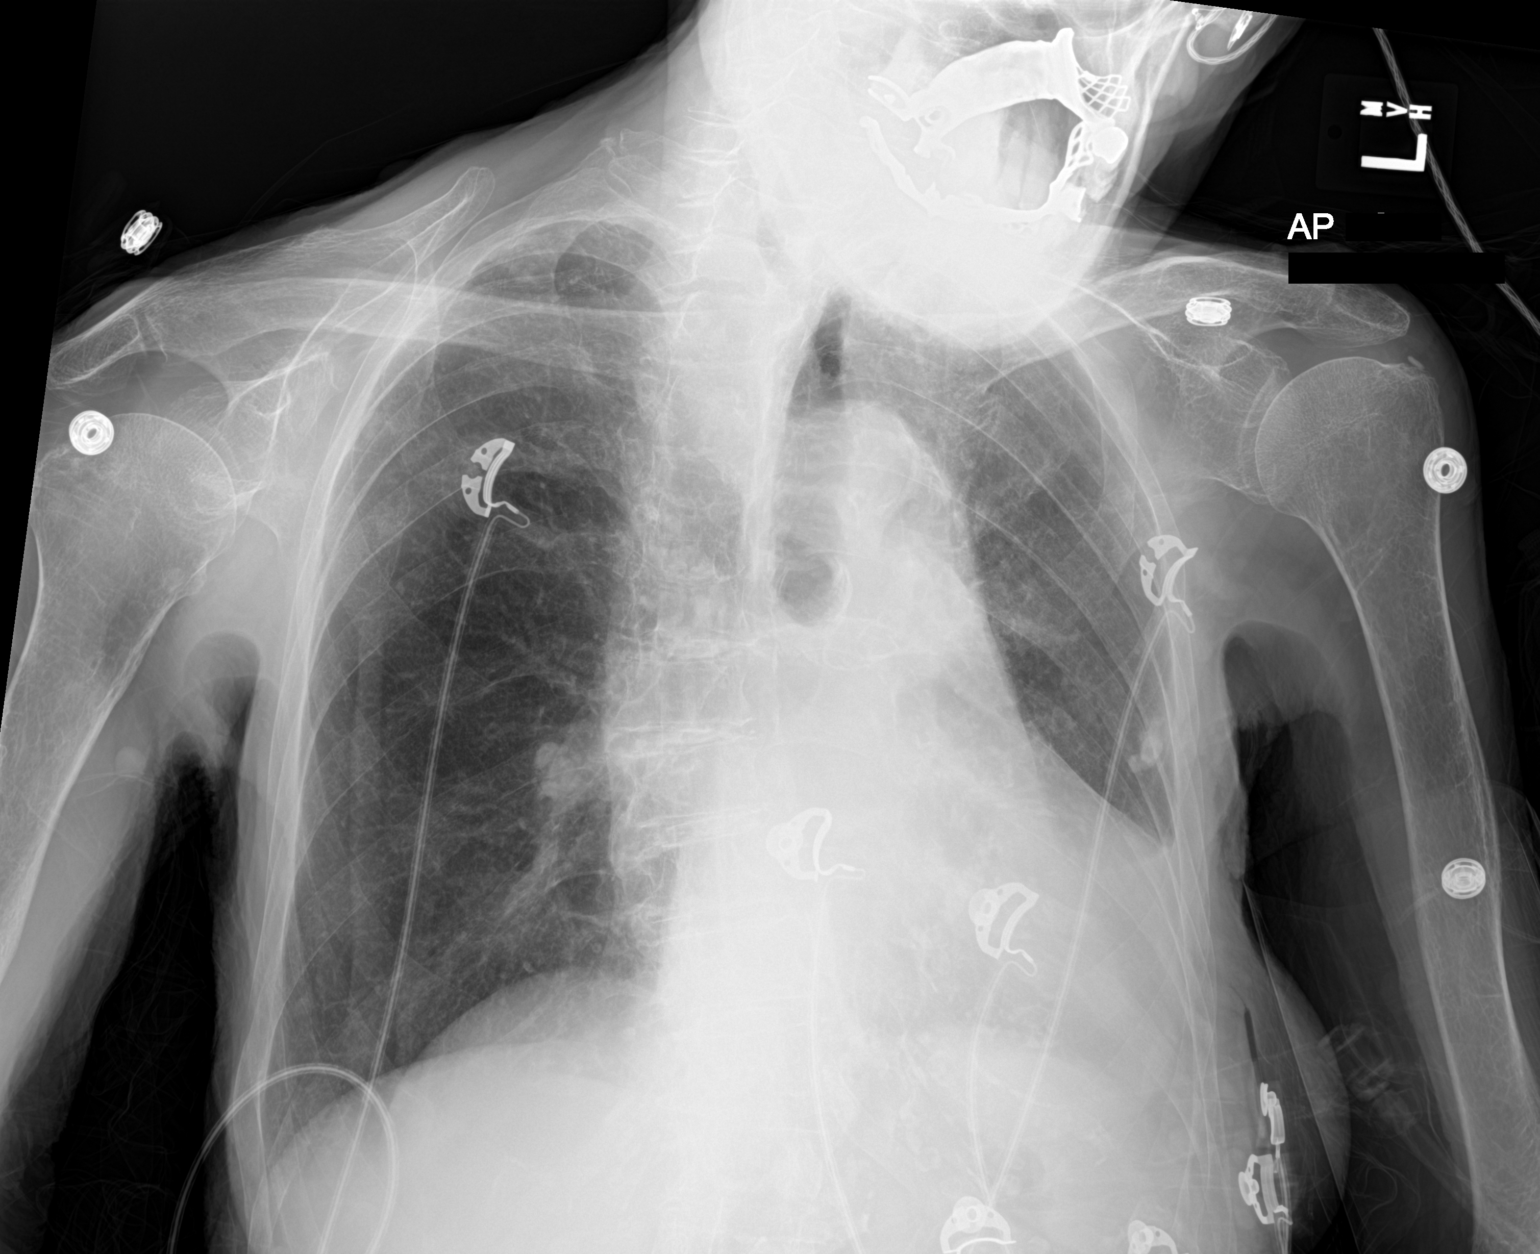

[2 of 2 positions shown; findings below may reference images not displayed]

FINDINGS: Monitoring leads overlie the patient. Patient is mildly rotated.
Stable enlarged cardiac and mediastinal contours. No large area
pulmonary consolidation. No pleural effusion or pneumothorax.
Thoracic spine degenerative changes.
IMPRESSION: Improved bilateral interstitial pulmonary opacities.
